# Patient Record
Sex: Female | Born: 1935
Health system: Southern US, Community
[De-identification: ages and names within clinical notes are randomized; demographics above are authoritative.]

## PROBLEM LIST (undated history)

## (undated) DIAGNOSIS — K449 Diaphragmatic hernia without obstruction or gangrene: Secondary | ICD-10-CM

## (undated) DIAGNOSIS — E785 Hyperlipidemia, unspecified: Secondary | ICD-10-CM

## (undated) DIAGNOSIS — I10 Essential (primary) hypertension: Secondary | ICD-10-CM

## (undated) DIAGNOSIS — J189 Pneumonia, unspecified organism: Secondary | ICD-10-CM

## (undated) DIAGNOSIS — Z5189 Encounter for other specified aftercare: Secondary | ICD-10-CM

## (undated) DIAGNOSIS — K5792 Diverticulitis of intestine, part unspecified, without perforation or abscess without bleeding: Secondary | ICD-10-CM

## (undated) DIAGNOSIS — M1990 Unspecified osteoarthritis, unspecified site: Secondary | ICD-10-CM

## (undated) DIAGNOSIS — C5700 Malignant neoplasm of unspecified fallopian tube: Secondary | ICD-10-CM

## (undated) DIAGNOSIS — N200 Calculus of kidney: Secondary | ICD-10-CM

## (undated) DIAGNOSIS — K760 Fatty (change of) liver, not elsewhere classified: Secondary | ICD-10-CM

## (undated) DIAGNOSIS — M5136 Other intervertebral disc degeneration, lumbar region: Secondary | ICD-10-CM

## (undated) HISTORY — DX: Calculus of kidney: N20.0

## (undated) HISTORY — PX: OTHER SURGICAL HISTORY: SHX169

## (undated) HISTORY — DX: Encounter for other specified aftercare: Z51.89

## (undated) HISTORY — DX: Fatty (change of) liver, not elsewhere classified: K76.0

## (undated) HISTORY — DX: Pneumonia, unspecified organism: J18.9

## (undated) HISTORY — DX: Unspecified osteoarthritis, unspecified site: M19.90

## (undated) HISTORY — DX: Malignant neoplasm of unspecified fallopian tube: C57.00

## (undated) HISTORY — DX: Diaphragmatic hernia without obstruction or gangrene: K44.9

## (undated) HISTORY — DX: Other intervertebral disc degeneration, lumbar region: M51.36

---

## 1999-11-01 HISTORY — PX: CHOLECYSTECTOMY: SHX55

## 2016-08-31 HISTORY — PX: ABDOMINAL HYSTERECTOMY: SHX81

## 2018-05-09 DIAGNOSIS — M5136 Other intervertebral disc degeneration, lumbar region: Secondary | ICD-10-CM

## 2018-05-09 DIAGNOSIS — M51369 Other intervertebral disc degeneration, lumbar region without mention of lumbar back pain or lower extremity pain: Secondary | ICD-10-CM

## 2018-05-09 HISTORY — DX: Other intervertebral disc degeneration, lumbar region: M51.36

## 2018-05-09 HISTORY — DX: Other intervertebral disc degeneration, lumbar region without mention of lumbar back pain or lower extremity pain: M51.369

## 2018-05-16 ENCOUNTER — Emergency Department (HOSPITAL_COMMUNITY)
Admission: EM | Admit: 2018-05-16 | Discharge: 2018-05-16 | Disposition: A | Discharge status: Home / Self Care | Payer: Medicare Other | Attending: Emergency Medicine | Admitting: Emergency Medicine

## 2018-05-16 ENCOUNTER — Encounter (HOSPITAL_COMMUNITY): Payer: Self-pay | Admitting: Emergency Medicine

## 2018-05-16 DIAGNOSIS — I10 Essential (primary) hypertension: Secondary | ICD-10-CM | POA: Insufficient documentation

## 2018-05-16 DIAGNOSIS — K5720 Diverticulitis of large intestine with perforation and abscess without bleeding: Secondary | ICD-10-CM | POA: Diagnosis present

## 2018-05-16 HISTORY — DX: Diverticulitis of intestine, part unspecified, without perforation or abscess without bleeding: K57.92

## 2018-05-16 HISTORY — DX: Hyperlipidemia, unspecified: E78.5

## 2018-05-16 HISTORY — DX: Essential (primary) hypertension: I10

## 2018-05-16 LAB — CBC
HEMATOCRIT: 36.1 % (ref 36.0–46.0)
HEMOGLOBIN: 12.2 g/dL (ref 12.0–15.0)
MCH: 32.6 pg (ref 26.0–34.0)
MCHC: 33.8 g/dL (ref 30.0–36.0)
MCV: 96.5 fL (ref 78.0–100.0)
Platelets: 277 10*3/uL (ref 150–400)
RBC: 3.74 MIL/uL — AB (ref 3.87–5.11)
RDW: 13.2 % (ref 11.5–15.5)
WBC: 6.1 10*3/uL (ref 4.0–10.5)

## 2018-05-16 LAB — COMPREHENSIVE METABOLIC PANEL
ALBUMIN: 3.9 g/dL (ref 3.5–5.0)
ALK PHOS: 59 U/L (ref 38–126)
ALT: 20 U/L (ref 0–44)
ANION GAP: 9 (ref 5–15)
AST: 27 U/L (ref 15–41)
BUN: 13 mg/dL (ref 8–23)
CALCIUM: 9.1 mg/dL (ref 8.9–10.3)
CHLORIDE: 107 mmol/L (ref 98–111)
CO2: 24 mmol/L (ref 22–32)
Creatinine, Ser: 0.8 mg/dL (ref 0.44–1.00)
GFR calc non Af Amer: >60 mL/min (ref >60)
GLUCOSE: 109 mg/dL — AB (ref 70–99)
POTASSIUM: 3.4 mmol/L — AB (ref 3.5–5.1)
SODIUM: 140 mmol/L (ref 135–145)
TOTAL PROTEIN: 7 g/dL (ref 6.5–8.1)
Total Bilirubin: 0.6 mg/dL (ref 0.3–1.2)

## 2018-05-16 LAB — LIPASE, BLOOD: LIPASE: 24 U/L (ref 11–51)

## 2018-05-16 NOTE — Discharge Instructions (Addendum)
Continue taking your antibiotics and follow-up with Dr. Dema Severin as he instructed

## 2018-05-16 NOTE — ED Triage Notes (Signed)
Patient here from home brought in by daughter with complaints of diverticulitis. States that they just [place] yesterday from [place]. Dr called about CT scan results saying that patient needed to go to an ER for abdominal abscess although it is unchanged from last time. Patient has had acute "flare ups" pf diverticulitis. Currently on antibiotics.

## 2018-05-16 NOTE — ED Provider Notes (Signed)
[facility] DEPT Provider Note   CSN: [id number] Arrival date & time: 05/16/18  3235     History   Chief Complaint Chief Complaint  Patient presents with  . Abdominal Pain    HPI WYOLENE WEIMANN is a 83 y.o. female.  83 year old female presents with known diverticular abscess.  Was hospitalized recently for similar symptoms up in [place] it was treated with IV antibiotics.  Repeat CT scan prior to discharge was performed which showed no improve meant of her abscess.  She currently denies any abdominal pain fever, vomiting.  Is currently taking antibiotics at this time.  Was told to come to the ER for further evaluation.     Past Medical History:  Diagnosis Date  . Cancer ([place])   . Diverticulitis   . Hyperlipemia   . Hypertension     There are no active problems to display for this patient.   Past Surgical History:  Procedure Laterality Date  . ABDOMINAL HYSTERECTOMY       OB History   None      Home Medications    Prior to Admission medications   Not on File    Family History No family history on file.  Social History Social History   Tobacco Use  . Smoking status: Never Smoker  . Smokeless tobacco: Never Used  Substance Use Topics  . Alcohol use: Never    Frequency: Never  . Drug use: Never     Allergies   Latex   Review of Systems Review of Systems  All other systems reviewed and are negative.    Physical Exam Updated Vital Signs BP (!) 145/85 (BP Location: Left Arm)   Pulse 86   Temp 98.1 F (36.7 C) (Oral)   Resp 20   SpO2 95%   Physical Exam  Constitutional: She is oriented to person, place, and time. She appears well-developed and well-nourished.  Non-toxic appearance. No distress.  HENT:  Head: Normocephalic and atraumatic.  Eyes: Pupils are equal, round, and reactive to light. Conjunctivae, EOM and lids are normal.  Neck: Normal range of motion. Neck supple. No tracheal  deviation present. No thyroid mass present.  Cardiovascular: Normal rate, regular rhythm and normal heart sounds. Exam reveals no gallop.  No murmur heard. Pulmonary/Chest: Effort normal and breath sounds normal. No stridor. No respiratory distress. She has no decreased breath sounds. She has no wheezes. She has no rhonchi. She has no rales.  Abdominal: Soft. Normal appearance and bowel sounds are normal. She exhibits no distension. There is no tenderness. There is no rigidity, no rebound, no guarding and no CVA tenderness.  Musculoskeletal: Normal range of motion. She exhibits no edema or tenderness.  Neurological: She is alert and oriented to person, place, and time. She has normal strength. No cranial nerve deficit or sensory deficit. GCS eye subscore is 4. GCS verbal subscore is 5. GCS motor subscore is 6.  Skin: Skin is warm and dry. No abrasion and no rash noted.  Psychiatric: She has a normal mood and affect. Her speech is normal and behavior is normal.  Nursing note and vitals reviewed.    ED Treatments / Results  Labs (all labs ordered are listed, but only abnormal results are displayed) Labs Reviewed  CBC - Abnormal; Notable for the following components:      Result Value   RBC 3.74 (*)    All other components within normal limits  LIPASE, BLOOD  COMPREHENSIVE METABOLIC PANEL  URINALYSIS,  ROUTINE W REFLEX MICROSCOPIC    EKG None  Radiology No results found.  Procedures Procedures (including critical care time)  Medications Ordered in ED Medications - No data to display   Initial Impression / Assessment and Plan / ED Course  I have reviewed the triage vital signs and the nursing notes.  Pertinent labs & imaging results that were available during my care of the patient were reviewed by me and considered in my medical decision making (see chart for details).     Lab work today is reassuring.  Discussed case with Dr. Dema Severin from general surgery who has reviewed the  patient's old CTs.  He recommends patient continuing with Cipro and Flagyl and he has arranged follow-up in the office  Final Clinical Impressions(s) / ED Diagnoses   Final diagnoses:  None    ED Discharge Orders    None       Lacretia Leigh, MD 05/16/18 1153

## 2018-05-16 NOTE — H&P (Signed)
CC: Told to report to ER for [place] scan done [place]  HPI: Lisa Moyer is an 83 y.o. female with hx of HTN, HLD was admitted to a hospital in [place]. For a hx of LLQ abodminal pain 05/09/18. Per her and her daughter's report, she was evaluated and admitted for diverticulitis with an associated 3x4cm abscess between sigmoid and proximal rectum in her pelvis. She states she received IV abx and improved. Her diet was advanced and she was discharged. A follow-up CT scan was obtained 05/14/18 in [place]. And showed persistent 2x3.5cm abscess in same location. No associated perisigmoidal inflammation or stranding. Clinically was not having any symptoms. Her doctor in [place]. Told her to report to the ER for evaluation. She moved to [facility] yesterday with her daughter. She came here today and brings CDs with her of her imaging from [place].  She denies any complaints. She has been taking cipro/flagyl as prescribed for a planned 2 week course. She denies n/v. She has been tolerating diet. She has been having flatus and moving her bowels, albeit somewhat looser. Wears a diaper for incontinence to liquid stool. Denies fever/chills.  She reports having had regular colonoscopies up to age 75; thinks she may have had a polyp or two at some point removed but always told it was small and benign.  Past Medical History:  Diagnosis Date  . Cancer ([place])   . Diverticulitis   . Hyperlipemia   . Hypertension     Past Surgical History:  Procedure Laterality Date  . ABDOMINAL HYSTERECTOMY    TAH/BSO for ovarian cancer 50moago - reportedly in remission. Did receive chemotherapy as well.  FHx: Mother had colon cancer but was cured with an operation; unclear what age.  No family history on file.  Social:  reports that she has never smoked. She has never used smokeless tobacco. She reports that she does not drink alcohol or use drugs.  Allergies:  Allergies  Allergen Reactions  . Latex     Medications: I  have reviewed the patient's current medications.  Results for orders placed or performed during the hospital encounter of 05/16/18 (from the past 48 hour(s))  Lipase, blood     Status: None   Collection Time: 05/16/18 10:14 AM  Result Value Ref Range   Lipase 24 11 - 51 U/L    Comment: Performed at [facility] [address] [place]  [id number] Comprehensive metabolic panel     Status: Abnormal   Collection Time: 05/16/18 10:14 AM  Result Value Ref Range   Sodium 140 135 - 145 mmol/L   Potassium 3.4 (L) 3.5 - 5.1 mmol/L   Chloride 107 98 - 111 mmol/L    Comment: Please note change in reference range.   CO2 24 22 - 32 mmol/L   Glucose, Bld 109 (H) 70 - 99 mg/dL    Comment: Please note change in reference range.   BUN 13 8 - 23 mg/dL    Comment: Please note change in reference range.   Creatinine, Ser 0.80 0.44 - 1.00 mg/dL   Calcium 9.1 8.9 - 10.3 mg/dL   Total Protein 7.0 6.5 - 8.1 g/dL   Albumin 3.9 3.5 - 5.0 g/dL   AST 27 15 - 41 U/L   ALT 20 0 - 44 U/L    Comment: Please note change in reference range.   Alkaline Phosphatase 59 38 - 126 U/L   Total Bilirubin 0.6 0.3 - 1.2 mg/dL  GFR calc non Af Amer >60 >60 mL/min   GFR calc Af Amer >60 >60 mL/min    Comment: (NOTE) The eGFR has been calculated using the CKD EPI equation. This calculation has not been validated in all clinical situations. eGFR's persistently <60 mL/min signify possible Chronic Kidney Disease.    Anion gap 9 5 - 15    Comment: Performed at [facility], [place] [address], [place], [place] [address]  CBC     Status: Abnormal   Collection Time: 05/16/18 10:14 AM  Result Value Ref Range   WBC 6.1 4.0 - 10.5 K/uL   RBC 3.74 (L) 3.87 - 5.11 MIL/uL   Hemoglobin 12.2 12.0 - 15.0 g/dL   HCT 36.1 36.0 - 46.0 %   MCV 96.5 78.0 - 100.0 fL   MCH 32.6 26.0 - 34.0 pg   MCHC 33.8 30.0 - 36.0 g/dL   RDW 13.2 11.5 - 15.5 %   Platelets 277 150 - 400 K/uL    Comment:  Performed at [facility], [place] [address] [place], [place] [address]    No results found.  ROS - all of the below systems have been reviewed with the patient and positives are indicated with bold text General: chills, fever or night sweats Eyes: blurry vision or double vision ENT: epistaxis or sore throat Allergy/Immunology: itchy/watery eyes or nasal congestion Hematologic/Lymphatic: bleeding problems, blood clots or swollen lymph nodes Endocrine: temperature intolerance or unexpected weight changes Breast: new or changing breast lumps or nipple discharge Resp: cough, shortness of breath, or wheezing CV: chest pain or dyspnea on exertion GI: as per HPI GU: dysuria, trouble voiding, or hematuria MSK: joint pain or joint stiffness Neuro: TIA or stroke symptoms Derm: pruritus and skin lesion changes Psych: anxiety and depression  PE Blood pressure (!) 145/85, pulse 86, temperature 98.1 F (36.7 C), temperature source Oral, resp. rate 20, SpO2 95 %. Constitutional: NAD; conversant; no deformities Eyes: Moist conjunctiva; no lid lag; anicteric; PERRL Neck: Trachea midline; no thyromegaly Lungs: Normal respiratory effort; no tactile fremitus CV: RRR; no palpable thrills; no pitting edema GI: Abd soft, NT/ND; no palpable hepatosplenomegaly. No rebound. No guarding. MSK: Normal gait; no clubbing/cyanosis Psychiatric: Appropriate affect; alert and oriented x3 Lymphatic: No palpable cervical or axillary lymphadenopathy  Results for orders placed or performed during the hospital encounter of 05/16/18 (from the past 48 hour(s))  Lipase, blood     Status: None   Collection Time: 05/16/18 10:14 AM  Result Value Ref Range   Lipase 24 11 - 51 U/L    Comment: Performed at [facility], [place] [address] [place], [place] [address]  Comprehensive metabolic panel     Status: Abnormal   Collection Time: 05/16/18 10:14 AM  Result Value Ref Range   Sodium  140 135 - 145 mmol/L   Potassium 3.4 (L) 3.5 - 5.1 mmol/L   Chloride 107 98 - 111 mmol/L    Comment: Please note change in reference range.   CO2 24 22 - 32 mmol/L   Glucose, Bld 109 (H) 70 - 99 mg/dL    Comment: Please note change in reference range.   BUN 13 8 - 23 mg/dL    Comment: Please note change in reference range.   Creatinine, Ser 0.80 0.44 - 1.00 mg/dL   Calcium 9.1 8.9 - 10.3 mg/dL   Total Protein 7.0 6.5 - 8.1 g/dL   Albumin 3.9 3.5 - 5.0 g/dL   AST 27 15 -  41 U/L   ALT 20 0 - 44 U/L    Comment: Please note change in reference range.   Alkaline Phosphatase 59 38 - 126 U/L   Total Bilirubin 0.6 0.3 - 1.2 mg/dL   GFR calc non Af Amer >60 >60 mL/min   GFR calc Af Amer >60 >60 mL/min    Comment: (NOTE) The eGFR has been calculated using the CKD EPI equation. This calculation has not been validated in all clinical situations. eGFR's persistently <60 mL/min signify possible Chronic Kidney Disease.    Anion gap 9 5 - 15    Comment: Performed at [facility], [place] [address], [place], [place] [address]  CBC     Status: Abnormal   Collection Time: 05/16/18 10:14 AM  Result Value Ref Range   WBC 6.1 4.0 - 10.5 K/uL   RBC 3.74 (L) 3.87 - 5.11 MIL/uL   Hemoglobin 12.2 12.0 - 15.0 g/dL   HCT 36.1 36.0 - 46.0 %   MCV 96.5 78.0 - 100.0 fL   MCH 32.6 26.0 - 34.0 pg   MCHC 33.8 30.0 - 36.0 g/dL   RDW 13.2 11.5 - 15.5 %   Platelets 277 150 - 400 K/uL    Comment: Performed at [facility], [place] [address], [place],  [address]    No results found.  Imaging: CT A/P 05/14/18 reviewed by myself with Dr. Kris Hartmann in radiology. Noted small abscess in the pelvis between sigmoid and rectum ~2.5 x 3cm in size. No surrounding stranding. Diverticulosis on sigmoid but no free air in mesentery or elsewhere in abdomen.  A/P: Lisa Moyer is an 83 y.o. female with resolving diverticulitis - associated small abscess but no symptoms  -She has  no abdominal pain, no tenderness on exam, has normal vitals and is afebrile. WBC 6.1. -The anatomy and physiology of the GI tract was discussed at length with the patient. The pathophysiology of diverticulitis was discussed at length with the patient and her daughter. -They reported to the ED for persistent abscess 4-5d out from a prior CT. She has no symptoms and clinically is doing well. I discussed that treatment and resolution radiographically would not be expected in such a short interval. She is reliable and is with her daughter whom is a retired Art therapist. I have recommended they continue the abx course as prescribed and monitor. ER warnings were provided. Their questions were answered to their satisfaction and they voiced understanding. They are comfortable with the plan of care -We will plan to see her back in f/u in clinic as an outpatient  Sharon Mt. Dema Severin, M.D. [facility]

## 2018-05-24 ENCOUNTER — Encounter: Payer: Self-pay | Admitting: Gastroenterology

## 2018-05-24 ENCOUNTER — Ambulatory Visit (INDEPENDENT_AMBULATORY_CARE_PROVIDER_SITE_OTHER): Payer: Medicare Other | Admitting: Family Medicine

## 2018-05-24 ENCOUNTER — Encounter: Payer: Self-pay | Admitting: Family Medicine

## 2018-05-24 ENCOUNTER — Other Ambulatory Visit: Payer: Self-pay

## 2018-05-24 VITALS — BP 126/62 | HR 90 | Temp 98.5°F | Ht 62.5 in | Wt 153.8 lb

## 2018-05-24 DIAGNOSIS — R82998 Other abnormal findings in urine: Secondary | ICD-10-CM | POA: Diagnosis not present

## 2018-05-24 DIAGNOSIS — D539 Nutritional anemia, unspecified: Secondary | ICD-10-CM | POA: Diagnosis not present

## 2018-05-24 DIAGNOSIS — R5383 Other fatigue: Secondary | ICD-10-CM

## 2018-05-24 DIAGNOSIS — K5792 Diverticulitis of intestine, part unspecified, without perforation or abscess without bleeding: Secondary | ICD-10-CM | POA: Diagnosis not present

## 2018-05-24 DIAGNOSIS — Z1211 Encounter for screening for malignant neoplasm of colon: Secondary | ICD-10-CM

## 2018-05-24 DIAGNOSIS — Z8543 Personal history of malignant neoplasm of ovary: Secondary | ICD-10-CM

## 2018-05-24 LAB — POCT CBC
Granulocyte percent: 68.1 %G (ref 37–80)
HCT, POC: 39.1 % (ref 37.7–47.9)
Hemoglobin: 12.6 g/dL (ref 12.2–16.2)
Lymph, poc: 1.6 (ref 0.6–3.4)
MCH: 31.1 pg (ref 27–31.2)
MCHC: 32.2 g/dL (ref 31.8–35.4)
MCV: 96.7 fL (ref 80–97)
MID (cbc): 0.2 (ref 0–0.9)
MPV: 7.4 fL (ref 0–99.8)
PLATELET COUNT, POC: 342 10*3/uL (ref 142–424)
POC Granulocyte: 3.8 (ref 2–6.9)
POC LYMPH PERCENT: 28 %L (ref 10–50)
POC MID %: 3.9 %M (ref 0–12)
RBC: 4.05 M/uL (ref 4.04–5.48)
RDW, POC: 14.2 %
WBC: 5.6 10*3/uL (ref 4.6–10.2)

## 2018-05-24 LAB — POCT URINALYSIS DIP (MANUAL ENTRY)
Bilirubin, UA: NEGATIVE
Glucose, UA: NEGATIVE mg/dL
Ketones, POC UA: NEGATIVE mg/dL
Nitrite, UA: NEGATIVE
RBC UA: NEGATIVE
UROBILINOGEN UA: 0.2 U/dL
pH, UA: 5.5 (ref 5.0–8.0)

## 2018-05-24 LAB — POC MICROSCOPIC URINALYSIS (UMFC): Mucus: ABSENT

## 2018-05-24 NOTE — Progress Notes (Addendum)
Subjective:  By signing my name below, I, Lisa Moyer, attest that this documentation has been prepared under the direction and in the presence of Delman Cheadle, MD. Electronically Signed: Moises Moyer, [place]. 05/24/2018 , 8:31 AM .  Patient was seen in Room 1 .   Patient ID: Lisa Moyer, female    DOB: Jul 20, 1935, 83 y.o.   MRN: [id number] Chief Complaint  Patient presents with  . Fatigue    pt recently is recovery from a flare up for diverticulitis and over the past 4 days, pt has just had such low energy mainly at night    HPI Lisa Moyer is a 83 y.o. female who presents to Primary Care at [facility] complaining of fatigue and she would like to establish care. Just 2 weeks prior, she was hospitalized x 2d for acute diverticulitis with abscess in [place] (where she was living until she moved to [place] 1 wk ago as her son lives here (though her daughter who accompanies her here today does live in [place] so pt will be traveling back frequently)). She brought with her a radiology report of the diagnostic abd/pelvix CT w/ contrast done 05/09/18 (which is also available in [place]) when she presented with 1 month of constipation with some diarrhea, and moderate abdominal pain at night. CT scan notes a thickening of sigmoid colon wall in the pelvis with a 4.1 cm transverse by 2.3 cm AP sized fluid collection contiguous with the posterior aspect of the sigmoid colon which did not contain gas.  The posterior wall of the fluid collection is contiguous with another region of the sigmoid colon.  There were mild streaky changes in the right colonic fat at the level of the fluid collection.  However there was no free air, bowel obstruction, or ileus and contrast had reached all the way to the rectum.  After IV abx, she was d/c home on 7/12 with a 14 day course Cipro 500 q12hrs (causing moderate joint pain) and metronidazole 534m [id number](causing metallic taste) which will be complete tomorrow.   Because her treating provider in [place] (surgeon Dr. PWard Chatters phone [phone] was aware of her impending move, she did have a early f/u CT 5d later (also brought report of radiology read with her) which was not significantly changed (abd/pelvic CT w/ contrast 7/15 showed that the contrast had only just reach the sigmoid colon at the time of the scan.  The fluid collection that is contiguous with the posterior aspect of the sigmoid colon was not significantly changed and measured 3.75 cm transverse by 2.1 cm AP (from 4.1 x 2.3 so TECHNICALLY slightly SMALLER and the mild pericolonic inflammatory changes the sigmoid colon level persisted.  There was a gas fluid level in the rectum but no bowel obstruction and no abnormalities or changes elsewhere on the imaging.) So [place] and general surgeon Dr. WDema Severinwas consulted who informed pt that it is not unusual not to see any sig improvement after only 5d of antibiotic treatment so advised to cont course and then f/u o/p with Dr. WDema Severinin ~ 6 wks (appt is sched for 9/6).  She notes bowel movements are returning to normal now. Her last colonoscopy was over 10 years ago; though the ER surgeon recommended colonoscopy. This was her first occurrence of diverticulitis. Family history of mother with colon cancer and father with  pancreatic cancer.   She's been feeling fatigued since her ER visit and acute diverticulitis w/ abscess diagnosis 2 weeks ago noting "feeling so tired at night where it's hard to even brush her teeth" though sleeping well and in the morning she is fine.  However, she is now napping several times a day which is highly unusual for her.  She's been drinking plenty of fluids, without urinary symptoms. On 7/10, UA had moderate WBC's but negative nitrates. WBC's confirmed on microscopy along with with red Moyer cells and bacteria seen. Urine culture grew out viridans  streptococcus.  Initial CT scan 7/10 did show a nonobstructing 3.8 mm right renal calculus.  Patient denies prior history of kidney stones.  She brought a copy of her labs done 05/10/18 in the ER (all [place] ER labs are also avail in Care Everywhere) which shows she is anemic but that this is her baseline - was at 11.3 w/ elevated MCV low 100s. No elevation of her WBCs with acute diverticulitis infection which range from 5 to 7.8, normal platelets. In ER 7/11 labs: Lipase 48, and amylase 46. BMP nml w/ K 3.7, Cr 0.79, eGFR 70, Mag nml  She also informs having history of ovarian cancer, initially diagnosed in Sept 2016. She underwent neoadjuvent chemo (reports in 2017-2018) with an initial 3 months, and then surgery with a total hysterectomy 08/2016, then another 3 months of chemo. She sees her oncologist Dr. Ilona Sorrel and surgeon at the [facility] every 3-4 months, next appointment Sept 6th (phone [phone]). CA-125 has been monitored frequently and she brought a copy of these labs: At time of diagnosis Sept 2017, CA-125 was 351. During treatment 1-3 months later, it decreased to 22 and then within 3 months of diagnosis, was down to 7, where it has remained consistently at ranging 5-7 until 6/5 which had increased to 10, and 7/10 to 23 which is why she is being seen again in 2 mo  Her PCP is internist Dr. Talmadge Coventry (not on Epic; phone [phone]). She states she hasn't seen this PCP in a long time, so no other recent lab work through him.  She is unaware of any vitamin deficiency responsible for her anemia (takes vit C 500 qd, vit E, and Ca supp), doesn't think it's been checked. No prior thyroid, or vitamin levels including iron in labs.  Only chronic meds she takes are losartan 50 and crestor 10 which she has been on for a very long time and HTN and HLD are well controlled.  She mentions having history of pneumonia; currently no chest symptoms or cough though she was recently seen  at [place] ago for right ear ache, and was diagnosed seasonal allergies. She notes the ear ache as improved, on Zyrtec and Flonase.   She moved here from [place] just 1 week ago. Her son's family lives in [place]. Her daughter lives in [place], [place] and is here with her today.  She will be traveling back to [place] and staying with her daughter whenever she has an appointment with her oncologist  Past Medical History:  Diagnosis Date  . Cancer ([place])   . Diverticulitis   . Diverticulitis   . Fallopian tube cancer, carcinoma ([place])   . Hyperlipemia   . Hypertension    Past Surgical History:  Procedure Laterality Date  . ABDOMINAL HYSTERECTOMY    . CHOLECYSTECTOMY    . OTHER SURGICAL HISTORY  2017-2018   Chemotherapy  Prior to Admission medications   Medication Sig Start Date End Date Taking? Authorizing Provider  Ascorbic Acid (VITAMIN C PO) Take 1 tablet by mouth daily.    [provider]  CALCIUM PO Take 1 tablet by mouth daily.    [provider]  Cholecalciferol (VITAMIN D PO) Take 1 tablet by mouth daily.    [provider]  ciprofloxacin (CIPRO) 500 MG tablet Take 500 mg by mouth 2 (two) times daily.    [provider]  ibuprofen (ADVIL,MOTRIN) 200 MG tablet Take 200 mg by mouth at bedtime as needed (sleep).    [provider]  losartan (COZAAR) 50 MG tablet Take 50 mg by mouth daily.    [provider]  metroNIDAZOLE (FLAGYL) 500 MG tablet Take 500 mg by mouth 3 (three) times daily.    [provider]  rosuvastatin (CRESTOR) 10 MG tablet Take 10 mg by mouth daily.    [provider]   Allergies  Allergen Reactions  . Latex    Family History  Problem Relation Age of Onset  . Cancer Mother   . Cancer Father    Social History   Socioeconomic History  . Marital status: Widowed    Spouse name: Not on file  . Number of children: Not on file  . Years of education: Not on file  .  Highest education level: Not on file  Occupational History  . Not on file  Social Needs  . Financial resource strain: Not on file  . Food insecurity:    Worry: Not on file    Inability: Not on file  . Transportation needs:    Medical: Not on file    Non-medical: Not on file  Tobacco Use  . Smoking status: Never Smoker  . Smokeless tobacco: Never Used  Substance and Sexual Activity  . Alcohol use: Never    Frequency: Never  . Drug use: Never  . Sexual activity: Not on file  Lifestyle  . Physical activity:    Days per week: Not on file    Minutes per session: Not on file  . Stress: Not on file  Relationships  . Social connections:    Talks on phone: Not on file    Gets together: Not on file    Attends religious service: Not on file    Active member of club or organization: Not on file    Attends meetings of clubs or organizations: Not on file    Relationship status: Not on file  Other Topics Concern  . Not on file  Social History Narrative  . Not on file   Depression screen [facility] 2/9 05/24/2018  Decreased Interest 0  Down, Depressed, Hopeless 0  PHQ - 2 Score 0    Review of Systems  Constitutional: Positive for fatigue. Negative for chills, fever and unexpected weight change.  HENT: Positive for ear pain (improved).   Respiratory: Negative for cough.   Gastrointestinal: Negative for constipation, diarrhea, nausea and vomiting.  Skin: Negative for rash and wound.  Neurological: Negative for dizziness, weakness and headaches.       Objective:   Physical Exam  Constitutional: She is oriented to person, place, and time. She appears well-developed and well-nourished. No distress.  HENT:  Head: Normocephalic and atraumatic.  Right Ear: Tympanic membrane normal.  Left Ear: Tympanic membrane normal.  Nose: Nose normal.  Post oropharynx with postnasal drip Hearing aids bilaterally  Eyes: Pupils are equal, round, and reactive to light. EOM  are normal.  Neck: Neck  supple. No thyromegaly present.  Cardiovascular: Normal rate, regular rhythm, S1 normal, S2 normal and normal heart sounds. Exam reveals no gallop and no friction rub.  No murmur heard. Pulmonary/Chest: Effort normal. No respiratory distress.  Abdominal: Bowel sounds are normal. There is no CVA tenderness.  Musculoskeletal: Normal range of motion.  Neurological: She is alert and oriented to person, place, and time.  Skin: Skin is warm and dry.  Psychiatric: She has a normal mood and affect. Her behavior is normal.  Nursing note and vitals reviewed.   BP 126/62 (BP Location: Right Arm, Patient Position: Sitting, Cuff Size: Normal)   Pulse 90   Temp 98.5 F (36.9 C) (Oral)   Ht 5' 2.5" (1.588 m)   Wt 153 lb 12.8 oz (69.8 kg)   SpO2 95%   BMI 27.68 kg/m   Results for orders placed or performed in visit on 05/24/18  POCT CBC  Result Value Ref Range   WBC 5.6 4.6 - 10.2 K/uL   Lymph, poc 1.6 0.6 - 3.4   POC LYMPH PERCENT 28.0 10 - 50 %L   MID (cbc) 0.2 0 - 0.9   POC MID % 3.9 0 - 12 %M   POC Granulocyte 3.8 2 - 6.9   Granulocyte percent 68.1 37 - 80 %G   RBC 4.05 4.04 - 5.48 M/uL   Hemoglobin 12.6 12.2 - 16.2 g/dL   HCT, POC 39.1 37.7 - 47.9 %   MCV 96.7 80 - 97 fL   MCH, POC 31.1 27 - 31.2 pg   MCHC 32.2 31.8 - 35.4 g/dL   RDW, POC 14.2 %   Platelet Count, POC 342 142 - 424 K/uL   MPV 7.4 0 - 99.8 fL  POCT urinalysis dipstick  Result Value Ref Range   Color, UA yellow yellow   Clarity, UA clear clear   Glucose, UA negative negative mg/dL   Bilirubin, UA negative negative   Ketones, POC UA negative negative mg/dL   Spec Grav, UA >=1.030 (A) 1.010 - 1.025   Moyer, UA negative negative   pH, UA 5.5 5.0 - 8.0   Protein Ur, POC trace (A) negative mg/dL   Urobilinogen, UA 0.2 0.2 or 1.0 E.U./dL   Nitrite, UA Negative Negative   Leukocytes, UA Trace (A) Negative  POCT Microscopic Urinalysis ([facility])  Result Value Ref Range   WBC,UR,HPF,POC Moderate (A) None WBC/hpf    RBC,UR,HPF,POC None None RBC/hpf   Bacteria None None, Too numerous to count   Mucus Absent Absent   Epithelial Cells, UR Per Microscopy Moderate (A) None, Too numerous to count cells/hpf       Assessment & Plan:   1. Other fatigue - x 2 wks since diagnosis with acute diverticulitis w/ abscess but sxs improving and also had major move from [place] to [place] 1 wk prior so likely just combo of sig acute infection, major life change, and has felt compelled to physically do more than a person who was just recently hospitalized. Prior labs all at baseline but she does have a macrocytic anemia of unknown etiology so check to see if vit deficiency could be causing/contributing.  As long as labs are ok, ok to do some cautious watchful waiting at this point and RTC for further eval if sxs not improving or GI sxs worsening.  2. Acute diverticulitis - hosp 7/10-12 in [place] then completing 2 wk course of cipro and flagyl tomorrow with sxs almost resolved. Has f/u  appt w/ gen surg Dr. Dema Severin 9/6 to recheck on asstd abscess Fortunately - pt's prior hospitalization (inc imaging and labs as well as f/u CT 5d later) is avail for review in Care Everywhere  3. Macrocytic anemia - check vit labs  4. Urine WBC increased - clx  5. Ovarian cancer in remission - fallopian tube - recent CA-125 2 mos ago increased a little (to 23 from prior baseline 5-7 (was 351 at time of diagnosis)  but perhaps was diverticulitis starting as has had the GI sxs x 1 mo prior to diagnosis - pt is concerned about this - her next appt with oncologist in [place] is in 6 wks but she would like to go ahead and have this rechecked today  6. Screening for colon cancer - has been told she needs colonoscopy due to recent diverticulitis and colon cancer in father so refer to GI    Orders Placed This Encounter  Procedures  . Urine Culture  . Comprehensive metabolic panel  . B12 and Folate Panel  . CA 125, Serum (Serial)  . Iron, TIBC and Ferritin Panel  .  Ambulatory referral to Gastroenterology    Referral Priority:   Routine    Referral Type:   Consultation    Referral Reason:   Specialty Services Required    Number of Visits Requested:   1  . POCT CBC  . POCT urinalysis dipstick  . POCT Microscopic Urinalysis ([facility])  . POC Hemoccult Bld/Stl (3-Cd Home Screen)    Standing Status:   Future    Number of Occurrences:   1    Standing Expiration Date:   05/25/2019    I personally performed the services described in this documentation, which was scribed in my presence. The recorded information has been reviewed and considered, and addended by me as needed.   Delman Cheadle, M.D.  Primary Care at [facility] [address] [place], [place] [address] [phone] phone [phone] fax  06/27/18 3:25 PM

## 2018-05-24 NOTE — Patient Instructions (Addendum)
IF you received an x-ray today, you will receive an invoice from [facility]. Please contact [facility] at [phone] with questions or concerns regarding your invoice.   IF you received labwork today, you will receive an invoice from [place]. Please contact [facility] at [phone] with questions or concerns regarding your invoice.   Our billing staff will not be able to assist you with questions regarding bills from these companies.  You will be contacted with the lab results as soon as they are available. The fastest way to get your results is to activate your My Chart account. Instructions are located on the last page of this paperwork. If you have not heard from [place] regarding the results in 2 weeks, please contact this office.      Fatigue Fatigue is feeling tired all of the time, a lack of energy, or a lack of motivation. Occasional or mild fatigue is often a normal response to activity or life in general. However, long-lasting (chronic) or extreme fatigue may indicate an underlying medical condition. Follow these instructions at home: Watch your fatigue for any changes. The following actions may help to lessen any discomfort you are feeling:  Talk to your health care provider about how much sleep you need each night. Try to get the required amount every night.  Take medicines only as directed by your health care provider.  Eat a healthy and nutritious diet. Ask your health care provider if you need help changing your diet.  Drink enough fluid to keep your urine clear or pale yellow.  Practice ways of relaxing, such as yoga, meditation, massage therapy, or acupuncture.  Exercise regularly.  Change situations that cause you stress. Try to keep your work and personal routine reasonable.  Do not abuse illegal drugs.  Limit alcohol intake to no more than 1 drink per day for nonpregnant women and 2 drinks per day for men. One drink equals 12 ounces of  beer, 5 ounces of wine, or 1 ounces of hard liquor.  Take a multivitamin, if directed by your health care provider.  Contact a health care provider if:  Your fatigue does not get better.  You have a fever.  You have unintentional weight loss or gain.  You have headaches.  You have difficulty: ? Falling asleep. ? Sleeping throughout the night.  You feel angry, guilty, anxious, or sad.  You are unable to have a bowel movement (constipation).  You skin is dry.  Your legs or another part of your body is swollen. Get help right away if:  You feel confused.  Your vision is blurry.  You feel faint or pass out.  You have a severe headache.  You have severe abdominal, pelvic, or back pain.  You have chest pain, shortness of breath, or an irregular or fast heartbeat.  You are unable to urinate or you urinate less than normal.  You develop abnormal bleeding, such as bleeding from the rectum, vagina, nose, lungs, or nipples.  You vomit blood.  You have thoughts about harming yourself or committing suicide.  You are worried that you might harm someone else. This information is not intended to replace advice given to you by your health care provider. Make sure you discuss any questions you have with your health care provider. Document Released: 08/14/2007 Document Revised: 03/24/2016 Document Reviewed: 02/18/2014 Elsevier Interactive Patient Education  2018 [employer].  Diverticulitis Diverticulitis is infection or inflammation of small pouches (diverticula) in the colon that form due  to a condition called diverticulosis. Diverticula can trap stool (feces) and bacteria, causing infection and inflammation. Diverticulitis may cause severe stomach pain and diarrhea. It may lead to tissue damage in the colon that causes bleeding. The diverticula may also burst (rupture) and cause infected stool to enter other areas of the abdomen. Complications of diverticulitis can  include:  Bleeding.  Severe infection.  Severe pain.  Rupture (perforation) of the colon.  Blockage (obstruction) of the colon.  What are the causes? This condition is caused by stool becoming trapped in the diverticula, which allows bacteria to grow in the diverticula. This leads to inflammation and infection. What increases the risk? You are more likely to develop this condition if:  You have diverticulosis. The risk for diverticulosis increases if: ? You are overweight or obese. ? You use tobacco products. ? You do not get enough exercise.  You eat a diet that does not include enough fiber. High-fiber foods include fruits, vegetables, beans, nuts, and whole grains.  What are the signs or symptoms? Symptoms of this condition may include:  Pain and tenderness in the abdomen. The pain is normally located on the left side of the abdomen, but it may occur in other areas.  Fever and chills.  Bloating.  Cramping.  Nausea.  Vomiting.  Changes in bowel routines.  Blood in your stool.  How is this diagnosed? This condition is diagnosed based on:  Your medical history.  A physical exam.  Tests to make sure there is nothing else causing your condition. These tests may include: ? Blood tests. ? Urine tests. ? Imaging tests of the abdomen, including X-rays, ultrasounds, MRIs, or CT scans.  How is this treated? Most cases of this condition are mild and can be treated at home. Treatment may include:  Taking over-the-counter pain medicines.  Following a clear liquid diet.  Taking antibiotic medicines by mouth.  Rest.  More severe cases may need to be treated at a hospital. Treatment may include:  Not eating or drinking.  Taking prescription pain medicine.  Receiving antibiotic medicines through an IV tube.  Receiving fluids and nutrition through an IV tube.  Surgery.  When your condition is under control, your health care provider may recommend that you  have a colonoscopy. This is an exam to look at the entire large intestine. During the exam, a lubricated, bendable tube is inserted into the anus and then passed into the rectum, colon, and other parts of the large intestine. A colonoscopy can show how severe your diverticula are and whether something else may be causing your symptoms. Follow these instructions at home: Medicines  Take over-the-counter and prescription medicines only as told by your health care provider. These include fiber supplements, probiotics, and stool softeners.  If you were prescribed an antibiotic medicine, take it as told by your health care provider. Do not stop taking the antibiotic even if you start to feel better.  Do not drive or use heavy machinery while taking prescription pain medicine. General instructions  Follow a full liquid diet or another diet as directed by your health care provider. After your symptoms improve, your health care provider may tell you to change your diet. He or she may recommend that you eat a diet that contains at least 25 g (25 grams) of fiber daily. Fiber makes it easier to pass stool. Healthy sources of fiber include: ? Berries. One cup contains 4-8 grams of fiber. ? Beans or lentils. One half cup contains 5-8 grams  of fiber. ? Green vegetables. One cup contains 4 grams of fiber.  Exercise for at least 30 minutes, 3 times each week. You should exercise hard enough to raise your heart rate and break a sweat.  Keep all follow-up visits as told by your health care provider. This is important. You may need a colonoscopy. Contact a health care provider if:  Your pain does not improve.  You have a hard time drinking or eating food.  Your bowel movements do not return to normal. Get help right away if:  Your pain gets worse.  Your symptoms do not get better with treatment.  Your symptoms suddenly get worse.  You have a fever.  You vomit more than one time.  You have stools  that are bloody, black, or tarry. Summary  Diverticulitis is infection or inflammation of small pouches (diverticula) in the colon that form due to a condition called diverticulosis. Diverticula can trap stool (feces) and bacteria, causing infection and inflammation.  You are at higher risk for this condition if you have diverticulosis and you eat a diet that does not include enough fiber.  Most cases of this condition are mild and can be treated at home. More severe cases may need to be treated at a hospital.  When your condition is under control, your health care provider may recommend that you have an exam called a colonoscopy. This exam can show how severe your diverticula are and whether something else may be causing your symptoms. This information is not intended to replace advice given to you by your health care provider. Make sure you discuss any questions you have with your health care provider. Document Released: 07/27/2005 Document Revised: 11/19/2016 Document Reviewed: 11/19/2016 Elsevier Interactive Patient Education  [employer].

## 2018-05-25 LAB — IRON,TIBC AND FERRITIN PANEL
FERRITIN: 95 ng/mL (ref 15–150)
Iron Saturation: 31 % (ref 15–55)
Iron: 71 ug/dL (ref 27–139)
TIBC: 228 ug/dL — AB (ref 250–450)
UIBC: 157 ug/dL (ref 118–369)

## 2018-05-25 LAB — B12 AND FOLATE PANEL
Folate: 9.6 ng/mL (ref >3.0)
VITAMIN B 12: 429 pg/mL (ref 232–1245)

## 2018-05-25 LAB — COMPREHENSIVE METABOLIC PANEL
ALBUMIN: 4.2 g/dL (ref 3.5–4.7)
ALT: 19 IU/L (ref 0–32)
AST: 31 IU/L (ref 0–40)
Albumin/Globulin Ratio: 1.6 (ref 1.2–2.2)
Alkaline Phosphatase: 62 IU/L (ref 39–117)
BUN/Creatinine Ratio: 23 (ref 12–28)
BUN: 18 mg/dL (ref 8–27)
Bilirubin Total: 0.2 mg/dL (ref 0.0–1.2)
CALCIUM: 9.3 mg/dL (ref 8.7–10.3)
CO2: 20 mmol/L (ref 20–29)
Chloride: 104 mmol/L (ref 96–106)
Creatinine, Ser: 0.77 mg/dL (ref 0.57–1.00)
GFR, EST AFRICAN AMERICAN: 84 mL/min/{1.73_m2} (ref >59)
GFR, EST NON AFRICAN AMERICAN: 73 mL/min/{1.73_m2} (ref >59)
GLOBULIN, TOTAL: 2.6 g/dL (ref 1.5–4.5)
Glucose: 108 mg/dL — ABNORMAL HIGH (ref 65–99)
Potassium: 3.6 mmol/L (ref 3.5–5.2)
Sodium: 142 mmol/L (ref 134–144)
TOTAL PROTEIN: 6.8 g/dL (ref 6.0–8.5)

## 2018-05-25 LAB — CA 125, SERUM (SERIAL): CANCER ANTIGEN (CA) 125: 27 U/mL (ref 0.0–38.1)

## 2018-05-25 LAB — URINE CULTURE

## 2018-05-28 LAB — POC HEMOCCULT BLD/STL (HOME/3-CARD/SCREEN)
Card #2 Fecal Occult Blod, POC: NEGATIVE
FECAL OCCULT BLD: NEGATIVE
Fecal Occult Blood, POC: NEGATIVE

## 2018-05-31 DIAGNOSIS — N200 Calculus of kidney: Secondary | ICD-10-CM

## 2018-05-31 HISTORY — DX: Calculus of kidney: N20.0

## 2018-06-27 ENCOUNTER — Encounter: Payer: Self-pay | Admitting: Family Medicine

## 2018-06-27 ENCOUNTER — Ambulatory Visit (INDEPENDENT_AMBULATORY_CARE_PROVIDER_SITE_OTHER): Payer: Medicare Other | Admitting: Family Medicine

## 2018-06-27 ENCOUNTER — Other Ambulatory Visit: Payer: Self-pay

## 2018-06-27 VITALS — BP 130/82 | HR 95 | Temp 98.8°F | Resp 16 | Ht 62.99 in | Wt 149.0 lb

## 2018-06-27 DIAGNOSIS — K5732 Diverticulitis of large intestine without perforation or abscess without bleeding: Secondary | ICD-10-CM | POA: Diagnosis not present

## 2018-06-27 DIAGNOSIS — R1032 Left lower quadrant pain: Secondary | ICD-10-CM

## 2018-06-27 LAB — POCT CBC
Granulocyte percent: 61.2 %G (ref 37–80)
HEMATOCRIT: 37.1 % — AB (ref 37.7–47.9)
HEMOGLOBIN: 11.7 g/dL — AB (ref 12.2–16.2)
LYMPH, POC: 1.8 (ref 0.6–3.4)
MCH: 29.7 pg (ref 27–31.2)
MCHC: 31.5 g/dL — AB (ref 31.8–35.4)
MCV: 94.4 fL (ref 80–97)
MID (cbc): 0.4 (ref 0–0.9)
MPV: 6.8 fL (ref 0–99.8)
POC GRANULOCYTE: 3.5 (ref 2–6.9)
POC LYMPH PERCENT: 31.6 %L (ref 10–50)
POC MID %: 7.2 %M (ref 0–12)
Platelet Count, POC: 317 10*3/uL (ref 142–424)
RBC: 3.93 M/uL — AB (ref 4.04–5.48)
RDW, POC: 13.5 %
WBC: 5.8 10*3/uL (ref 4.6–10.2)

## 2018-06-27 LAB — BASIC METABOLIC PANEL
BUN / CREAT RATIO: 23 (ref 12–28)
BUN: 17 mg/dL (ref 8–27)
CHLORIDE: 100 mmol/L (ref 96–106)
CO2: 22 mmol/L (ref 20–29)
Calcium: 9.8 mg/dL (ref 8.7–10.3)
Creatinine, Ser: 0.75 mg/dL (ref 0.57–1.00)
GFR calc Af Amer: 86 mL/min/{1.73_m2} (ref >59)
GFR calc non Af Amer: 75 mL/min/{1.73_m2} (ref >59)
GLUCOSE: 92 mg/dL (ref 65–99)
POTASSIUM: 4.6 mmol/L (ref 3.5–5.2)
SODIUM: 139 mmol/L (ref 134–144)

## 2018-06-27 LAB — POCT URINALYSIS DIP (MANUAL ENTRY)
BILIRUBIN UA: NEGATIVE
Glucose, UA: NEGATIVE mg/dL
Nitrite, UA: NEGATIVE
Protein Ur, POC: NEGATIVE mg/dL
RBC UA: NEGATIVE
SPEC GRAV UA: 1.025 (ref 1.010–1.025)
Urobilinogen, UA: 0.2 E.U./dL
pH, UA: 5 (ref 5.0–8.0)

## 2018-06-27 MED ORDER — AMOXICILLIN-POT CLAVULANATE 875-125 MG PO TABS: 1.0000 | ORAL_TABLET | Freq: Two times a day (BID) | ORAL | 0 refills | Status: DC

## 2018-06-27 NOTE — Patient Instructions (Addendum)
Stop by radiology department at [facility] to pick up the contrast so you can start drinking at home. Go to [facility] radiology department tomorrow Thurs 8/29 by 10:15 for your 10:30 am appt for the CT scan.  If you have lab work done today you will be contacted with your lab results within the next 2 weeks.  If you have not heard from [place] then please contact us. The fastest way to get your results is to register for My Chart.   IF you received an x-ray today, you will receive an invoice from [facility]. Please contact [facility] at [phone] with questions or concerns regarding your invoice.   IF you received labwork today, you will receive an invoice from [place]. Please contact [facility] at [phone] with questions or concerns regarding your invoice.   Our billing staff will not be able to assist you with questions regarding bills from these companies.  You will be contacted with the lab results as soon as they are available. The fastest way to get your results is to activate your My Chart account. Instructions are located on the last page of this paperwork. If you have not heard from [place] regarding the results in 2 weeks, please contact this office.    Low-Fiber Diet Fiber is found in fruits, vegetables, and whole grains. A low-fiber diet restricts fibrous foods that are not digested in the small intestine. A diet containing about 10-15 grams of fiber per day is considered low fiber. Low-fiber diets may be used to:  Promote healing and rest the bowel during intestinal flare-ups.  Prevent blockage of a partially obstructed or narrowed gastrointestinal tract.  Reduce fecal weight and volume.  Slow the movement of feces.  You may be on a low-fiber diet as a transitional diet following surgery, after an injury (trauma), or because of a short (acute) or lifelong (chronic) illness. Your health care provider will determine the length of time you need to  stay on this diet. What do I need to know about a low-fiber diet? Always check the fiber content on the packaging's Nutrition Facts label, especially on foods from the grains list. Ask your dietitian if you have questions about specific foods that are related to your condition, especially if the food is not listed below. In general, a low-fiber food will have less than 2 g of fiber. What foods can I eat? Grains All breads and crackers made with white flour. Sweet rolls, doughnuts, waffles, pancakes, [place] toast, bagels. Pretzels, Melba toast, zwieback. Well-cooked cereals, such as cornmeal, farina, or cream cereals. Dry cereals that do not contain whole grains, fruit, or nuts, such as refined corn, wheat, rice, and oat cereals. Potatoes prepared any way without skins, plain pastas and noodles, refined white rice. Use white flour for baking and making sauces. Use allowed list of grains for casseroles, dumplings, and puddings. Vegetables Strained tomato and vegetable juices. Fresh lettuce, cucumber, spinach. Well-cooked (no skin or pulp) or canned vegetables, such as asparagus, bean sprouts, beets, carrots, green beans, mushrooms, potatoes, pumpkin, spinach, yellow squash, tomato sauce/puree, turnips, yams, and zucchini. Keep servings limited to  cup. Fruits All fruit juices except prune juice. Cooked or canned fruits without skin and seeds, such as applesauce, apricots, cherries, fruit cocktail, grapefruit, grapes, mandarin oranges, melons, peaches, pears, pineapple, and plums. Fresh fruits without skin, such as apricots, avocados, bananas, melons, pineapple, nectarines, and peaches. Keep servings limited to  cup or 1 piece. Meat and Other Protein Sources  Ground or well-cooked tender beef, ham, veal, lamb, pork, or poultry. Eggs, plain cheese. Fish, oysters, shrimp, lobster, and other seafood. Liver, organ meats. Smooth nut butters. Dairy All milk products and alternative dairy substitutes, such as  soy, rice, almond, and coconut, not containing added whole nuts, seeds, or added fruit. Beverages Decaf coffee, fruit, and vegetable juices or smoothies (small amounts, with no pulp or skins, and with fruits from allowed list), sports drinks, herbal tea. Condiments Ketchup, mustard, vinegar, cream sauce, cheese sauce, cocoa powder. Spices in moderation, such as allspice, basil, bay leaves, celery powder or leaves, cinnamon, cumin powder, curry powder, ginger, mace, marjoram, onion or garlic powder, oregano, paprika, parsley flakes, ground pepper, rosemary, sage, savory, tarragon, thyme, and turmeric. Sweets and Desserts Plain cakes and cookies, pie made with allowed fruit, pudding, custard, cream pie. Gelatin, fruit, ice, sherbet, frozen ice pops. Ice cream, ice milk without nuts. Plain hard candy, honey, jelly, molasses, syrup, sugar, chocolate syrup, gumdrops, marshmallows. Limit overall sugar intake. Fats and Oil Margarine, butter, cream, mayonnaise, salad oils, plain salad dressings made from allowed foods. Choose healthy fats such as olive oil, canola oil, and omega-3 fatty acids (such as found in salmon or tuna) when possible. Other Bouillon, broth, or cream soups made from allowed foods. Any strained soup. Casseroles or mixed dishes made with allowed foods. The items listed above may not be a complete list of recommended foods or beverages. Contact your dietitian for more options. What foods are not recommended? Grains All whole wheat and whole grain breads and crackers. Multigrains, rye, bran seeds, nuts, or coconut. Cereals containing whole grains, multigrains, bran, coconut, nuts, raisins. Cooked or dry oatmeal, steel-cut oats. Coarse wheat cereals, granola. Cereals advertised as high fiber. Potato skins. Whole grain pasta, wild or brown rice. Popcorn. Coconut flour. Bran, buckwheat, corn bread, multigrains, rye, wheat germ. Vegetables Fresh, cooked or canned vegetables, such as artichokes,  asparagus, beet greens, broccoli, Brussels sprouts, cabbage, celery, cauliflower, corn, eggplant, kale, legumes or beans, okra, peas, and tomatoes. Avoid large servings of any vegetables, especially raw vegetables. Fruits Fresh fruits, such as apples with or without skin, berries, cherries, figs, grapes, grapefruit, guavas, kiwis, mangoes, oranges, papayas, pears, persimmons, pineapple, and pomegranate. Prune juice and juices with pulp, stewed or dried prunes. Dried fruits, dates, raisins. Fruit seeds or skins. Avoid large servings of all fresh fruits. Meats and Other Protein Sources Tough, fibrous meats with gristle. Chunky nut butter. Cheese made with seeds, nuts, or other foods not recommended. Nuts, seeds, legumes (beans, including baked beans), dried peas, beans, lentils. Dairy Yogurt or cheese that contains nuts, seeds, or added fruit. Beverages Fruit juices with high pulp, prune juice. Caffeinated coffee and teas. Condiments Coconut, maple syrup, pickles, olives. Sweets and Desserts Desserts, cookies, or candies that contain nuts or coconut, chunky peanut butter, dried fruits. Jams, preserves with seeds, marmalade. Large amounts of sugar and sweets. Any other dessert made with fruits from the not recommended list. Other Soups made from vegetables that are not recommended or that contain other foods not recommended. The items listed above may not be a complete list of foods and beverages to avoid. Contact your dietitian for more information. This information is not intended to replace advice given to you by your health care provider. Make sure you discuss any questions you have with your health care provider. Document Released: 04/08/2002 Document Revised: 03/24/2016 Document Reviewed: 09/09/2013 Elsevier Interactive Patient Education  2017 [employer].

## 2018-06-27 NOTE — Progress Notes (Signed)
Subjective:    Patient ID: Lisa Moyer, female    DOB: 1935-09-21, 83 y.o.   MRN: [id number]  HPI Had thrush which has been treated. No stomach.  Energy level better now that it is not as hot. Exercising daily. Settling in.  Going back to [place] on Friday to f/u w/ surgeon. Still having tenderness when she pressed and light pain - normal appetite.  Stooling every day, normal.  Today she took an ibuprofen for arthritis in her Left foot and feels really good today.   Past Medical History:  Diagnosis Date  . Cancer ([place])   . Diverticulitis   . Diverticulitis   . Fallopian tube cancer, carcinoma ([place])   . Hyperlipemia   . Hypertension    Past Surgical History:  Procedure Laterality Date  . ABDOMINAL HYSTERECTOMY    . CHOLECYSTECTOMY    . OTHER SURGICAL HISTORY  2017-2018   Chemotherapy    Current Outpatient Medications on File Prior to Visit  Medication Sig Dispense Refill  . Ascorbic Acid (VITAMIN C PO) Take 1 tablet by mouth daily.    Marland Kitchen CALCIUM PO Take 1 tablet by mouth daily.    . Cholecalciferol (VITAMIN D PO) Take 1 tablet by mouth daily.    Marland Kitchen ibuprofen (ADVIL,MOTRIN) 200 MG tablet Take 200 mg by mouth at bedtime as needed (sleep).    . losartan (COZAAR) 50 MG tablet Take 50 mg by mouth daily.    . rosuvastatin (CRESTOR) 10 MG tablet Take 10 mg by mouth daily.     No current facility-administered medications on file prior to visit.    Allergies  Allergen Reactions  . Latex    Family History  Problem Relation Age of Onset  . Cancer Mother   . Cancer Father    Social History   Socioeconomic History  . Marital status: Widowed    Spouse name: Not on file  . Number of children: Not on file  . Years of education: Not on file  . Highest education level: Not on file  Occupational History  . Not on file  Social Needs  . Financial resource strain: Not on file  . Food insecurity:    Worry: Not on file    Inability: Not on file  . Transportation needs:   Medical: Not on file    Non-medical: Not on file  Tobacco Use  . Smoking status: Never Smoker  . Smokeless tobacco: Never Used  Substance and Sexual Activity  . Alcohol use: Never    Frequency: Never  . Drug use: Never  . Sexual activity: Not on file  Lifestyle  . Physical activity:    Days per week: Not on file    Minutes per session: Not on file  . Stress: Not on file  Relationships  . Social connections:    Talks on phone: Not on file    Gets together: Not on file    Attends religious service: Not on file    Active member of club or organization: Not on file    Attends meetings of clubs or organizations: Not on file    Relationship status: Not on file  Other Topics Concern  . Not on file  Social History Narrative  . Not on file   Depression screen [facility] 2/9 06/27/2018 05/24/2018  Decreased Interest 0 0  Down, Depressed, Hopeless 0 0  PHQ - 2 Score 0 0       Review of Systems See hpi  Objective:   Physical Exam  Constitutional: She is oriented to person, place, and time. She appears well-developed and well-nourished. No distress.  HENT:  Head: Normocephalic and atraumatic.  Neck: Normal range of motion. Neck supple. No thyromegaly present.  Cardiovascular: Normal rate, regular rhythm, normal heart sounds and intact distal pulses.  Pulmonary/Chest: Effort normal and breath sounds normal. No respiratory distress.  Abdominal: Soft. Bowel sounds are normal. There is tenderness in the suprapubic area and left lower quadrant. There is no rebound, no guarding and no CVA tenderness.  Musculoskeletal: She exhibits no edema.  Lymphadenopathy:    She has no cervical adenopathy.  Neurological: She is alert and oriented to person, place, and time.  Skin: Skin is warm and dry. She is not diaphoretic. No erythema.  Psychiatric: She has a normal mood and affect. Her behavior is normal.   BP 130/82   Pulse 95   Temp 98.8 F (37.1 C) (Oral)   Resp 16   Ht 5' 2.99" (1.6 m)    Wt 149 lb (67.6 kg)   SpO2 97%   BMI 26.40 kg/m   Results for orders placed or performed in visit on 06/27/18  POCT urinalysis dipstick  Result Value Ref Range   Color, UA yellow yellow   Clarity, UA clear clear   Glucose, UA negative negative mg/dL   Bilirubin, UA negative negative   Ketones, POC UA trace (5) (A) negative mg/dL   Spec Grav, UA 1.025 1.010 - 1.025   Blood, UA negative negative   pH, UA 5.0 5.0 - 8.0   Protein Ur, POC negative negative mg/dL   Urobilinogen, UA 0.2 0.2 or 1.0 E.U./dL   Nitrite, UA Negative Negative   Leukocytes, UA Small (1+) (A) Negative  POCT CBC  Result Value Ref Range   WBC 5.8 4.6 - 10.2 K/uL   Lymph, poc 1.8 0.6 - 3.4   POC LYMPH PERCENT 31.6 10 - 50 %L   MID (cbc) 0.4 0 - 0.9   POC MID % 7.2 0 - 12 %M   POC Granulocyte 3.5 2 - 6.9   Granulocyte percent 61.2 37 - 80 %G   RBC 3.93 (A) 4.04 - 5.48 M/uL   Hemoglobin 11.7 (A) 12.2 - 16.2 g/dL   HCT, POC 37.1 (A) 37.7 - 47.9 %   MCV 94.4 80 - 97 fL   MCH, POC 29.7 27 - 31.2 pg   MCHC 31.5 (A) 31.8 - 35.4 g/dL   RDW, POC 13.5 %   Platelet Count, POC 317 142 - 424 K/uL   MPV 6.8 0 - 99.8 fL      At her last CT scan 05/14/18 at [facility] there was noted a 3.75 transverse x 2.1 cm AP dimension fluid collection contiguous with the posterior aspect of the sigmoid colon, not significantly changed from the prior CT scan Assessment & Plan:   1. Abdominal pain, left lower quadrant   2. Left lower quadrant pain   3. Diverticulitis of colon    Pt asked me to call the CT results to her daughter henry demeritt [phone] - get CT tomorrow, then sched visit with surgeon for as soon as she returns from [place] - will be there Sat 8/31 - 9/12. Start Augmentin tonight to cover for recurrent diverticulitis. Orders Placed This Encounter  Procedures  . Urine Culture  . CT Abdomen Pelvis W Contrast    Standing Status:   Future    Standing Expiration Date:   09/28/2019  Order Specific Question:   If  indicated for the ordered procedure, I authorize the administration of contrast media per Radiology protocol    Answer:   Yes    Order Specific Question:   Preferred imaging location?    Answer:   [facility]    Order Specific Question:   Is Oral Contrast requested for this exam?    Answer:   Yes, Per Radiology protocol    Order Specific Question:   Radiology Contrast Protocol - do NOT remove file path    Answer:   [email or web address]  . Basic metabolic panel    Order Specific Question:   Has the patient fasted?    Answer:   No  . Ambulatory referral to General Surgery    Referral Priority:   Urgent    Referral Type:   Surgical    Referral Reason:   Specialty Services Required    Referred to Provider:   Ileana Roup, MD    Requested Specialty:   General Surgery    Number of Visits Requested:   1  . POCT urinalysis dipstick  . POCT CBC    Meds ordered this encounter  Medications  . amoxicillin-clavulanate (AUGMENTIN) 875-125 MG tablet    Sig: Take 1 tablet by mouth 2 (two) times daily.    Dispense:  28 tablet    Refill:  0     Delman Cheadle, M.D.  Primary Care at [facility] [address] [place], [place] [address] [phone] phone [phone] fax  06/27/18 4:38 PM

## 2018-06-28 ENCOUNTER — Inpatient Hospital Stay
Admission: RE | Admit: 2018-06-28 | Discharge: 2018-06-28 | Disposition: A | Discharge status: Home / Self Care | Payer: Self-pay | Source: Ambulatory Visit | Attending: Family Medicine | Admitting: Family Medicine

## 2018-06-28 ENCOUNTER — Other Ambulatory Visit: Payer: Self-pay | Admitting: Family Medicine

## 2018-06-28 ENCOUNTER — Ambulatory Visit (HOSPITAL_COMMUNITY)
Admission: RE | Admit: 2018-06-28 | Discharge: 2018-06-28 | Disposition: A | Discharge status: Home / Self Care | Payer: Medicare Other | Source: Ambulatory Visit | Attending: Family Medicine | Admitting: Family Medicine

## 2018-06-28 DIAGNOSIS — C801 Malignant (primary) neoplasm, unspecified: Secondary | ICD-10-CM

## 2018-06-28 DIAGNOSIS — K449 Diaphragmatic hernia without obstruction or gangrene: Secondary | ICD-10-CM

## 2018-06-28 DIAGNOSIS — N200 Calculus of kidney: Secondary | ICD-10-CM | POA: Diagnosis not present

## 2018-06-28 DIAGNOSIS — R1032 Left lower quadrant pain: Secondary | ICD-10-CM

## 2018-06-28 DIAGNOSIS — K760 Fatty (change of) liver, not elsewhere classified: Secondary | ICD-10-CM

## 2018-06-28 HISTORY — DX: Fatty (change of) liver, not elsewhere classified: K76.0

## 2018-06-28 HISTORY — DX: Diaphragmatic hernia without obstruction or gangrene: K44.9

## 2018-06-28 LAB — URINE CULTURE

## 2018-06-28 MED ORDER — IOHEXOL 300 MG/ML  SOLN
100.0000 mL | Freq: Once | INTRAMUSCULAR | Status: AC | PRN
  Administered 2018-06-28: 100 mL via INTRAVENOUS

## 2018-06-29 ENCOUNTER — Encounter: Payer: Self-pay | Admitting: Family Medicine

## 2018-06-29 NOTE — Addendum Note (Signed)
Addended by: Shawnee Knapp on: 06/29/2018 06:03 PM   Modules accepted: Orders

## 2018-07-13 ENCOUNTER — Encounter (HOSPITAL_COMMUNITY): Payer: Self-pay

## 2018-07-13 ENCOUNTER — Encounter: Payer: Self-pay | Admitting: Family Medicine

## 2018-07-13 ENCOUNTER — Ambulatory Visit (INDEPENDENT_AMBULATORY_CARE_PROVIDER_SITE_OTHER): Payer: Medicare Other | Admitting: Family Medicine

## 2018-07-13 ENCOUNTER — Other Ambulatory Visit: Payer: Self-pay

## 2018-07-13 ENCOUNTER — Ambulatory Visit (HOSPITAL_COMMUNITY)
Admission: RE | Admit: 2018-07-13 | Discharge: 2018-07-13 | Disposition: A | Discharge status: Home / Self Care | Payer: Medicare Other | Source: Ambulatory Visit | Attending: Family Medicine | Admitting: Family Medicine

## 2018-07-13 ENCOUNTER — Emergency Department (HOSPITAL_COMMUNITY)
Admission: EM | Admit: 2018-07-13 | Discharge: 2018-07-13 | Disposition: A | Discharge status: Home / Self Care | Payer: Medicare Other | Attending: Emergency Medicine | Admitting: Emergency Medicine

## 2018-07-13 ENCOUNTER — Telehealth: Payer: Self-pay | Admitting: Internal Medicine

## 2018-07-13 VITALS — BP 133/83 | HR 93 | Temp 98.4°F | Ht 62.0 in | Wt 152.0 lb

## 2018-07-13 DIAGNOSIS — C786 Secondary malignant neoplasm of retroperitoneum and peritoneum: Secondary | ICD-10-CM

## 2018-07-13 DIAGNOSIS — C482 Malignant neoplasm of peritoneum, unspecified: Secondary | ICD-10-CM | POA: Diagnosis not present

## 2018-07-13 DIAGNOSIS — R1030 Lower abdominal pain, unspecified: Secondary | ICD-10-CM

## 2018-07-13 DIAGNOSIS — K567 Ileus, unspecified: Secondary | ICD-10-CM | POA: Insufficient documentation

## 2018-07-13 DIAGNOSIS — N200 Calculus of kidney: Secondary | ICD-10-CM

## 2018-07-13 DIAGNOSIS — I10 Essential (primary) hypertension: Secondary | ICD-10-CM | POA: Insufficient documentation

## 2018-07-13 DIAGNOSIS — Z8719 Personal history of other diseases of the digestive system: Secondary | ICD-10-CM | POA: Diagnosis not present

## 2018-07-13 DIAGNOSIS — Z79899 Other long term (current) drug therapy: Secondary | ICD-10-CM | POA: Diagnosis not present

## 2018-07-13 DIAGNOSIS — Z91040 Latex allergy status: Secondary | ICD-10-CM | POA: Insufficient documentation

## 2018-07-13 DIAGNOSIS — C5700 Malignant neoplasm of unspecified fallopian tube: Secondary | ICD-10-CM | POA: Diagnosis not present

## 2018-07-13 LAB — COMPREHENSIVE METABOLIC PANEL
ALT: 23 U/L (ref 0–44)
AST: 23 U/L (ref 15–41)
Albumin: 3.7 g/dL (ref 3.5–5.0)
Alkaline Phosphatase: 59 U/L (ref 38–126)
Anion gap: 11 (ref 5–15)
BUN: 14 mg/dL (ref 8–23)
CHLORIDE: 101 mmol/L (ref 98–111)
CO2: 24 mmol/L (ref 22–32)
CREATININE: 0.66 mg/dL (ref 0.44–1.00)
Calcium: 9.2 mg/dL (ref 8.9–10.3)
GFR calc Af Amer: >60 mL/min (ref >60)
GFR calc non Af Amer: >60 mL/min (ref >60)
Glucose, Bld: 125 mg/dL — ABNORMAL HIGH (ref 70–99)
POTASSIUM: 3.7 mmol/L (ref 3.5–5.1)
SODIUM: 136 mmol/L (ref 135–145)
Total Bilirubin: 0.5 mg/dL (ref 0.3–1.2)
Total Protein: 7.6 g/dL (ref 6.5–8.1)

## 2018-07-13 LAB — POCT URINALYSIS DIP (MANUAL ENTRY)
BILIRUBIN UA: NEGATIVE
BILIRUBIN UA: NEGATIVE mg/dL
Glucose, UA: NEGATIVE mg/dL
Leukocytes, UA: NEGATIVE
Nitrite, UA: NEGATIVE
PH UA: 5.5 (ref 5.0–8.0)
Protein Ur, POC: NEGATIVE mg/dL
RBC UA: NEGATIVE
SPEC GRAV UA: 1.02 (ref 1.010–1.025)
Urobilinogen, UA: 0.2 E.U./dL

## 2018-07-13 LAB — POCT CBC
GRANULOCYTE PERCENT: 75.2 % (ref 37–80)
HEMATOCRIT: 36.1 % — AB (ref 37.7–47.9)
Hemoglobin: 12.2 g/dL (ref 12.2–16.2)
LYMPH, POC: 1.7 (ref 0.6–3.4)
MCH, POC: 31.8 pg — AB (ref 27–31.2)
MCHC: 33.8 g/dL (ref 31.8–35.4)
MCV: 94.1 fL (ref 80–97)
MID (cbc): 0.7 (ref 0–0.9)
MPV: 7 fL (ref 0–99.8)
POC GRANULOCYTE: 7.3 — AB (ref 2–6.9)
POC LYMPH PERCENT: 18 %L (ref 10–50)
POC MID %: 6.8 % (ref 0–12)
Platelet Count, POC: 380 10*3/uL (ref 142–424)
RBC: 3.84 M/uL — AB (ref 4.04–5.48)
RDW, POC: 14.1 %
WBC: 9.7 10*3/uL (ref 4.6–10.2)

## 2018-07-13 LAB — CBC
HEMATOCRIT: 37.3 % (ref 36.0–46.0)
Hemoglobin: 12.6 g/dL (ref 12.0–15.0)
MCH: 32.8 pg (ref 26.0–34.0)
MCHC: 33.8 g/dL (ref 30.0–36.0)
MCV: 97.1 fL (ref 78.0–100.0)
PLATELETS: 367 10*3/uL (ref 150–400)
RBC: 3.84 MIL/uL — AB (ref 3.87–5.11)
RDW: 13.4 % (ref 11.5–15.5)
WBC: 10.1 10*3/uL (ref 4.0–10.5)

## 2018-07-13 LAB — POC MICROSCOPIC URINALYSIS (UMFC): Mucus: ABSENT

## 2018-07-13 LAB — LIPASE, BLOOD: LIPASE: 27 U/L (ref 11–51)

## 2018-07-13 MED ORDER — OXYCODONE-ACETAMINOPHEN 5-325 MG PO TABS: 1.0000 | ORAL_TABLET | ORAL | 0 refills | Status: DC | PRN

## 2018-07-13 MED ORDER — DOCUSATE SODIUM 100 MG PO CAPS: 100.0000 mg | ORAL_CAPSULE | Freq: Two times a day (BID) | ORAL | 0 refills | Status: DC | PRN

## 2018-07-13 MED ORDER — IOHEXOL 300 MG/ML  SOLN
100.0000 mL | Freq: Once | INTRAMUSCULAR | Status: AC | PRN
  Administered 2018-07-13: 100 mL via ORAL

## 2018-07-13 MED ORDER — IOPAMIDOL (ISOVUE-300) INJECTION 61%
INTRAVENOUS | Status: AC
  Filled 2018-07-13: qty 30

## 2018-07-13 NOTE — Telephone Encounter (Signed)
Hi Dr. Henrene Pastor, Dr. Carlota Raspberry called regarding this pt, who has an appt with Dr. Tarri Glenn on 07/23/18. Dr. Carlota Raspberry wants to speak with one of our GI specialist before pt is seen. Pls call him. Thank you

## 2018-07-13 NOTE — Discharge Instructions (Addendum)
Please follow up with your oncology team at [facility]

## 2018-07-13 NOTE — Patient Instructions (Addendum)
   Blood counts and urine test overall looked okay in the office.  I will check the CAT scan today and next step will be determined by those results and possibly discussion with gastroenterology.   If you have lab work done today you will be contacted with your lab results within the next 2 weeks.  If you have not heard from [place] then please contact us. The fastest way to get your results is to register for My Chart.   IF you received an x-ray today, you will receive an invoice from [facility]. Please contact [facility] Radiology at [phone] with questions or concerns regarding your invoice.   IF you received labwork today, you will receive an invoice from [place]. Please contact [facility] at [phone] with questions or concerns regarding your invoice.   Our billing staff will not be able to assist you with questions regarding bills from these companies.  You will be contacted with the lab results as soon as they are available. The fastest way to get your results is to activate your My Chart account. Instructions are located on the last page of this paperwork. If you have not heard from [place] regarding the results in 2 weeks, please contact this office.

## 2018-07-13 NOTE — ED Notes (Signed)
This RN walked into room to discharge pt and pt was gone and daughter was there with her discharge papers. She stated that pt had signed for discharge and they had all left and campos MD called them to come back for the radiology disc. So the daughter came back in to wait on it. Once the room was empty this RN looked at the pt chart and realized that the pt had not signed and V/S were not done. Unknown how daughter obtained discharge papers without signing or V/S being taken. This is why it is not documented.

## 2018-07-13 NOTE — Telephone Encounter (Signed)
I spoke with Dr. Carlota Raspberry and reviewed the patient's CT scan. Unfortunately, she has peritoneal carcinomatosis which may explain her progressive pain. He is planning on having her admitted to the hospital for further evaluation and treatment.

## 2018-07-13 NOTE — Progress Notes (Addendum)
Subjective:  By signing my name below, I, Lisa Moyer, attest that this documentation has been prepared under the direction and in the presence of Lisa Ray, MD. Electronically Signed: Moises Moyer, [place]. 07/13/2018 , 11:45 AM .  Patient was seen in Room 3 .   Patient ID: Lisa Moyer, female    DOB: Dec 08, 1934, 83 y.o.   MRN: [id number] Chief Complaint  Patient presents with  . Diverticulitis    not sure if she needs more antibiotics or just need surgery. Pt needs chemo for ovarian cancer however, the issue she is having has postponed the chemo. Went off antibiotics 2 day ago   HPI Lisa Moyer is a 83 y.o. female  She is a patient of Dr. Raul Del, most recently seen on Aug 28th for LLQ abdominal pain. She was started on Augmentin at that time for recurrent diverticulitis. She had CT abdomen and pelvis on Aug 29th, which indicated changes consistent with diverticulitis with free fluid in pelvis, and no focal contained abscess. Previously noted fluid deep in pelvis, resolved since prior imaging in July. She was treated with Augment 875 mg bid for 14 days. Plan for follow up with GI after she returns from traveling out of town. It appears she's been referred to [place] in July, with appointment scheduled for Sept 23rd with Dr. Wylie Hail.   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 She's followed by Dr. Benita Stabile and Lonzo Candy with [facility] in [place], [place], as well as Dr. Wynelle Cleveland with GYN onc at [facility]. It appears she had fallopian tube cancer primarily treated with chemotherapy, and surgery in Nov 2017. Chemo had been completed in Jan 2018. She apparently had rising CA 125 readings. PET scan obtained on Sept 11th, indicating radiographic evidence of recurring of cancer with small volume carcinomatosis and small volume ascites. She was not a candidate to initiate chemo right away with recurrent diverticulitis. Planned  for 6 week follow up with medical oncology for possible chemo initiation at that time. Previous diverticular abscess when seen in July through the ER, treated with Cipro and Flagyl for diverticulitis at that time.   Patient states having improvement of pain while on the antibiotics prescribed by Dr. Brigitte Pulse, not resolved but greatly improved. But pain had returned after stopping her antibiotics about 3 days ago. She can feel the pain wincing occasionally, notice swelling, and also having some nausea, as well as feeling very fatigued. She reports having more pain now versus when she was seen by Dr. Brigitte Pulse. She also mentions not having a bowel movement in the past 2 days; last episode of bowel movement was about 3 days ago and was diarrhea. She denies any fever, or vomiting. She notes moved to [facility] about 2 months ago, and diverticulitis started around that time. Her daughter notes the patient did have diverticulitis in the past in [place], [place] as well. She denies urinary frequency, hematuria or dysuria.   She has one of her 3 daughter with her today.   Patient Active Problem List   Diagnosis Date Noted  . Diverticulitis of colon 06/27/2018   Past Medical History:  Diagnosis Date  . Cancer ([place])   . Diverticulitis   . Fallopian tube cancer, carcinoma ([place])   . Fatty liver 06/28/2018   seen on CT  . Hyperlipemia   . Hypertension   . Kidney stone 05/2018   Right 61mm nonobstructing renal stone seen on CT - pt asymptomatic  .  Sliding hiatal hernia 06/28/2018   seen on CT   Past Surgical History:  Procedure Laterality Date  . ABDOMINAL HYSTERECTOMY    . CHOLECYSTECTOMY    . OTHER SURGICAL HISTORY  2017-2018   Chemotherapy    Allergies  Allergen Reactions  . Latex    Prior to Admission medications   Medication Sig Start Date End Date Taking? Authorizing Provider  amoxicillin-clavulanate (AUGMENTIN) 875-125 MG tablet Take 1 tablet by mouth 2 (two) times daily. 06/27/18   Shawnee Knapp, MD    Ascorbic Acid (VITAMIN C PO) Take 1 tablet by mouth daily.    [provider]  CALCIUM PO Take 1 tablet by mouth daily.    [provider]  Cholecalciferol (VITAMIN D PO) Take 1 tablet by mouth daily.    [provider]  ibuprofen (ADVIL,MOTRIN) 200 MG tablet Take 200 mg by mouth at bedtime as needed (sleep).    [provider]  losartan (COZAAR) 50 MG tablet Take 50 mg by mouth daily.    [provider]  rosuvastatin (CRESTOR) 10 MG tablet Take 10 mg by mouth daily.    [provider]   Social History   Socioeconomic History  . Marital status: Widowed    Spouse name: Not on file  . Number of children: Not on file  . Years of education: Not on file  . Highest education level: Not on file  Occupational History  . Not on file  Social Needs  . Financial resource strain: Not on file  . Food insecurity:    Worry: Not on file    Inability: Not on file  . Transportation needs:    Medical: Not on file    Non-medical: Not on file  Tobacco Use  . Smoking status: Never Smoker  . Smokeless tobacco: Never Used  Substance and Sexual Activity  . Alcohol use: Never    Frequency: Never  . Drug use: Never  . Sexual activity: Not on file  Lifestyle  . Physical activity:    Days per week: Not on file    Minutes per session: Not on file  . Stress: Not on file  Relationships  . Social connections:    Talks on phone: Not on file    Gets together: Not on file    Attends religious service: Not on file    Active member of club or organization: Not on file    Attends meetings of clubs or organizations: Not on file    Relationship status: Not on file  . Intimate partner violence:    Fear of current or ex partner: Not on file    Emotionally abused: Not on file    Physically abused: Not on file    Forced sexual activity: Not on file  Other Topics Concern  . Not on file  Social History Narrative  . Not on file   Review of Systems   Constitutional: Negative for chills, fatigue, fever and unexpected weight change.  Respiratory: Negative for cough.   Gastrointestinal: Positive for abdominal distention, abdominal pain, diarrhea (2 days ago) and nausea. Negative for Moyer in stool and vomiting.  Genitourinary: Negative for dysuria, frequency and hematuria.  Skin: Negative for rash and wound.  Neurological: Negative for dizziness, weakness and headaches.       Objective:   Physical Exam  Constitutional: She is oriented to person, place, and time. She appears well-developed and well-nourished. No distress.  HENT:  Head: Normocephalic and atraumatic.  Eyes: Pupils  are equal, round, and reactive to light. EOM are normal.  Neck: Neck supple.  Cardiovascular: Normal rate.  Pulmonary/Chest: Effort normal. No respiratory distress.  Abdominal: She exhibits no distension. Bowel sounds are decreased. There is tenderness in the right lower quadrant, suprapubic area and left lower quadrant. There is guarding (slight). There is no rebound.  Hypoactive bowel sounds, does not appear overly distended  Musculoskeletal: Normal range of motion.  Neurological: She is alert and oriented to person, place, and time.  Skin: Skin is warm and dry.  Psychiatric: She has a normal mood and affect. Her behavior is normal.  Nursing note and vitals reviewed.   Vitals:   07/13/18 1116  BP: 133/83  Pulse: 93  Temp: 98.4 F (36.9 C)  TempSrc: Oral  SpO2: 98%  Weight: 152 lb (68.9 kg)  Height: 5\' 2"  (1.575 m)   Results for orders placed or performed in visit on 07/13/18  POCT CBC  Result Value Ref Range   WBC 9.7 4.6 - 10.2 K/uL   Lymph, poc 1.7 0.6 - 3.4   POC LYMPH PERCENT 18.0 10 - 50 %L   MID (cbc) 0.7 0 - 0.9   POC MID % 6.8 0 - 12 %M   POC Granulocyte 7.3 (A) 2 - 6.9   Granulocyte percent 75.2 37 - 80 %G   RBC 3.84 (A) 4.04 - 5.48 M/uL   Hemoglobin 12.2 12.2 - 16.2 g/dL   HCT, POC 36.1 (A) 37.7 - 47.9 %   MCV 94.1 80 - 97 fL    MCH, POC 31.8 (A) 27 - 31.2 pg   MCHC 33.8 31.8 - 35.4 g/dL   RDW, POC 14.1 %   Platelet Count, POC 380 142 - 424 K/uL   MPV 7.0 0 - 99.8 fL  POCT urinalysis dipstick  Result Value Ref Range   Color, UA yellow yellow   Clarity, UA clear clear   Glucose, UA negative negative mg/dL   Bilirubin, UA negative negative   Ketones, POC UA negative negative mg/dL   Spec Grav, UA 1.020 1.010 - 1.025   Moyer, UA negative negative   pH, UA 5.5 5.0 - 8.0   Protein Ur, POC negative negative mg/dL   Urobilinogen, UA 0.2 0.2 or 1.0 E.U./dL   Nitrite, UA Negative Negative   Leukocytes, UA Negative Negative  POCT Microscopic Urinalysis ([facility])  Result Value Ref Range   WBC,UR,HPF,POC None None WBC/hpf   RBC,UR,HPF,POC None None RBC/hpf   Bacteria None None, Too numerous to count   Mucus Absent Absent   Epithelial Cells, UR Per Microscopy None None, Too numerous to count cells/hpf    Ct Abdomen Pelvis W Contrast  Result Date: 07/13/2018 CLINICAL DATA:  Lower abdominal pain and ovarian cancer. EXAM: CT ABDOMEN AND PELVIS WITH CONTRAST TECHNIQUE: Multidetector CT imaging of the abdomen and pelvis was performed using the standard protocol following bolus administration of intravenous contrast. CONTRAST:  163mL OMNIPAQUE IOHEXOL 300 MG/ML  SOLN COMPARISON:  06/28/2018 FINDINGS: Lower chest: No acute abnormality. Hepatobiliary: Post cholecystectomy.  Unremarkable liver. Pancreas: Unremarkable Spleen: Unremarkable Adrenals/Urinary Tract: Multiple small calculi in the collecting system of the right kidney. Left kidney is within normal limits. Adrenal glands are within normal limits. Bladder is somewhat distended. Stomach/Bowel: There is wall thickening of the sigmoid colon and stranding in the adjacent fat. There is diverticulosis of the sigmoid colon. There are scattered air-fluid levels throughout the colon. There also mildly prominent air and fluid-filled loops of small bowel scattered across  the abdomen.  There is no transition point. Vascular/Lymphatic: Atherosclerotic calcifications of the aorta and iliac arteries. No abnormal retroperitoneal adenopathy. Reproductive: Uterus is absent.  No adnexal mass. Other: Free fluid and omental caking are compatible with peritoneal carcinomatosis. Stranding in the omental fat is present. There is also stranding within the fat inferior to the right lobe of the liver as well as anterior to the descending colon. Stranding in the fat within the pelvis adjacent to the sigmoid colon is also likely related to peritoneal carcinomatosis, although an inflammatory process of the sigmoid colon is not excluded. There is no evidence of free intraperitoneal gas to suggest perforation. There is scattered free fluid within the mesentery. Musculoskeletal: No vertebral compression deformity. IMPRESSION: Findings are consistent with peritoneal carcinomatosis given the history of ovarian carcinoma. There is omental caking as well as stranding within various areas of the peritoneal fat. There is also wall thickening of the sigmoid colon with adjacent stranding. This finding can also be related to peritoneal carcinomatosis and implants although an inflammatory process of the sigmoid colon is not excluded. Right nephrolithiasis. Ileus pattern. Electronically Signed   By: Marybelle Killings M.D.   On: 07/13/2018 15:29    Over 40 minutes total care with greater than 50%counseling.     Assessment & Plan:   ILEE RANDLEMAN is a 83 y.o. female History of diverticulitis - Plan: POCT CBC  Lower abdominal pain - Plan: POCT CBC, POCT urinalysis dipstick, POCT Microscopic Urinalysis ([facility]), CT Abdomen Pelvis W Contrast, CANCELED: CT Abdomen Pelvis W Contrast  Carcinoma of fallopian tube, unspecified laterality ([place])  History of fallopian tube carcinoma as above as well as recent diverticulitis.  Had just completed 14-day treatment of diverticulitis, improving on treatment but then within a few days  of stopping abx had increasing pain, and current pain worse than previous diverticular pain.  Recent PET scan showed recurrence of carcinoma.    -CT scan obtained today indicating peritoneal carcinomatosis also with sigmoid colon thickening and adjacent stranding.  Possible combination of peritoneal carcinomatosis along with diverticulitis.  Given these findings and increasing pain discussed with gastroenterology on-call, also agrees for evaluation in ER for likely hospitalization.  Discussed with family and sent to [facility], ER for evaluation and likely admission.   No orders of the defined types were placed in this encounter.  Patient Instructions     Moyer counts and urine test overall looked okay in the office.  I will check the CAT scan today and next step will be determined by those results and possibly discussion with gastroenterology.   If you have lab work done today you will be contacted with your lab results within the next 2 weeks.  If you have not heard from [place] then please contact us. The fastest way to get your results is to register for My Chart.   IF you received an x-Moyer today, you will receive an invoice from [facility]. Please contact [facility] at [phone] with questions or concerns regarding your invoice.   IF you received labwork today, you will receive an invoice from [facility]. Please contact [facility] at [phone] with questions or concerns regarding your invoice.   Our billing staff will not be able to assist you with questions regarding bills from these companies.  You will be contacted with the lab results as soon as they are available. The fastest way to get your results is to activate your My Chart account. Instructions are located on the last page of  this paperwork. If you have not heard from [place] regarding the results in 2 weeks, please contact this office.      I personally performed the services described in this documentation,  which was scribed in my presence. The recorded information has been reviewed and considered for accuracy and completeness, addended by me as needed, and agree with information above.  Signed,   Lisa Ray, MD [facility].  07/13/18 4:42 PM

## 2018-07-13 NOTE — ED Triage Notes (Signed)
Patient went to PCP today and was told to come to the ED to be admitted. CT scan today showed worsening diverticulitis and fluids in the abdomen.

## 2018-07-14 NOTE — ED Provider Notes (Signed)
[facility] DEPT Provider Note   CSN: [id number] Arrival date & time: 07/13/18  1701     History   Chief Complaint Chief Complaint  Patient presents with  . Diverticulitis  . Abdominal Pain    HPI Lisa Moyer is a 83 y.o. female.  HPI Patient is an 83 year old female with a history of ovarian cancer who is primarily receiving her cancer care at [facility].  She recently relocated to [place].  She presents the emergency department with worsening lower abdominal pain over the past 2 weeks.  She recently underwent PET CT imaging 3 days ago in [place] which demonstrated recurrence and peritoneal disease of her abdomen without evidence of active diverticulitis.  She developed some worsening lower abdominal pain today and outpatient CT scan was performed which the patient and patient's family reportedly were told represented diverticulitis and that she would need admission the hospital for IV antibiotics.  Patient reports mild lower diffuse abdominal discomfort both in the left and right lower quadrant.  No fevers at home.  Denies nausea vomiting.  Her pain waxes and wanes and is sharp and cramping in nature.  No urinary complaints.   Past Medical History:  Diagnosis Date  . Cancer ([place])   . Diverticulitis   . Fallopian tube cancer, carcinoma ([place])   . Fatty liver 06/28/2018   seen on CT  . Hyperlipemia   . Hypertension   . Kidney stone 05/2018   Right 69mm nonobstructing renal stone seen on CT - pt asymptomatic  . Sliding hiatal hernia 06/28/2018   seen on CT    Patient Active Problem List   Diagnosis Date Noted  . Diverticulitis of colon 06/27/2018    Past Surgical History:  Procedure Laterality Date  . ABDOMINAL HYSTERECTOMY    . CHOLECYSTECTOMY    . OTHER SURGICAL HISTORY  2017-2018   Chemotherapy      OB History   None      Home Medications    Prior to Admission medications   Medication Sig Start Date End Date  Taking? Authorizing Provider  Ascorbic Acid (VITAMIN C PO) Take 1 tablet by mouth daily.   Yes [provider]  CALCIUM PO Take 1 tablet by mouth daily.   Yes [provider]  Cholecalciferol (VITAMIN D PO) Take 1 tablet by mouth daily.   Yes [provider]  diphenhydramine-acetaminophen (TYLENOL PM) 25-500 MG TABS tablet Take 1 tablet by mouth at bedtime as needed (sleep).   Yes [provider]  ibuprofen (ADVIL,MOTRIN) 200 MG tablet Take 200 mg by mouth at bedtime as needed (sleep).   Yes [provider]  losartan (COZAAR) 50 MG tablet Take 50 mg by mouth daily.   Yes [provider]  rosuvastatin (CRESTOR) 10 MG tablet Take 10 mg by mouth daily.   Yes [provider]  docusate sodium (COLACE) 100 MG capsule Take 1 capsule (100 mg total) by mouth 2 (two) times daily as needed for mild constipation. 07/13/18   Jola Schmidt, MD  oxyCODONE-acetaminophen (PERCOCET/ROXICET) 5-325 MG tablet Take 1 tablet by mouth every 4 (four) hours as needed for severe pain. 07/13/18   Jola Schmidt, MD    Family History Family History  Problem Relation Age of Onset  . Cancer Mother   . Cancer Father     Social History Social History   Tobacco Use  . Smoking status: Never Smoker  . Smokeless tobacco: Never Used  Substance Use Topics  .  Alcohol use: Never    Frequency: Never  . Drug use: Never     Allergies   Latex   Review of Systems Review of Systems  All other systems reviewed and are negative.    Physical Exam Updated Vital Signs BP (!) 141/80   Pulse 87   Temp 98.4 F (36.9 C) (Oral)   Resp 17   SpO2 93%   Physical Exam  Constitutional: She is oriented to person, place, and time. She appears well-developed and well-nourished. No distress.  HENT:  Head: Normocephalic and atraumatic.  Eyes: EOM are normal.  Neck: Normal range of motion.  Cardiovascular: Normal rate and regular rhythm.  Pulmonary/Chest: Effort  normal and breath sounds normal.  Abdominal: Soft. She exhibits no distension.  Mild generalized lower abdominal tenderness without guarding or rebound.  No peritoneal signs.  Musculoskeletal: Normal range of motion.  Neurological: She is alert and oriented to person, place, and time.  Skin: Skin is warm and dry.  Psychiatric: She has a normal mood and affect. Judgment normal.  Nursing note and vitals reviewed.    ED Treatments / Results  Labs (all labs ordered are listed, but only abnormal results are displayed) Labs Reviewed  COMPREHENSIVE METABOLIC PANEL - Abnormal; Notable for the following components:      Result Value   Glucose, Bld 125 (*)    All other components within normal limits  CBC - Abnormal; Notable for the following components:   RBC 3.84 (*)    All other components within normal limits  LIPASE, BLOOD  URINALYSIS, ROUTINE W REFLEX MICROSCOPIC    EKG None  Radiology Ct Abdomen Pelvis W Contrast  Result Date: 07/13/2018 CLINICAL DATA:  Lower abdominal pain and ovarian cancer. EXAM: CT ABDOMEN AND PELVIS WITH CONTRAST TECHNIQUE: Multidetector CT imaging of the abdomen and pelvis was performed using the standard protocol following bolus administration of intravenous contrast. CONTRAST:  169mL OMNIPAQUE IOHEXOL 300 MG/ML  SOLN COMPARISON:  06/28/2018 FINDINGS: Lower chest: No acute abnormality. Hepatobiliary: Post cholecystectomy.  Unremarkable liver. Pancreas: Unremarkable Spleen: Unremarkable Adrenals/Urinary Tract: Multiple small calculi in the collecting system of the right kidney. Left kidney is within normal limits. Adrenal glands are within normal limits. Bladder is somewhat distended. Stomach/Bowel: There is wall thickening of the sigmoid colon and stranding in the adjacent fat. There is diverticulosis of the sigmoid colon. There are scattered air-fluid levels throughout the colon. There also mildly prominent air and fluid-filled loops of small bowel scattered  across the abdomen. There is no transition point. Vascular/Lymphatic: Atherosclerotic calcifications of the aorta and iliac arteries. No abnormal retroperitoneal adenopathy. Reproductive: Uterus is absent.  No adnexal mass. Other: Free fluid and omental caking are compatible with peritoneal carcinomatosis. Stranding in the omental fat is present. There is also stranding within the fat inferior to the right lobe of the liver as well as anterior to the descending colon. Stranding in the fat within the pelvis adjacent to the sigmoid colon is also likely related to peritoneal carcinomatosis, although an inflammatory process of the sigmoid colon is not excluded. There is no evidence of free intraperitoneal gas to suggest perforation. There is scattered free fluid within the mesentery. Musculoskeletal: No vertebral compression deformity. IMPRESSION: Findings are consistent with peritoneal carcinomatosis given the history of ovarian carcinoma. There is omental caking as well as stranding within various areas of the peritoneal fat. There is also wall thickening of the sigmoid colon with adjacent stranding. This finding can also be related to peritoneal carcinomatosis and  implants although an inflammatory process of the sigmoid colon is not excluded. Right nephrolithiasis. Ileus pattern. Electronically Signed   By: Marybelle Killings M.D.   On: 07/13/2018 15:29    Procedures Procedures (including critical care time)  Medications Ordered in ED Medications - No data to display   Initial Impression / Assessment and Plan / ED Course  I have reviewed the triage vital signs and the nursing notes.  Pertinent labs & imaging results that were available during my care of the patient were reviewed by me and considered in my medical decision making (see chart for details).     I discussed the CT reports with the patient and the patient's 2 family members in the room as well as discussed with a another family member who is on  teleconference call from [place].  Case was discussed with Dr. Maryclare Bean radiology who read her CT images as well.    From review of the patient's CT scans May 09, 2018 May 14, 2018 June 28, 2018 PET scan July 11, 2018 CTabd/pelvis images from today  It appears as though this is demonstrated progression of peritoneal disease now with peritoneal caking and nodularity.  There is no report of active diverticulitis on the CT PET scan performed 2 days ago in [place].  I discussed the case with Dr. Barbie Banner who reviewed the CT images from 06/28/2018 which reported possible diverticulitis.  It is his impression at this time that although there was some inflammation near the sigmoid colon where there is diverticulosis that in retrospect especially in light of today's images that this is likely and was likely developing peritoneal disease at that time with inflammatory changes surrounding the sigmoid colon.  The patient is well-appearing.  She is afebrile.  Her white count is not elevated.  She has mild generalized lower abdominal discomfort.  I do not believe the patient has active diverticulitis.  I think that she has had progressive and worsening of her peritoneal disease.  I am concerned about the overall rapid changes of her peritoneal disease and this likely explains the cause of her lower abdominal pain.  I do not think the patient needs acute hospitalization at this time.  I do not believe the patient needs IV antibiotics.  I do not believe the patient has active acute diverticulitis.  I think placement in the hospital and unnecessary/inappropriate antibiotics could actually do more harm than benefit.  I had a very prolonged discussion with the patient the patient's family members regarding this.  We have all decided together to follow-up with the oncology team at [facility].  CT has been burned onto a disc for follow-up purposes with her oncology team.  I have encouraged the patient the  family members to return the emergency department for any new or worsening symptoms.  Patient and all family members are in agreement with this outpatient plan  Final Clinical Impressions(s) / ED Diagnoses   Final diagnoses:  Peritoneal carcinoma [facility])    ED Discharge Orders         Ordered    oxyCODONE-acetaminophen (PERCOCET/ROXICET) 5-325 MG tablet  Every 4 hours PRN     07/13/18 2142    docusate sodium (COLACE) 100 MG capsule  2 times daily PRN     07/13/18 2142           Jola Schmidt, MD 07/14/18 [phone]

## 2018-07-15 ENCOUNTER — Encounter: Payer: Self-pay | Admitting: Family Medicine

## 2018-07-16 ENCOUNTER — Telehealth: Payer: Self-pay | Admitting: Family Medicine

## 2018-07-16 NOTE — Telephone Encounter (Signed)
Called with plan earlier today. See phone message.

## 2018-07-16 NOTE — Telephone Encounter (Signed)
See email.  I spoke with patient's daughter regarding previous ER visit, and plan.  She has an appointment with her internist at 12:00 today in [place].  Dr. Weldon Inches. Fax [phone].   Main question is whether or not further delaying chemotherapy as needed for possible diverticulitis versus findings on CT reflecting cancer not true infection.  Will fax copy of most recent CT scans, ER note, my note from Friday and Dr. Raul Del previous note to help with that care in [place].  Advised to let me know if we can help further.

## 2018-07-23 ENCOUNTER — Ambulatory Visit: Payer: Medicare Other | Admitting: Gastroenterology

## 2018-08-01 ENCOUNTER — Encounter: Payer: Self-pay | Admitting: Family Medicine

## 2018-11-02 ENCOUNTER — Ambulatory Visit: Payer: Self-pay | Admitting: Nurse Practitioner

## 2018-11-02 ENCOUNTER — Encounter: Payer: Self-pay | Admitting: Nurse Practitioner

## 2018-11-02 VITALS — BP 138/80 | HR 83 | Temp 98.4°F | Resp 12 | Wt 149.0 lb

## 2018-11-02 DIAGNOSIS — J018 Other acute sinusitis: Secondary | ICD-10-CM

## 2018-11-02 MED ORDER — LEVOFLOXACIN 500 MG PO TABS: 500.0000 mg | ORAL_TABLET | Freq: Every day | ORAL | 0 refills | Status: AC

## 2018-11-02 MED ORDER — IPRATROPIUM BROMIDE 0.06 % NA SOLN: 2.0000 | Freq: Four times a day (QID) | NASAL | 12 refills | Status: DC

## 2018-11-02 NOTE — Progress Notes (Signed)
Subjective:  Lisa Moyer is a 84 y.o. female who presents for evaluation of possible sinusitis.  Symptoms include nasal congestion and burning in her nostrils and sinuses.  Onset of symptoms was 3 weeks ago, and has been gradually improving since that time.  Patient did inform that she does feel better today versus how she felt last night.  The patient has been using nasal spray and saline flushes to help with nasal congestion at home. Treatment to date:  antibiotics, decongestants, nasal steroids and saline nasal spray.  High risk factors for influenza complications:  age greater than 61 years of age, immunocompromised and co-morbid illness.    The following portions of the patient's history were reviewed and updated as appropriate:  allergies, current medications and past medical history.  Constitutional: positive for fatigue, negative for anorexia, chills, fevers and malaise Eyes: negative Ears, nose, mouth, throat, and face: positive for nasal congestion and "burning feeling in nostrils and sinuses, negative for ear drainage, earaches, hoarseness and sore throat Respiratory: negative Cardiovascular: negative Gastrointestinal: negative Neurological: negative Allergic/Immunologic: negative Objective:  BP 138/80   Pulse 83   Temp 98.4 F (36.9 C)   Resp 12   Wt 149 lb (67.6 kg)   SpO2 94%   BMI 27.25 kg/m  General appearance: alert, cooperative and no distress Head: Normocephalic, without obvious abnormality, atraumatic Eyes: conjunctivae/corneas clear. PERRL, EOM's intact. Fundi benign. Ears: normal TM's and external ear canals both ears Nose: no discharge, moderate congestion, turbinates crusted, bleeding point in left nasal cavity, mild maxillary sinus tenderness bilateral, no frontal sinus tenderness bilateral Throat: lips, mucosa, and tongue normal; teeth and gums normal Lungs: clear to auscultation bilaterally Heart: regular rate and rhythm, S1, S2 normal, no murmur,  click, rub or gallop Abdomen: soft, non-tender; bowel sounds normal; no masses,  no organomegaly Pulses: 2+ and symmetric Skin: Skin color, texture, turgor normal. No rashes or lesions Lymph nodes: cervical and submandibular nodes normal Neurologic: Grossly normal    Assessment:  Acute Sinusitis    Plan:   Exam findings, diagnosis etiology and medication use and indications reviewed with patient. Follow- Up and discharge instructions provided. No emergent/urgent issues found on exam.  Discussed with patient the findings of the physical assessment.  Did not feel the patient has a continuous sinusitis at this time.  Based on the patient's symptoms, and physical assessment, did not feel this was a continued bacterial infection.  However, based on the patient's past medical history, and current state of being immunocompromise, this provider did go ahead and write a prescription for Levaquin if needed.  Patient was instructed to use mupirocin ointment topically which she is already doing at this time.  Informed patient's daughter to follow-up with the patient's primary care doctor or oncologist if symptoms do not improve and if she continues to describe the same or similar symptoms as patient may need imaging or further work-up.  Patient education was provided. Patient verbalized understanding of information provided and agrees with plan of care (POC), all questions answered. The patient is advised to call or return to clinic if condition does not see an improvement in symptoms, or to seek the care of the closest emergency department if condition worsens with the above plan.   1. Other acute sinusitis, recurrence not specified  - ipratropium (ATROVENT) 0.06 % nasal spray; Place 2 sprays into both nostrils 4 (four) times daily for 10 days.  Dispense: 15 mL; Refill: 12 - levofloxacin (LEVAQUIN) 500 MG tablet; Take 1  tablet (500 mg total) by mouth daily for 5 days.  Dispense: 5 tablet; Refill: 0 -Take  medication as prescribed. -Tylenol for pain, fever, or general discomfort. -Increase fluids. -Apply Mupirocin nasally twice daily to the nares. -May use normal saline nasal spray to help with nasal congestion throughout the day. -Follow-up with PCP if symptoms do not improve within the next 5-7 days.  2. Abrasion  -Continue use of mupirocin ointment nasally twice daily until symptoms improve.

## 2018-11-02 NOTE — Patient Instructions (Signed)
Sinusitis, Adult -Take medication as prescribed. -Tylenol for pain, fever, or general discomfort. -Increase fluids. -Apply Mupirocin nasally twice daily to the nares. -May use normal saline nasal spray to help with nasal congestion throughout the day. -Follow-up with PCP if symptoms do not improve within the next 5-7 days.   Sinusitis is inflammation of your sinuses. Sinuses are hollow spaces in the bones around your face. Your sinuses are located:  Around your eyes.  In the middle of your forehead.  Behind your nose.  In your cheekbones. Mucus normally drains out of your sinuses. When your nasal tissues become inflamed or swollen, mucus can become trapped or blocked. This allows bacteria, viruses, and fungi to grow, which leads to infection. Most infections of the sinuses are caused by a virus. Sinusitis can develop quickly. It can last for up to 4 weeks (acute) or for more than 12 weeks (chronic). Sinusitis often develops after a cold. What are the causes? This condition is caused by anything that creates swelling in the sinuses or stops mucus from draining. This includes:  Allergies.  Asthma.  Infection from bacteria or viruses.  Deformities or blockages in your nose or sinuses.  Abnormal growths in the nose (nasal polyps).  Pollutants, such as chemicals or irritants in the air.  Infection from fungi (rare). What increases the risk? You are more likely to develop this condition if you:  Have a weak body defense system (immune system).  Do a lot of swimming or diving.  Overuse nasal sprays.  Smoke. What are the signs or symptoms? The main symptoms of this condition are pain and a feeling of pressure around the affected sinuses. Other symptoms include:  Stuffy nose or congestion.  Thick drainage from your nose.  Swelling and warmth over the affected sinuses.  Headache.  Upper toothache.  A cough that may get worse at night.  Extra mucus that collects in  the throat or the back of the nose (postnasal drip).  Decreased sense of smell and taste.  Fatigue.  A fever.  Sore throat.  Bad breath. How is this diagnosed? This condition is diagnosed based on:  Your symptoms.  Your medical history.  A physical exam.  Tests to find out if your condition is acute or chronic. This may include: ? Checking your nose for nasal polyps. ? Viewing your sinuses using a device that has a light (endoscope). ? Testing for allergies or bacteria. ? Imaging tests, such as an MRI or CT scan. In rare cases, a bone biopsy may be done to rule out more serious types of fungal sinus disease. How is this treated? Treatment for sinusitis depends on the cause and whether your condition is chronic or acute.  If caused by a virus, your symptoms should go away on their own within 10 days. You may be given medicines to relieve symptoms. They include: ? Medicines that shrink swollen nasal passages (topical intranasal decongestants). ? Medicines that treat allergies (antihistamines). ? A spray that eases inflammation of the nostrils (topical intranasal corticosteroids). ? Rinses that help get rid of thick mucus in your nose (nasal saline washes).  If caused by bacteria, your health care provider may recommend waiting to see if your symptoms improve. Most bacterial infections will get better without antibiotic medicine. You may be given antibiotics if you have: ? A severe infection. ? A weak immune system.  If caused by narrow nasal passages or nasal polyps, you may need to have surgery. Follow these instructions at home:  Medicines  Take, use, or apply over-the-counter and prescription medicines only as told by your health care provider. These may include nasal sprays.  If you were prescribed an antibiotic medicine, take it as told by your health care provider. Do not stop taking the antibiotic even if you start to feel better. Hydrate and humidify   Drink  enough fluid to keep your urine pale yellow. Staying hydrated will help to thin your mucus.  Use a cool mist humidifier to keep the humidity level in your home above 50%.  Inhale steam for 10-15 minutes, 3-4 times a day, or as told by your health care provider. You can do this in the bathroom while a hot shower is running.  Limit your exposure to cool or dry air. Rest  Rest as much as possible.  Sleep with your head raised (elevated).  Make sure you get enough sleep each night. General instructions   Apply a warm, moist washcloth to your face 3-4 times a day or as told by your health care provider. This will help with discomfort.  Wash your hands often with soap and water to reduce your exposure to germs. If soap and water are not available, use hand sanitizer.  Do not smoke. Avoid being around people who are smoking (secondhand smoke).  Keep all follow-up visits as told by your health care provider. This is important. Contact a health care provider if:  You have a fever.  Your symptoms get worse.  Your symptoms do not improve within 10 days. Get help right away if:  You have a severe headache.  You have persistent vomiting.  You have severe pain or swelling around your face or eyes.  You have vision problems.  You develop confusion.  Your neck is stiff.  You have trouble breathing. Summary  Sinusitis is soreness and inflammation of your sinuses. Sinuses are hollow spaces in the bones around your face.  This condition is caused by nasal tissues that become inflamed or swollen. The swelling traps or blocks the flow of mucus. This allows bacteria, viruses, and fungi to grow, which leads to infection.  If you were prescribed an antibiotic medicine, take it as told by your health care provider. Do not stop taking the antibiotic even if you start to feel better.  Keep all follow-up visits as told by your health care provider. This is important. This information is  not intended to replace advice given to you by your health care provider. Make sure you discuss any questions you have with your health care provider. Document Released: 10/17/2005 Document Revised: 03/19/2018 Document Reviewed: 03/19/2018 Elsevier Interactive Patient Education  2019 [employer].

## 2018-12-21 ENCOUNTER — Telehealth: Payer: Self-pay

## 2018-12-21 NOTE — Telephone Encounter (Signed)
She called and left a message. She is being referred from [place], [place] to Dr. Denman George and they have sent her records. She has recurrent ovarian cancer and her last treatment is scheduled for 2/24. She would like appt so she can continue her maintnence treatment.

## 2018-12-21 NOTE — Telephone Encounter (Signed)
1) Is she in [place] now? 2) Needs outside records so I can get things set up

## 2018-12-24 ENCOUNTER — Telehealth: Payer: Self-pay | Admitting: Oncology

## 2018-12-24 ENCOUNTER — Encounter: Payer: Self-pay | Admitting: Hematology and Oncology

## 2018-12-24 DIAGNOSIS — C5700 Malignant neoplasm of unspecified fallopian tube: Secondary | ICD-10-CM | POA: Insufficient documentation

## 2018-12-24 NOTE — Telephone Encounter (Signed)
Left a message for patient with appointment to see Dr. Alvy Bimler on 12/31/18 at 2 pm.  Requested a return call.

## 2018-12-24 NOTE — Telephone Encounter (Signed)
Called Dr. Estelle Grumbles office and left a message requesting a copy of chemo flowsheet, genetic results and last echo report.

## 2018-12-24 NOTE — Telephone Encounter (Addendum)
Patient's daughter Oneita Jolly called back and said the appointment time is good for 12/31/18.  She asked if there will be any other appointments that week because they are traveling from [place] and would need to arrange for more time.

## 2018-12-24 NOTE — Telephone Encounter (Addendum)
Called Lisa Moyer back and discussed that Lisa Moyer will need other appointments like an echocardiogram before she starts chemotherapy here that may take a few more days.  She verbalized agreement and asked if that can be scheduled now.    Also discussed that the patient has been staying with her in [place] but is moving back to [place].  She will be living by herself and does have a son nearby to help her.  They are going to look in to arranging for a home health nurse to check on her daily as well.

## 2018-12-24 NOTE — Telephone Encounter (Signed)
Faxed request for CD of CT scans from 2020 to [facility] at [phone].

## 2018-12-25 ENCOUNTER — Telehealth: Payer: Self-pay | Admitting: Oncology

## 2018-12-25 ENCOUNTER — Encounter: Payer: Self-pay | Admitting: Hematology and Oncology

## 2018-12-25 NOTE — Telephone Encounter (Signed)
[place] and spoke to [place].  Verified that last echo was done on 07/20/18.  Requested last office note with latest chemo cycle which she will fax today.  Also asked about genetic testing and she was unable to find any notes that it was done.

## 2018-12-26 ENCOUNTER — Inpatient Hospital Stay
Admission: RE | Admit: 2018-12-26 | Discharge: 2018-12-26 | Disposition: A | Discharge status: Home / Self Care | Payer: Self-pay | Source: Ambulatory Visit | Attending: Hematology and Oncology | Admitting: Hematology and Oncology

## 2018-12-26 ENCOUNTER — Other Ambulatory Visit (HOSPITAL_COMMUNITY): Payer: Self-pay | Admitting: Hematology and Oncology

## 2018-12-26 ENCOUNTER — Encounter: Payer: Self-pay | Admitting: Oncology

## 2018-12-26 ENCOUNTER — Other Ambulatory Visit: Payer: Self-pay

## 2018-12-26 ENCOUNTER — Inpatient Hospital Stay: Admission: RE | Admit: 2018-12-26 | Payer: Self-pay | Source: Ambulatory Visit

## 2018-12-26 DIAGNOSIS — C801 Malignant (primary) neoplasm, unspecified: Secondary | ICD-10-CM

## 2018-12-26 DIAGNOSIS — C5700 Malignant neoplasm of unspecified fallopian tube: Secondary | ICD-10-CM

## 2018-12-31 ENCOUNTER — Ambulatory Visit (HOSPITAL_COMMUNITY)
Admission: RE | Admit: 2018-12-31 | Discharge: 2018-12-31 | Disposition: A | Discharge status: Home / Self Care | Payer: Medicare Other | Source: Ambulatory Visit | Attending: Hematology and Oncology | Admitting: Hematology and Oncology

## 2018-12-31 ENCOUNTER — Telehealth: Payer: Self-pay

## 2018-12-31 ENCOUNTER — Encounter: Payer: Self-pay | Admitting: Hematology and Oncology

## 2018-12-31 ENCOUNTER — Inpatient Hospital Stay: Payer: Medicare Other

## 2018-12-31 ENCOUNTER — Telehealth: Payer: Self-pay | Admitting: Oncology

## 2018-12-31 ENCOUNTER — Inpatient Hospital Stay: Payer: Medicare Other | Attending: Hematology and Oncology | Admitting: Hematology and Oncology

## 2018-12-31 VITALS — BP 166/83 | HR 100 | Temp 97.9°F | Resp 18 | Ht 62.0 in | Wt 150.6 lb

## 2018-12-31 DIAGNOSIS — C5701 Malignant neoplasm of right fallopian tube: Secondary | ICD-10-CM | POA: Insufficient documentation

## 2018-12-31 DIAGNOSIS — D539 Nutritional anemia, unspecified: Secondary | ICD-10-CM | POA: Insufficient documentation

## 2018-12-31 DIAGNOSIS — R197 Diarrhea, unspecified: Secondary | ICD-10-CM | POA: Insufficient documentation

## 2018-12-31 DIAGNOSIS — R531 Weakness: Secondary | ICD-10-CM | POA: Diagnosis not present

## 2018-12-31 DIAGNOSIS — D6481 Anemia due to antineoplastic chemotherapy: Secondary | ICD-10-CM

## 2018-12-31 DIAGNOSIS — R0609 Other forms of dyspnea: Secondary | ICD-10-CM

## 2018-12-31 DIAGNOSIS — K5732 Diverticulitis of large intestine without perforation or abscess without bleeding: Secondary | ICD-10-CM

## 2018-12-31 DIAGNOSIS — R5383 Other fatigue: Secondary | ICD-10-CM

## 2018-12-31 DIAGNOSIS — R0600 Dyspnea, unspecified: Secondary | ICD-10-CM

## 2018-12-31 DIAGNOSIS — C5700 Malignant neoplasm of unspecified fallopian tube: Secondary | ICD-10-CM

## 2018-12-31 DIAGNOSIS — C786 Secondary malignant neoplasm of retroperitoneum and peritoneum: Secondary | ICD-10-CM | POA: Diagnosis not present

## 2018-12-31 DIAGNOSIS — T451X5A Adverse effect of antineoplastic and immunosuppressive drugs, initial encounter: Secondary | ICD-10-CM

## 2018-12-31 DIAGNOSIS — R809 Proteinuria, unspecified: Secondary | ICD-10-CM | POA: Insufficient documentation

## 2018-12-31 DIAGNOSIS — D61818 Other pancytopenia: Secondary | ICD-10-CM | POA: Diagnosis not present

## 2018-12-31 DIAGNOSIS — I10 Essential (primary) hypertension: Secondary | ICD-10-CM | POA: Diagnosis not present

## 2018-12-31 LAB — CBC WITH DIFFERENTIAL/PLATELET
Abs Immature Granulocytes: 0.01 10*3/uL (ref 0.00–0.07)
BASOS ABS: 0 10*3/uL (ref 0.0–0.1)
Basophils Relative: 0 %
Eosinophils Absolute: 0 10*3/uL (ref 0.0–0.5)
Eosinophils Relative: 1 %
HCT: 22.2 % — ABNORMAL LOW (ref 36.0–46.0)
Hemoglobin: 7.3 g/dL — ABNORMAL LOW (ref 12.0–15.0)
IMMATURE GRANULOCYTES: 0 %
Lymphocytes Relative: 41 %
Lymphs Abs: 1 10*3/uL (ref 0.7–4.0)
MCH: 35.6 pg — ABNORMAL HIGH (ref 26.0–34.0)
MCHC: 32.9 g/dL (ref 30.0–36.0)
MCV: 108.3 fL — ABNORMAL HIGH (ref 80.0–100.0)
Monocytes Absolute: 0.4 10*3/uL (ref 0.1–1.0)
Monocytes Relative: 14 %
Neutro Abs: 1.1 10*3/uL — ABNORMAL LOW (ref 1.7–7.7)
Neutrophils Relative %: 44 %
Platelets: 51 10*3/uL — ABNORMAL LOW (ref 150–400)
RBC: 2.05 MIL/uL — ABNORMAL LOW (ref 3.87–5.11)
RDW: 16.1 % — ABNORMAL HIGH (ref 11.5–15.5)
WBC: 2.5 10*3/uL — ABNORMAL LOW (ref 4.0–10.5)
nRBC: 0 % (ref 0.0–0.2)

## 2018-12-31 LAB — COMPREHENSIVE METABOLIC PANEL
ALT: 15 U/L (ref 0–44)
AST: 22 U/L (ref 15–41)
Albumin: 3.6 g/dL (ref 3.5–5.0)
Alkaline Phosphatase: 62 U/L (ref 38–126)
Anion gap: 10 (ref 5–15)
BUN: 23 mg/dL (ref 8–23)
CHLORIDE: 110 mmol/L (ref 98–111)
CO2: 21 mmol/L — ABNORMAL LOW (ref 22–32)
Calcium: 9 mg/dL (ref 8.9–10.3)
Creatinine, Ser: 0.99 mg/dL (ref 0.44–1.00)
GFR calc Af Amer: >60 mL/min (ref >60)
GFR calc non Af Amer: 53 mL/min — ABNORMAL LOW (ref >60)
Glucose, Bld: 105 mg/dL — ABNORMAL HIGH (ref 70–99)
Potassium: 4.3 mmol/L (ref 3.5–5.1)
Sodium: 141 mmol/L (ref 135–145)
Total Bilirubin: 0.4 mg/dL (ref 0.3–1.2)
Total Protein: 6.9 g/dL (ref 6.5–8.1)

## 2018-12-31 LAB — SAMPLE TO BLOOD BANK

## 2018-12-31 LAB — PREPARE RBC (CROSSMATCH)

## 2018-12-31 LAB — SEDIMENTATION RATE: Sed Rate: 101 mm/hr — ABNORMAL HIGH (ref 0–22)

## 2018-12-31 LAB — MAGNESIUM: Magnesium: 1.5 mg/dL — ABNORMAL LOW (ref 1.7–2.4)

## 2018-12-31 LAB — VITAMIN B12: Vitamin B-12: 2401 pg/mL — ABNORMAL HIGH (ref 180–914)

## 2018-12-31 NOTE — Progress Notes (Signed)
START OFF PATHWAY REGIMEN - Ovarian   [id number]:Bevacizumab 15 mg/kg q21d:   A cycle is every 21 days:     Bevacizumab-xxxx   **Always confirm dose/schedule in your pharmacy ordering system**  Patient Characteristics: Recurrent or Progressive Disease, Maintenance Therapy Therapeutic Status: Recurrent or Progressive Disease BRCA Mutation Status: Absent Line of Therapy: Maintenance  Intent of Therapy: Non-Curative / Palliative Intent, Discussed with Patient

## 2018-12-31 NOTE — Telephone Encounter (Signed)
Left a message for [name], GYN Nurse Navigator at [facility] requesting genetic testing results.  Requested a return call.

## 2018-12-31 NOTE — Telephone Encounter (Signed)
Called daughter and instructed to come tomorrow for 1 unit blood at 8 am and 2/4 at 8 am for 1 unit of blood. Instructed to keep blood bracelet on. Daughter verbalized understanding.

## 2019-01-01 ENCOUNTER — Other Ambulatory Visit: Payer: Self-pay | Admitting: Hematology and Oncology

## 2019-01-01 ENCOUNTER — Encounter: Payer: Self-pay | Admitting: Hematology and Oncology

## 2019-01-01 ENCOUNTER — Inpatient Hospital Stay: Payer: Medicare Other

## 2019-01-01 ENCOUNTER — Telehealth: Payer: Self-pay | Admitting: Oncology

## 2019-01-01 ENCOUNTER — Telehealth: Payer: Self-pay

## 2019-01-01 DIAGNOSIS — D6481 Anemia due to antineoplastic chemotherapy: Secondary | ICD-10-CM

## 2019-01-01 DIAGNOSIS — I10 Essential (primary) hypertension: Secondary | ICD-10-CM | POA: Insufficient documentation

## 2019-01-01 DIAGNOSIS — D539 Nutritional anemia, unspecified: Secondary | ICD-10-CM

## 2019-01-01 DIAGNOSIS — C5701 Malignant neoplasm of right fallopian tube: Secondary | ICD-10-CM | POA: Diagnosis not present

## 2019-01-01 DIAGNOSIS — D61818 Other pancytopenia: Secondary | ICD-10-CM | POA: Insufficient documentation

## 2019-01-01 DIAGNOSIS — C5700 Malignant neoplasm of unspecified fallopian tube: Secondary | ICD-10-CM

## 2019-01-01 DIAGNOSIS — R0600 Dyspnea, unspecified: Secondary | ICD-10-CM

## 2019-01-01 DIAGNOSIS — T451X5A Adverse effect of antineoplastic and immunosuppressive drugs, initial encounter: Principal | ICD-10-CM

## 2019-01-01 DIAGNOSIS — R0609 Other forms of dyspnea: Secondary | ICD-10-CM | POA: Insufficient documentation

## 2019-01-01 LAB — ABO/RH: ABO/RH(D): A POS

## 2019-01-01 LAB — FERRITIN: Ferritin: 542 ng/mL — ABNORMAL HIGH (ref 11–307)

## 2019-01-01 LAB — IRON AND TIBC
Iron: 101 ug/dL (ref 41–142)
Saturation Ratios: 34 % (ref 21–57)
TIBC: 298 ug/dL (ref 236–444)
UIBC: 197 ug/dL (ref 120–384)

## 2019-01-01 LAB — TSH: TSH: 4.659 u[IU]/mL — ABNORMAL HIGH (ref 0.308–3.960)

## 2019-01-01 LAB — CA 125: Cancer Antigen (CA) 125: 14.4 U/mL (ref 0.0–38.1)

## 2019-01-01 MED ORDER — DIPHENHYDRAMINE HCL 25 MG PO CAPS
25.0000 mg | ORAL_CAPSULE | Freq: Once | ORAL | Status: AC
  Administered 2019-01-01: 25 mg via ORAL

## 2019-01-01 MED ORDER — HEPARIN SOD (PORK) LOCK FLUSH 100 UNIT/ML IV SOLN
500.0000 [IU] | Freq: Every day | INTRAVENOUS | Status: AC | PRN
  Administered 2019-01-01: 500 [IU]
  Filled 2019-01-01: qty 5

## 2019-01-01 MED ORDER — SODIUM CHLORIDE 0.9% IV SOLUTION
250.0000 mL | Freq: Once | INTRAVENOUS | Status: AC
  Administered 2019-01-01: 250 mL via INTRAVENOUS
  Filled 2019-01-01: qty 250

## 2019-01-01 MED ORDER — ACETAMINOPHEN 325 MG PO TABS
650.0000 mg | ORAL_TABLET | Freq: Once | ORAL | Status: AC
  Administered 2019-01-01: 650 mg via ORAL

## 2019-01-01 MED ORDER — DIPHENHYDRAMINE HCL 25 MG PO CAPS
ORAL_CAPSULE | ORAL | Status: AC
  Filled 2019-01-01: qty 1

## 2019-01-01 MED ORDER — ACETAMINOPHEN 325 MG PO TABS
ORAL_TABLET | ORAL | Status: AC
  Filled 2019-01-01: qty 2

## 2019-01-01 MED ORDER — SODIUM CHLORIDE 0.9% FLUSH
10.0000 mL | INTRAVENOUS | Status: AC | PRN
  Administered 2019-01-01: 10 mL
  Filled 2019-01-01: qty 10

## 2019-01-01 NOTE — Progress Notes (Signed)
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[facility]) I have reviewed her most recent CT imaging which show no residual disease Her tumor marker is favorable She is still recovering from side effects of treatment I recommend transfusion support I will see her back next week for further follow-up and review of plan of care I will get a baseline chest x-ray for port location She would need to attend chemo education class before we start her on maintenance bevacizumab  Pancytopenia, acquired ([place]) She has severe pancytopenia due to treatment and is symptomatic I recommend 2 units of blood transfusion We discussed some of the risks, benefits, and alternatives of blood transfusions. The patient is symptomatic from anemia and the hemoglobin level is critically low.  Some of the side-effects to be expected including risks of transfusion reactions, chills, infection, syndrome of volume overload and risk of hospitalization from various reasons and the patient is willing to proceed and went ahead to sign consent today. She does not need G-CSF support or platelet transfusion support  Essential hypertension Her blood pressure is grossly elevated She is currently taking blood pressure medicine She also has proteinuria from recent bevacizumab We will monitor her blood pressure carefully I recommend twice a day blood pressure monitoring and she will bring her blood pressure diary in her next visit  Diverticulitis of colon She has history of diarrhea, could be due to antibiotics associated diarrhea.  C. difficile infection has been excluded from stool study I recommend dietary change with less dairy product and to take Imodium as needed I recommend her to take pain medicine as needed  Dyspnea on exertion She has  dyspnea on exertion likely due to recent chemotherapy and anemia However, congestive heart failure from doxorubicin exposure cannot be excluded I recommend echocardiogram for further evaluation and she agreed  Diarrhea I recommend Imodium as needed We will recheck electrolyte studies such as magnesium  Other fatigue She has symptoms of excessive fatigue I will check TSH level   Orders Placed This Encounter  Procedures  . DG Chest 1 View    Standing Status:   Future    Number of Occurrences:   1    Standing Expiration Date:   02/04/2020    Order Specific Question:   Reason for Exam (SYMPTOM  OR DIAGNOSIS REQUIRED)    Answer:   check port position    Order Specific Question:   Preferred imaging location?    Answer:   [facility]    Order Specific Question:   Radiology Contrast Protocol - do NOT remove file path    Answer:   [email or web address]  . CBC with Differential/Platelet    Standing Status:   Standing    Number of Occurrences:   22    Standing Expiration Date:   12/31/2019  . Comprehensive metabolic panel    Standing Status:   Standing    Number of Occurrences:   22    Standing Expiration Date:   12/31/2019  . Iron and TIBC    Standing Status:   Future    Number of Occurrences:   1    Standing Expiration Date:   02/04/2020  . Ferritin    Standing Status:   Future    Number of Occurrences:   1    Standing Expiration Date:  02/04/2020  . Vitamin B12    Standing Status:   Future    Number of Occurrences:   1    Standing Expiration Date:   02/04/2020  . Sedimentation rate    Standing Status:   Future    Number of Occurrences:   1    Standing Expiration Date:   02/04/2020  . CA 125    Standing Status:   Future    Number of Occurrences:   1    Standing Expiration Date:   02/04/2020  . Magnesium    Standing Status:   Future    Number of Occurrences:   1    Standing Expiration Date:   02/04/2020  . TSH    Standing Status:   Future     Number of Occurrences:   1    Standing Expiration Date:   02/04/2020  . Total Protein, Urine dipstick    Standing Status:   Standing    Number of Occurrences:   9    Standing Expiration Date:   01/01/2020  . Sample to Blood Bank    Standing Status:   Standing    Number of Occurrences:   33    Standing Expiration Date:   02/04/2020  . Type and screen    Standing Status:   Future    Number of Occurrences:   1    Standing Expiration Date:   12/31/2019  . Prepare RBC    Standing Status:   Standing    Number of Occurrences:   1    Order Specific Question:   # of Units    Answer:   2 units    Order Specific Question:   Transfusion Indications    Answer:   Symptomatic Anemia    Order Specific Question:   Special Requirements    Answer:   Irradiated    Order Specific Question:   If emergent release call blood bank    Answer:   [facility] [phone]  . ABO/Rh     CHIEF COMPLAINTS/PURPOSE OF CONSULTATION:  Recurrent fallopian Tube cancer, for further management  HISTORY OF PRESENTING ILLNESS:  Lisa Moyer 84 y.o. female is here because of diagnosis of recurrent fallopian tube cancer.  She is accompanied by her son and daughter Her daughter is currently living with her but will be moving back to her home town next week Her son is close by The patient was diagnosed with fallopian tube cancer several years ago and then had disease relapse She has been getting her treatments at [facility] and recently relocated back home after completion of last cycle of treatment last week I have reviewed approximately 50 pages of outside material and collaborated the history with the patient and family Symptoms started with abdominal bloating and pain I have reviewed her chart and materials related to her cancer extensively and collaborated history with the patient. Summary of oncologic history is as follows: Oncology History   Neg genetics. No HRD done on cell block     Fallopian tube  cancer, carcinoma ([place])   06/18/2016 Tumor Marker    Patient's tumor was tested for the following markers: CA-125 Results of the tumor marker test revealed 298.    06/23/2016 PET scan    Diffuse hypermetabolic peritoneal carcinomatosis in the abdomen and pelvis, internal mammary adenopathy in the chest, hypermetabolic right paracardiac/diaphragmatic LN, bilateral pleural effusion and moderate ascites    06/24/2016 Procedure    Therapeutic paracentesis    06/24/2016  Pathology Results    Positive for adenocarcinoma, Mullerian primary    06/28/2016 Procedure    She underwent CT guided biopsy of omentum    06/28/2016 Pathology Results    High grade serous involving the omentum, positive for p53, PAX8, WT1 and p16.    07/01/2016 Procedure    Findings/impression:   1. Sonographic evaluation of the right internal jugular vein confirms patency. Sonography was required to gain central venous access. An image documenting patency and needle access was saved.   2. Successful placement of right internal jugular vein subcutaneous Mediport as described. Spot film shows the catheter tip at the cavoatrial junction. No pneumothorax. The Mediport aspirates and flushes normally.    07/02/2016 - 08/12/2016 Chemotherapy    The patient had 3 cycles of carboplatin and taxol chemotherapy treatment.      08/29/2016 Imaging    Ct scan of chest, abdomen and pelvis showed marked improvement and resolution of thoracic adenopathy    09/08/2016 Surgery    She underwent interval debulking surgery with TAH/BSO and infracolic omentectomy by Dr. Wynelle Cleveland    09/08/2016 Pathology Results    Right tube: scant serous carcinoma. No viable tumor. Left tube and ovary: normal. Omentum: low volume serous carcinoma with few psamomma bodies.    10/07/2016 - 11/18/2016 Chemotherapy    The patient had 3 more cycles of carboplatin and taxol for chemotherapy treatment.      02/07/2017 Imaging    Impression:  1. Colonic  diverticulosis without acute diverticulitis. 2. Stable calcification right kidney without hydronephrosis. 3. No acute process or significant interval change has occurred since August 29, 2016.    05/09/2018 Imaging    Impression:  Findings are most suspicious for acute sigmoid colonic diverticulitis with intraloop abscess formation.    05/14/2018 Imaging    Impression: The pelvic fluid collection contiguous with sigmoid colonic diverticulitis changes has not significant change since May 09, 2018. No new fluid collections are seen    06/28/2018 Imaging    IMPRESSION: Changes consistent with diverticulitis with free fluid within the pelvis. No focal contained abscess is seen. A previously noted fluid collection deep within the pelvis has resolved in the interval.   Stable nonobstructing right renal stone.  The remainder of the exam is stable from the prior study.     07/11/2018 PET scan    Small volume ascites, new stranding and omental nodularity    07/13/2018 Imaging    Findings are consistent with peritoneal carcinomatosis given the history of ovarian carcinoma. There is omental caking as well as stranding within various areas of the peritoneal fat. There is also wall thickening of the sigmoid colon with adjacent stranding. This finding can also be related to peritoneal carcinomatosis and implants although an inflammatory process of the sigmoid colon is not excluded.   Right nephrolithiasis.  Ileus pattern.    07/16/2018 Imaging    Impression:   Findings of peritoneal carcinomatosis, rapidly progressive. Not present in July 2019, progressed when compared to September 13.  Multiple bowel loops are distorted but no distention or transition zone to suggest a component of active obstruction.    07/18/2018 Procedure    Findings/impression:  1. Sonographic evaluation of ascites. Sonography was required to gain access to ascites. An image documenting ascites was saved. 2.  Successful ultrasound guided paracentesis using a temporary peritoneal drainage catheter inserted in the right upper quadrant as described yielding 600cc of ascites.     07/20/2018 Echocardiogram    General:  The patient  was in normal sinus rhythm.  Left Ventricle:  Left ventricular ejection fraction was normal, estimated in the range of 60 to 65%. The left ventricular cavity size appears normal. The left ventricular wall thickness appears normal. The left ventricle is thickened in a fashion  consistent with mild concentric hypertrophy. Doppler tissue velocities and mitral inflow profile are consistent with Stage I diastolic dysfunction. The calculated ejection fraction is 68.1 %.  Right Ventricle:  The right ventricle appears normal in size and function.  Left Atrium:  The left atrium appears normal.  Right Atrium:  The right atrium appears normal.  Aortic Valve:  The structure of the aortic valve is tricuspid. There is evidence of trivial (trace) aortic regurgitation.  Mitral Valve:  There is no evidence of mitral regurgitation. The mitral valve appears normal in structure.  Pulmonic Valve:  There is evidence of trace (trivial) pulmonic regurgitation. The pulmonic valve appears normal in structure.  Tricuspid Valve:  There is evidence of trace (trivial) tricuspid regurgitation. The tricuspid valve appears normal in structure.  Pericardium:  There is no evidence of pericardial effusion.  Aorta:  The visualized portions of the aorta (ascending aorta, aortic root, and aortic arch) appear normal.  Pulmonic Artery:  Unable to obtain RVSP due to insufficient tricuspid regurgitation jet.  Conclusions: Left ventricular ejection fraction was normal, estimated in the range of 60 to 65%. The left ventricular cavity size appears normal. The left ventricular wall thickness appears normal. The left ventricle is thickened in a fashion consistent with mild concentric hypertrophy. Doppler tissue  velocities and mitral inflow profile are consistent with Stage I diastolic dysfunction. The calculated ejection fraction is 68.1%.There is evidence of trivial (trace) aortic regurgitation.There is evidence of trace (trivial) pulmonic regurgitation.There is evidence of trace (trivial) tricuspid regurgitation.There is no evidence of pericardial effusion.Unable to obtain RVSP due to insufficient tricuspid regurgitation jet.    07/23/2018 - 12/25/2018 Chemotherapy    The patient had carboplatin, doxil and Avastin for chemotherapy treatment x 6 cycles    08/01/2018 Imaging    Ct head showed no injuries.    08/01/2018 Procedure    Findings/impression:  1. Sonographic evaluation of ascites. Sonography was required to gain access to ascites. An image documenting ascites was saved. 2. Successful ultrasound guided paracentesis using a temporary peritoneal drainage catheter inserted in the right upper quadrant as described yielding 400cc of ascites.     08/06/2018 Tumor Marker    Patient's tumor was tested for the following markers: CA-125 Results of the tumor marker test revealed 373    08/20/2018 Tumor Marker    Patient's tumor was tested for the following markers: CA-125 Results of the tumor marker test revealed 51.    10/03/2018 Genetic Testing    Patient has genetic testing done for genetics testing using her saliva. 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  Liver and spleen homogeneously enhance. No lesion seen.  The adjacent gallbladder is surgically absent.  The adrenal glands, kidneys, pancreas are normal.  Pelvic contents are remarkable for an absent uterus.  The mesenteric stranding has nearly completely resolved. Minimal reticular prominence throughout the pelvis.  No residual ascites.  No bowel wall thickening or. No dilated loops or transition zone.  The appendix is not  seen. But no inflammatory changes in the right lower quadrant.  Osseous structures are intact.    11/27/2018 Imaging  Ct scan abdomen and pelvis showed colonic diverticulosis and nonspecific fluid levels in the bowel    12/31/2018 Tumor Marker    Patient's tumor was tested for the following markers: CA-125 Results of the tumor marker test revealed 14.4    01/01/2019 Imaging    1. LEFT jugular Port-A-Cath catheter tip projects over the mid SVC. 2. No acute cardiopulmonary disease.     Since chemotherapy, she complained of excessive fatigue, anorexia, significant intermittent abdominal cramping with associated diarrhea. Her pain is located in the back, usually alleviated by bowel movement. Her stool consistency is changed to watery, 3-4 times a day, sometimes at nighttime, sometimes will wake her up from sleep. She has been exposed to multiple courses of antibiotics over the last few months due to recurrent sinus infection. Her sinus congestion has improved She has stool studies that excluded C. difficile She has no energy level since returning to [facility], after completion of chemotherapy. The patient denies any recent signs or symptoms of bleeding such as spontaneous epistaxis, hematuria or hematochezia. She has shortness of breath and dizziness on minimum exertion She has not have repeat echocardiogram since chemotherapy.  MEDICAL HISTORY:  Past Medical History:  Diagnosis Date  . Arthritis    mild  . Blood transfusion without reported diagnosis   . DDD (degenerative disc disease), lumbar 05/09/2018   CT shows multilevel degenerative hypertrophic changes worst at L2-3  . Diverticulitis   . Fallopian tube cancer, carcinoma ([place])   . Fatty liver 06/28/2018   seen on CT  . Hyperlipemia   . Hypertension   . Kidney stone 05/2018   Right 45m nonobstructing renal stone seen on CT - pt asymptomatic  . Pneumonia   . Sliding hiatal hernia 06/28/2018   seen on CT     SURGICAL HISTORY: Past Surgical History:  Procedure Laterality Date  . ABDOMINAL HYSTERECTOMY  08/2016   total with BSO due to fallopian tube carcinoma, and pre-adjuvent and post-operative chemo as well each x 3 mos  . CHOLECYSTECTOMY  2001  . OTHER SURGICAL HISTORY  2017-2018   Chemotherapy     SOCIAL HISTORY: Social History   Socioeconomic History  . Marital status: Widowed    Spouse name: Not on file  . Number of children: 3  . Years of education: Not on file  . Highest education level: Not on file  Occupational History  . Occupation: retired bEducation officer, community . Financial resource strain: Not on file  . Food insecurity:    Worry: Not on file    Inability: Not on file  . Transportation needs:    Medical: Not on file    Non-medical: Not on file  Tobacco Use  . Smoking status: Never Smoker  . Smokeless tobacco: Never Used  Substance and Sexual Activity  . Alcohol use: Never    Frequency: Never  . Drug use: Never  . Sexual activity: Not on file  Lifestyle  . Physical activity:    Days per week: Not on file    Minutes per session: Not on file  . Stress: Not on file  Relationships  . Social connections:    Talks on phone: Not on file    Gets together: Not on file    Attends religious service: Not on file    Active member of club or organization: Not on file    Attends meetings of clubs or organizations: Not on file    Relationship status: Not on file  .  Intimate partner violence:    Fear of current or ex partner: Not on file    Emotionally abused: Not on file    Physically abused: Not on file    Forced sexual activity: Not on file  Other Topics Concern  . Not on file  Social History Narrative  . Not on file    FAMILY HISTORY: Family History  Problem Relation Age of Onset  . Heart disease Mother        MI cause of death  . Colon cancer Mother        colon  . Pancreatic cancer Father        pancreatic    ALLERGIES:  is allergic to  latex.  MEDICATIONS:  Current Outpatient Medications  Medication Sig Dispense Refill  . Acetaminophen 325 MG CAPS Take 650 mg by mouth every 6 (six) hours as needed.    . ALPRAZolam (XANAX) 0.25 MG tablet Take 0.25 mg by mouth at bedtime as needed for anxiety.    Marland Kitchen amLODipine (NORVASC) 10 MG tablet Take 10 mg by mouth daily.    Marland Kitchen escitalopram (LEXAPRO) 10 MG tablet Take 10 mg by mouth daily.    Marland Kitchen L-Methylfolate-Algae-B12-B6 (METANX PO) Take 1 tablet by mouth 2 (two) times daily.    . Loperamide HCl (IMODIUM PO) Take 1 tablet by mouth as needed.    Marland Kitchen losartan (COZAAR) 100 MG tablet Take 100 mg by mouth daily.    . Multiple Vitamin (MULTIVITAMIN) tablet Take 1 tablet by mouth daily.    . ondansetron (ZOFRAN) 8 MG tablet Take 8 mg by mouth every 8 (eight) hours as needed for nausea or vomiting.    . Ascorbic Acid (VITAMIN C PO) Take 500 mg by mouth daily.     . bevacizumab-awwb in sodium chloride 0.9 % 100 mL Inject into the vein once.    . Cholecalciferol (VITAMIN D PO) Take 1 tablet by mouth daily.    Marland Kitchen oxyCODONE-acetaminophen (PERCOCET/ROXICET) 5-325 MG tablet Take 1 tablet by mouth every 4 (four) hours as needed for severe pain. 20 tablet 0  . rosuvastatin (CRESTOR) 10 MG tablet Take 10 mg by mouth at bedtime.      No current facility-administered medications for this visit.     REVIEW OF SYSTEMS:   Constitutional: Denies fevers, chills or abnormal night sweats Eyes: Denies blurriness of vision, double vision or watery eyes Ears, nose, mouth, throat, and face: Denies mucositis or sore throat Skin: Denies abnormal skin rashes Lymphatics: Denies new lymphadenopathy or easy bruising Behavioral/Psych: Mood is stable, no new changes  All other systems were reviewed with the patient and are negative.  PHYSICAL EXAMINATION: ECOG PERFORMANCE STATUS: 2 - Symptomatic, <50% confined to bed  Vitals:   12/31/18 1404  BP: (!) 166/83  Pulse: 100  Resp: 18  Temp: 97.9 F (36.6 C)  SpO2:  96%   Filed Weights   12/31/18 1404  Weight: 150 lb 9.6 oz (68.3 kg)    GENERAL:alert, no distress and comfortable.  She looks pale SKIN: skin color, texture, turgor are normal, no rashes or significant lesions EYES: normal, conjunctiva are pale and non-injected, sclera clear OROPHARYNX:no exudate, no erythema and lips, buccal mucosa, and tongue normal  NECK: supple, thyroid normal size, non-tender, without nodularity LYMPH:  no palpable lymphadenopathy in the cervical, axillary or inguinal LUNGS: clear to auscultation and percussion with normal breathing effort HEART: regular rate & rhythm and no murmurs and no lower extremity edema ABDOMEN:abdomen soft, mild tenderness on  palpation on the right lower quadrant without rebound no guarding Musculoskeletal:no cyanosis of digits and no clubbing  PSYCH: alert & oriented x 3 with fluent speech NEURO: no focal motor/sensory deficits  LABORATORY DATA:  I have reviewed the data as listed Lab Results  Component Value Date   WBC 2.5 (L) 12/31/2018   HGB 7.3 (L) 12/31/2018   HCT 22.2 (L) 12/31/2018   MCV 108.3 (H) 12/31/2018   PLT 51 (L) 12/31/2018   Recent Labs    05/24/18 0851 06/27/18 1649 07/13/18 1829 12/31/18 1541  NA 142 139 136 141  K 3.6 4.6 3.7 4.3  CL 104 100 101 110  CO2 20 22 24  21*  GLUCOSE 108* 92 125* 105*  BUN 18 17 14 23   CREATININE 0.77 0.75 0.66 0.99  CALCIUM 9.3 9.8 9.2 9.0  GFRNONAA 73 75 >60 53*  GFRAA 84 86 >60 >60  PROT 6.8  --  7.6 6.9  ALBUMIN 4.2  --  3.7 3.6  AST 31  --  23 22  ALT 19  --  23 15  ALKPHOS 62  --  59 62  BILITOT 0.2  --  0.5 0.4    RADIOGRAPHIC STUDIES: I have personally reviewed the radiological images as listed and agreed with the findings in the report. Dg Chest 1 View  Result Date: 01/01/2019 CLINICAL DATA:  Evaluate Port-A-Cath position. Current history of carcinoma of the fallopian tube. EXAM: CHEST  1 VIEW COMPARISON:  None. FINDINGS: LEFT jugular Port-A-Cath catheter  tip projects over the mid SVC. Cardiac silhouette normal in size. Thoracic aorta mildly tortuous. Hilar and mediastinal contours otherwise unremarkable. Lungs clear. Bronchovascular markings normal. Pulmonary vascularity normal. No visible pleural effusions. No pneumothorax. IMPRESSION: 1. LEFT jugular Port-A-Cath catheter tip projects over the mid SVC. 2. No acute cardiopulmonary disease. Electronically Signed   By: Evangeline Dakin M.D.   On: 01/01/2019 08:21    I spent 80 minutes counseling the patient face to face. The total time spent in the appointment was 90 minutes and more than 50% was on counseling.  All questions were answered. The patient knows to call the clinic with any problems, questions or concerns.  Heath Lark, MD 01/01/2019 4:08 PM

## 2019-01-01 NOTE — Assessment & Plan Note (Signed)
Her blood pressure is grossly elevated She is currently taking blood pressure medicine She also has proteinuria from recent bevacizumab We will monitor her blood pressure carefully I recommend twice a day blood pressure monitoring and she will bring her blood pressure diary in her next visit

## 2019-01-01 NOTE — Telephone Encounter (Signed)
Called Lisa Moyer with appointment for echo at [place] on 01/04/19 at 2:15 pm.  She verbalized agreement.

## 2019-01-01 NOTE — Patient Instructions (Signed)
Blood Transfusion, Adult A blood transfusion is a procedure in which you are given blood through an IV tube. You may need this procedure because of:  Illness.  Surgery.  Injury. The blood may come from someone else (a donor). You may also be able to donate blood for yourself (autologous blood donation). The blood given in a transfusion is made up of different types of cells. You may get:  Red blood cells. These carry oxygen to the cells in the body.  White blood cells. These help you fight infections.  Platelets. These help your blood to clot.  Plasma. This is the liquid part of your blood. It helps with fluid imbalances. If you have a clotting disorder, you may also get other types of blood products. What happens before the procedure?  You will have a blood test to find out your blood type. The test also finds out what type of blood your body will accept and matches it to the donor type.  If you are going to have a planned surgery, you may be able to donate your own blood. This may be done in case you need a transfusion.  If you have had an allergic reaction to a transfusion in the past, you may be given medicine to help prevent a reaction. This medicine may be given to you by mouth or through an IV.  You will have your temperature, blood pressure, and pulse checked.  Follow instructions from your doctor about what you cannot eat or drink.  Ask your doctor about: ? Changing or stopping your regular medicines. This is important if you take diabetes medicines or blood thinners. ? Taking medicines such as aspirin and ibuprofen. These medicines can thin your blood. Do not take these medicines before your procedure if your doctor tells you not to. What happens during the procedure?  An IV tube will be put into one of your veins.  The bag of donated blood will be attached to your IV tube. Then, the blood will enter through your vein.  Your temperature, blood pressure, and pulse will  be checked regularly during the procedure. This is done to find early signs of a transfusion reaction.  If you have any signs or symptoms of a reaction, your transfusion will be stopped. You may also be given medicine.  When the transfusion is done, your IV tube will be taken out.  Pressure may be applied to the IV site for a few minutes.  A bandage (dressing) will be put on the IV site. The procedure may vary among doctors and hospitals. What happens after the procedure?  Your temperature, blood pressure, heart rate, breathing rate, and blood oxygen level will be checked often.  Your blood may be tested to see how you are responding to the transfusion.  You may be warmed with fluids or blankets. This is done to keep the temperature of your body normal. Summary  A blood transfusion is a procedure in which you are given blood through an IV tube.  The blood may come from someone else (a donor). You may also be able to donate blood for yourself.  If you have had an allergic reaction to a transfusion in the past, you may be given medicine to help prevent a reaction. This medicine may be given to you by mouth or through an IV tube.  Your temperature, blood pressure, heart rate, breathing rate, and blood oxygen level will be checked often.  Your blood may be tested to see   how you are responding to the transfusion. This information is not intended to replace advice given to you by your health care provider. Make sure you discuss any questions you have with your health care provider. Document Released: 01/13/2009 Document Revised: 06/10/2016 Document Reviewed: 06/10/2016 Elsevier Interactive Patient Education  2019 [employer]  

## 2019-01-01 NOTE — Assessment & Plan Note (Addendum)
She has severe pancytopenia due to treatment and is symptomatic I recommend 2 units of blood transfusion We discussed some of the risks, benefits, and alternatives of blood transfusions. The patient is symptomatic from anemia and the hemoglobin level is critically low.  Some of the side-effects to be expected including risks of transfusion reactions, chills, infection, syndrome of volume overload and risk of hospitalization from various reasons and the patient is willing to proceed and went ahead to sign consent today. She does not need G-CSF support or platelet transfusion support

## 2019-01-01 NOTE — Assessment & Plan Note (Signed)
She has dyspnea on exertion likely due to recent chemotherapy and anemia However, congestive heart failure from doxorubicin exposure cannot be excluded I recommend echocardiogram for further evaluation and she agreed

## 2019-01-01 NOTE — Assessment & Plan Note (Addendum)
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

## 2019-01-01 NOTE — Telephone Encounter (Signed)
Called Lisa Moyer and scheduled chemo education for 01/02/19 at 4 pm.

## 2019-01-01 NOTE — Telephone Encounter (Signed)
-----   Message from Heath Lark, MD sent at 01/01/2019 10:32 AM EST ----- Regarding: CA-125 is 14.4 Pls call her daughter and let her know

## 2019-01-01 NOTE — Assessment & Plan Note (Addendum)
She has history of diarrhea, could be due to antibiotics associated diarrhea.  C. difficile infection has been excluded from stool study I recommend dietary change with less dairy product and to take Imodium as needed I recommend her to take pain medicine as needed

## 2019-01-01 NOTE — Assessment & Plan Note (Signed)
I recommend Imodium as needed We will recheck electrolyte studies such as magnesium

## 2019-01-01 NOTE — Telephone Encounter (Signed)
Called daughter and given below message. She verbalized understanding.

## 2019-01-01 NOTE — Assessment & Plan Note (Signed)
She has symptoms of excessive fatigue I will check TSH level

## 2019-01-02 ENCOUNTER — Other Ambulatory Visit: Payer: Self-pay | Admitting: *Deleted

## 2019-01-02 ENCOUNTER — Inpatient Hospital Stay: Payer: Medicare Other

## 2019-01-02 DIAGNOSIS — D6481 Anemia due to antineoplastic chemotherapy: Secondary | ICD-10-CM

## 2019-01-02 DIAGNOSIS — C5700 Malignant neoplasm of unspecified fallopian tube: Secondary | ICD-10-CM

## 2019-01-02 DIAGNOSIS — T451X5A Adverse effect of antineoplastic and immunosuppressive drugs, initial encounter: Secondary | ICD-10-CM

## 2019-01-02 DIAGNOSIS — D539 Nutritional anemia, unspecified: Secondary | ICD-10-CM

## 2019-01-02 DIAGNOSIS — C5701 Malignant neoplasm of right fallopian tube: Secondary | ICD-10-CM | POA: Diagnosis not present

## 2019-01-02 MED ORDER — SODIUM CHLORIDE 0.9% FLUSH
10.0000 mL | INTRAVENOUS | Status: AC | PRN
  Administered 2019-01-02: 10 mL
  Filled 2019-01-02: qty 10

## 2019-01-02 MED ORDER — ACETAMINOPHEN 325 MG PO TABS
ORAL_TABLET | ORAL | Status: AC
  Filled 2019-01-02: qty 2

## 2019-01-02 MED ORDER — DIPHENHYDRAMINE HCL 25 MG PO CAPS
25.0000 mg | ORAL_CAPSULE | Freq: Once | ORAL | Status: AC
  Administered 2019-01-02: 25 mg via ORAL

## 2019-01-02 MED ORDER — HEPARIN SOD (PORK) LOCK FLUSH 100 UNIT/ML IV SOLN
500.0000 [IU] | Freq: Every day | INTRAVENOUS | Status: AC | PRN
  Administered 2019-01-02: 500 [IU]
  Filled 2019-01-02: qty 5

## 2019-01-02 MED ORDER — DIPHENHYDRAMINE HCL 25 MG PO CAPS
ORAL_CAPSULE | ORAL | Status: AC
  Filled 2019-01-02: qty 1

## 2019-01-02 MED ORDER — SODIUM CHLORIDE 0.9% IV SOLUTION
250.0000 mL | Freq: Once | INTRAVENOUS | Status: AC
  Administered 2019-01-02: 250 mL via INTRAVENOUS
  Filled 2019-01-02: qty 250

## 2019-01-02 MED ORDER — ACETAMINOPHEN 325 MG PO TABS
650.0000 mg | ORAL_TABLET | Freq: Once | ORAL | Status: AC
  Administered 2019-01-02: 650 mg via ORAL

## 2019-01-02 NOTE — Patient Instructions (Signed)
Blood Transfusion, Adult A blood transfusion is a procedure in which you are given blood through an IV tube. You may need this procedure because of:  Illness.  Surgery.  Injury. The blood may come from someone else (a donor). You may also be able to donate blood for yourself (autologous blood donation). The blood given in a transfusion is made up of different types of cells. You may get:  Red blood cells. These carry oxygen to the cells in the body.  White blood cells. These help you fight infections.  Platelets. These help your blood to clot.  Plasma. This is the liquid part of your blood. It helps with fluid imbalances. If you have a clotting disorder, you may also get other types of blood products. What happens before the procedure?  You will have a blood test to find out your blood type. The test also finds out what type of blood your body will accept and matches it to the donor type.  If you are going to have a planned surgery, you may be able to donate your own blood. This may be done in case you need a transfusion.  If you have had an allergic reaction to a transfusion in the past, you may be given medicine to help prevent a reaction. This medicine may be given to you by mouth or through an IV.  You will have your temperature, blood pressure, and pulse checked.  Follow instructions from your doctor about what you cannot eat or drink.  Ask your doctor about: ? Changing or stopping your regular medicines. This is important if you take diabetes medicines or blood thinners. ? Taking medicines such as aspirin and ibuprofen. These medicines can thin your blood. Do not take these medicines before your procedure if your doctor tells you not to. What happens during the procedure?  An IV tube will be put into one of your veins.  The bag of donated blood will be attached to your IV tube. Then, the blood will enter through your vein.  Your temperature, blood pressure, and pulse will  be checked regularly during the procedure. This is done to find early signs of a transfusion reaction.  If you have any signs or symptoms of a reaction, your transfusion will be stopped. You may also be given medicine.  When the transfusion is done, your IV tube will be taken out.  Pressure may be applied to the IV site for a few minutes.  A bandage (dressing) will be put on the IV site. The procedure may vary among doctors and hospitals. What happens after the procedure?  Your temperature, blood pressure, heart rate, breathing rate, and blood oxygen level will be checked often.  Your blood may be tested to see how you are responding to the transfusion.  You may be warmed with fluids or blankets. This is done to keep the temperature of your body normal. Summary  A blood transfusion is a procedure in which you are given blood through an IV tube.  The blood may come from someone else (a donor). You may also be able to donate blood for yourself.  If you have had an allergic reaction to a transfusion in the past, you may be given medicine to help prevent a reaction. This medicine may be given to you by mouth or through an IV tube.  Your temperature, blood pressure, heart rate, breathing rate, and blood oxygen level will be checked often.  Your blood may be tested to see   how you are responding to the transfusion. This information is not intended to replace advice given to you by your health care provider. Make sure you discuss any questions you have with your health care provider. Document Released: 01/13/2009 Document Revised: 06/10/2016 Document Reviewed: 06/10/2016 Elsevier Interactive Patient Education  2019 [employer]  

## 2019-01-03 ENCOUNTER — Telehealth: Payer: Self-pay | Admitting: Oncology

## 2019-01-03 LAB — BPAM RBC
BLOOD PRODUCT EXPIRATION DATE: 202003252359
Blood Product Expiration Date: 202003252359
ISSUE DATE / TIME: 202003030857
ISSUE DATE / TIME: 202003040836
Unit Type and Rh: 6200
Unit Type and Rh: 6200

## 2019-01-03 LAB — TYPE AND SCREEN
ABO/RH(D): A POS
Antibody Screen: NEGATIVE
Unit division: 0
Unit division: 0

## 2019-01-03 NOTE — Telephone Encounter (Signed)
[name] called and said she has been talking to [name], the Nurse Navigator at [facility].  She was informed that the [facility] testing failed because of the specimen.  She is wondering what to do going forward so that her mother does not miss out on possible treatments.  Called her back and left a message that Dr. Alvy Bimler will explain what I means at her next appointment.

## 2019-01-04 ENCOUNTER — Ambulatory Visit (HOSPITAL_BASED_OUTPATIENT_CLINIC_OR_DEPARTMENT_OTHER)
Admission: RE | Admit: 2019-01-04 | Discharge: 2019-01-04 | Disposition: A | Discharge status: Home / Self Care | Payer: Medicare Other | Source: Ambulatory Visit | Attending: Hematology and Oncology | Admitting: Hematology and Oncology

## 2019-01-04 DIAGNOSIS — C5700 Malignant neoplasm of unspecified fallopian tube: Secondary | ICD-10-CM

## 2019-01-04 DIAGNOSIS — R0609 Other forms of dyspnea: Secondary | ICD-10-CM

## 2019-01-04 DIAGNOSIS — R0600 Dyspnea, unspecified: Secondary | ICD-10-CM

## 2019-01-04 NOTE — Progress Notes (Signed)
  Echocardiogram 2D Echocardiogram has been performed.  Nellie Chevalier T Dejanae Helser 01/04/2019, 3:08 PM

## 2019-01-10 ENCOUNTER — Other Ambulatory Visit: Payer: Self-pay

## 2019-01-10 ENCOUNTER — Inpatient Hospital Stay: Payer: Medicare Other

## 2019-01-10 ENCOUNTER — Telehealth: Payer: Self-pay | Admitting: Hematology and Oncology

## 2019-01-10 ENCOUNTER — Inpatient Hospital Stay (HOSPITAL_BASED_OUTPATIENT_CLINIC_OR_DEPARTMENT_OTHER): Payer: Medicare Other | Admitting: Hematology and Oncology

## 2019-01-10 ENCOUNTER — Other Ambulatory Visit: Payer: Self-pay | Admitting: Hematology and Oncology

## 2019-01-10 VITALS — BP 180/82 | HR 86 | Temp 97.7°F | Resp 18 | Ht 62.0 in | Wt 149.8 lb

## 2019-01-10 DIAGNOSIS — T451X5A Adverse effect of antineoplastic and immunosuppressive drugs, initial encounter: Principal | ICD-10-CM

## 2019-01-10 DIAGNOSIS — Z7189 Other specified counseling: Secondary | ICD-10-CM

## 2019-01-10 DIAGNOSIS — C5700 Malignant neoplasm of unspecified fallopian tube: Secondary | ICD-10-CM

## 2019-01-10 DIAGNOSIS — C786 Secondary malignant neoplasm of retroperitoneum and peritoneum: Secondary | ICD-10-CM

## 2019-01-10 DIAGNOSIS — R5381 Other malaise: Secondary | ICD-10-CM

## 2019-01-10 DIAGNOSIS — D61818 Other pancytopenia: Secondary | ICD-10-CM

## 2019-01-10 DIAGNOSIS — C5701 Malignant neoplasm of right fallopian tube: Secondary | ICD-10-CM | POA: Diagnosis not present

## 2019-01-10 DIAGNOSIS — Z95828 Presence of other vascular implants and grafts: Secondary | ICD-10-CM

## 2019-01-10 DIAGNOSIS — I10 Essential (primary) hypertension: Secondary | ICD-10-CM

## 2019-01-10 DIAGNOSIS — D6481 Anemia due to antineoplastic chemotherapy: Secondary | ICD-10-CM

## 2019-01-10 DIAGNOSIS — D539 Nutritional anemia, unspecified: Secondary | ICD-10-CM

## 2019-01-10 LAB — CBC WITH DIFFERENTIAL/PLATELET
Abs Immature Granulocytes: 0.03 10*3/uL (ref 0.00–0.07)
Basophils Absolute: 0 10*3/uL (ref 0.0–0.1)
Basophils Relative: 1 %
EOS ABS: 0 10*3/uL (ref 0.0–0.5)
Eosinophils Relative: 1 %
HCT: 27.7 % — ABNORMAL LOW (ref 36.0–46.0)
Hemoglobin: 9 g/dL — ABNORMAL LOW (ref 12.0–15.0)
Immature Granulocytes: 1 %
Lymphocytes Relative: 69 %
Lymphs Abs: 2.4 10*3/uL (ref 0.7–4.0)
MCH: 34.1 pg — ABNORMAL HIGH (ref 26.0–34.0)
MCHC: 32.5 g/dL (ref 30.0–36.0)
MCV: 104.9 fL — ABNORMAL HIGH (ref 80.0–100.0)
Monocytes Absolute: 0.6 10*3/uL (ref 0.1–1.0)
Monocytes Relative: 17 %
Neutro Abs: 0.4 10*3/uL — CL (ref 1.7–7.7)
Neutrophils Relative %: 11 %
Platelets: 47 10*3/uL — ABNORMAL LOW (ref 150–400)
RBC: 2.64 MIL/uL — ABNORMAL LOW (ref 3.87–5.11)
RDW: 17.4 % — ABNORMAL HIGH (ref 11.5–15.5)
WBC: 3.5 10*3/uL — ABNORMAL LOW (ref 4.0–10.5)
nRBC: 0 % (ref 0.0–0.2)

## 2019-01-10 LAB — COMPREHENSIVE METABOLIC PANEL
ALT: 15 U/L (ref 0–44)
AST: 28 U/L (ref 15–41)
Albumin: 3.5 g/dL (ref 3.5–5.0)
Alkaline Phosphatase: 76 U/L (ref 38–126)
Anion gap: 11 (ref 5–15)
BILIRUBIN TOTAL: 0.8 mg/dL (ref 0.3–1.2)
BUN: 18 mg/dL (ref 8–23)
CO2: 24 mmol/L (ref 22–32)
Calcium: 9 mg/dL (ref 8.9–10.3)
Chloride: 103 mmol/L (ref 98–111)
Creatinine, Ser: 1.1 mg/dL — ABNORMAL HIGH (ref 0.44–1.00)
GFR calc Af Amer: 55 mL/min — ABNORMAL LOW (ref >60)
GFR calc non Af Amer: 47 mL/min — ABNORMAL LOW (ref >60)
Glucose, Bld: 134 mg/dL — ABNORMAL HIGH (ref 70–99)
POTASSIUM: 4.1 mmol/L (ref 3.5–5.1)
Sodium: 138 mmol/L (ref 135–145)
Total Protein: 6.6 g/dL (ref 6.5–8.1)

## 2019-01-10 LAB — TOTAL PROTEIN, URINE DIPSTICK: Protein, ur: 100 mg/dL — AB

## 2019-01-10 LAB — SAMPLE TO BLOOD BANK

## 2019-01-10 MED ORDER — ESCITALOPRAM OXALATE 10 MG PO TABS: 10.0000 mg | ORAL_TABLET | Freq: Every day | ORAL | 1 refills | Status: DC

## 2019-01-10 MED ORDER — SODIUM CHLORIDE 0.9% FLUSH
10.0000 mL | Freq: Once | INTRAVENOUS | Status: DC
  Filled 2019-01-10: qty 10

## 2019-01-10 MED ORDER — HEPARIN SOD (PORK) LOCK FLUSH 100 UNIT/ML IV SOLN
500.0000 [IU] | Freq: Once | INTRAVENOUS | Status: DC
  Filled 2019-01-10: qty 5

## 2019-01-10 MED ORDER — ATENOLOL 25 MG PO TABS: 25.0000 mg | ORAL_TABLET | Freq: Every day | ORAL | 11 refills | Status: DC

## 2019-01-10 MED ORDER — SODIUM CHLORIDE 0.9% FLUSH
10.0000 mL | INTRAVENOUS | Status: DC | PRN
  Administered 2019-01-10: 10 mL via INTRAVENOUS
  Filled 2019-01-10: qty 10

## 2019-01-10 NOTE — Telephone Encounter (Signed)
Gave avs and calendar ° °

## 2019-01-11 ENCOUNTER — Other Ambulatory Visit: Payer: Self-pay

## 2019-01-11 ENCOUNTER — Telehealth: Payer: Self-pay

## 2019-01-11 ENCOUNTER — Encounter: Payer: Self-pay | Admitting: Hematology and Oncology

## 2019-01-11 DIAGNOSIS — Z7189 Other specified counseling: Secondary | ICD-10-CM | POA: Insufficient documentation

## 2019-01-11 DIAGNOSIS — R5383 Other fatigue: Secondary | ICD-10-CM

## 2019-01-11 DIAGNOSIS — R5381 Other malaise: Secondary | ICD-10-CM | POA: Insufficient documentation

## 2019-01-11 NOTE — Telephone Encounter (Signed)
HH orders faxed to [facility] at [phone]

## 2019-01-11 NOTE — Assessment & Plan Note (Signed)
She is weak I recommend referral to outpatient physical therapy/cancer rehab for strengthening exercise and she agreed

## 2019-01-11 NOTE — Assessment & Plan Note (Signed)
She has poorly controlled hypertension due to bevacizumab and other factors I recommend addition of beta-blocker I recommend advanced home care service to check on her blood pressure weekly We will call her next week to see how she does with the additional blood pressure medicine

## 2019-01-11 NOTE — Assessment & Plan Note (Signed)
Her tumor marker is improved Unfortunately, due to uncontrolled hypertension and proteinuria, I am not able to start her on bevacizumab I reviewed the plan of care with the patient and her children Even though the genetic testing from the outside institution was incomplete, she would qualify for PARP inhibitor in the future if she had recurrence of disease However, given excellent response to treatment, I think maintenance bevacizumab at this point would be a good idea I recommend aggressive blood pressure management over the next 2 weeks and we will reschedule her treatment today.

## 2019-01-11 NOTE — Assessment & Plan Note (Signed)
We have some discussion about goals of care would be to maximize function and supportive care while she continues on maintenance treatment With recurrence of disease, typically this is non-curative but with recent excellent response to treatment, we will continue treatment indefinitely

## 2019-01-11 NOTE — Progress Notes (Signed)
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[facility]) Her tumor marker is improved Unfortunately, due to uncontrolled hypertension and proteinuria, I am not able to start her on bevacizumab I reviewed the plan of care with the patient and her children Even though the genetic testing from the outside institution was incomplete, she would qualify for PARP inhibitor in the future if she had recurrence of disease However, given excellent response to treatment, I think maintenance bevacizumab at this point would be a good idea I recommend aggressive blood pressure management over the next 2 weeks and we will reschedule her treatment today.  Pancytopenia, acquired ([place]) Her blood counts are improving She does not need transfusion support today I suspect she will continue to improve in time  Essential hypertension She has poorly controlled hypertension due to bevacizumab and other factors I recommend addition of beta-blocker I recommend advanced home care service to check on her blood pressure weekly We will call her next week to see how she does with the additional blood pressure medicine  Physical debility She is weak I recommend referral to outpatient physical therapy/cancer rehab for strengthening exercise and she agreed  Goals of care, counseling/discussion We have some discussion about goals of care would be to maximize function and supportive care while she continues on maintenance treatment With recurrence of disease, typically this is non-curative but with recent excellent response to treatment, we will continue treatment indefinitely   Orders Placed This Encounter  Procedures  . Ambulatory referral to Physical Therapy    Referral Priority:   Routine    Referral Type:   Physical Medicine     Referral Reason:   Specialty Services Required    Requested Specialty:   Physical Therapy    Number of Visits Requested:   1  . Ambulatory referral to Home Health    Referral Priority:   Routine    Referral Type:   Home Health Care    Referral Reason:   Specialty Services Required    Requested Specialty:   [place]    Number of Visits Requested:   1    INTERVAL HISTORY: Please see below for problem oriented charting. She returns with her daughter and son for further follow-up and review of test results She felt better after blood transfusion and then got weak again She has no recent falls Appetite is stable Her pain is well controlled right now with scheduled pain medicine She has intermittent nausea She felt weak. Denies abdominal bloating She has mild dyspnea on exertion Her BP monitoring at home is high  SUMMARY OF ONCOLOGIC HISTORY: Oncology History   Neg genetics. No HRD done on cell block     Fallopian tube cancer, carcinoma ([place])   06/18/2016 Tumor Marker    Patient's tumor was tested for the following markers: CA-125 Results of the tumor marker test revealed 298.    06/23/2016 PET scan    Diffuse hypermetabolic peritoneal carcinomatosis in the abdomen and pelvis, internal mammary adenopathy in the chest, hypermetabolic right paracardiac/diaphragmatic LN, bilateral pleural effusion and moderate ascites    06/24/2016 Procedure    Therapeutic paracentesis    06/24/2016 Pathology Results    Positive for adenocarcinoma, Mullerian primary    06/28/2016 Procedure    She underwent CT guided biopsy of omentum  06/28/2016 Pathology Results    High grade serous involving the omentum, positive for p53, PAX8, WT1 and p16.    07/01/2016 Procedure    Findings/impression:   1. Sonographic evaluation of the right internal jugular vein confirms patency. Sonography was required to gain central venous access. An image documenting patency and needle access was  saved.   2. Successful placement of right internal jugular vein subcutaneous Mediport as described. Spot film shows the catheter tip at the cavoatrial junction. No pneumothorax. The Mediport aspirates and flushes normally.    07/02/2016 - 08/12/2016 Chemotherapy    The patient had 3 cycles of carboplatin and taxol chemotherapy treatment.      08/29/2016 Imaging    Ct scan of chest, abdomen and pelvis showed marked improvement and resolution of thoracic adenopathy    09/08/2016 Surgery    She underwent interval debulking surgery with TAH/BSO and infracolic omentectomy by Dr. Wynelle Cleveland    09/08/2016 Pathology Results    Right tube: scant serous carcinoma. No viable tumor. Left tube and ovary: normal. Omentum: low volume serous carcinoma with few psamomma bodies.    10/07/2016 - 11/18/2016 Chemotherapy    The patient had 3 more cycles of carboplatin and taxol for chemotherapy treatment.      02/07/2017 Imaging    Impression:  1. Colonic diverticulosis without acute diverticulitis. 2. Stable calcification right kidney without hydronephrosis. 3. No acute process or significant interval change has occurred since August 29, 2016.    05/09/2018 Imaging    Impression:  Findings are most suspicious for acute sigmoid colonic diverticulitis with intraloop abscess formation.    05/14/2018 Imaging    Impression: The pelvic fluid collection contiguous with sigmoid colonic diverticulitis changes has not significant change since May 09, 2018. No new fluid collections are seen    06/28/2018 Imaging    IMPRESSION: Changes consistent with diverticulitis with free fluid within the pelvis. No focal contained abscess is seen. A previously noted fluid collection deep within the pelvis has resolved in the interval.   Stable nonobstructing right renal stone.  The remainder of the exam is stable from the prior study.     07/11/2018 PET scan    Small volume ascites, new stranding and omental  nodularity    07/13/2018 Imaging    Findings are consistent with peritoneal carcinomatosis given the history of ovarian carcinoma. There is omental caking as well as stranding within various areas of the peritoneal fat. There is also wall thickening of the sigmoid colon with adjacent stranding. This finding can also be related to peritoneal carcinomatosis and implants although an inflammatory process of the sigmoid colon is not excluded.   Right nephrolithiasis.  Ileus pattern.    07/16/2018 Imaging    Impression:   Findings of peritoneal carcinomatosis, rapidly progressive. Not present in July 2019, progressed when compared to September 13.  Multiple bowel loops are distorted but no distention or transition zone to suggest a component of active obstruction.    07/18/2018 Procedure    Findings/impression:  1. Sonographic evaluation of ascites. Sonography was required to gain access to ascites. An image documenting ascites was saved. 2. Successful ultrasound guided paracentesis using a temporary peritoneal drainage catheter inserted in the right upper quadrant as described yielding 600cc of ascites.     07/20/2018 Echocardiogram    General:  The patient was in normal sinus rhythm.  Left Ventricle:  Left ventricular ejection fraction was normal, estimated in the range of 60 to 65%. The left ventricular cavity size  appears normal. The left ventricular wall thickness appears normal. The left ventricle is thickened in a fashion  consistent with mild concentric hypertrophy. Doppler tissue velocities and mitral inflow profile are consistent with Stage I diastolic dysfunction. The calculated ejection fraction is 68.1 %.  Right Ventricle:  The right ventricle appears normal in size and function.  Left Atrium:  The left atrium appears normal.  Right Atrium:  The right atrium appears normal.  Aortic Valve:  The structure of the aortic valve is tricuspid. There is evidence of trivial (trace)  aortic regurgitation.  Mitral Valve:  There is no evidence of mitral regurgitation. The mitral valve appears normal in structure.  Pulmonic Valve:  There is evidence of trace (trivial) pulmonic regurgitation. The pulmonic valve appears normal in structure.  Tricuspid Valve:  There is evidence of trace (trivial) tricuspid regurgitation. The tricuspid valve appears normal in structure.  Pericardium:  There is no evidence of pericardial effusion.  Aorta:  The visualized portions of the aorta (ascending aorta, aortic root, and aortic arch) appear normal.  Pulmonic Artery:  Unable to obtain RVSP due to insufficient tricuspid regurgitation jet.  Conclusions: Left ventricular ejection fraction was normal, estimated in the range of 60 to 65%. The left ventricular cavity size appears normal. The left ventricular wall thickness appears normal. The left ventricle is thickened in a fashion consistent with mild concentric hypertrophy. Doppler tissue velocities and mitral inflow profile are consistent with Stage I diastolic dysfunction. The calculated ejection fraction is 68.1%.There is evidence of trivial (trace) aortic regurgitation.There is evidence of trace (trivial) pulmonic regurgitation.There is evidence of trace (trivial) tricuspid regurgitation.There is no evidence of pericardial effusion.Unable to obtain RVSP due to insufficient tricuspid regurgitation jet.    07/23/2018 - 12/25/2018 Chemotherapy    The patient had carboplatin, doxil and Avastin for chemotherapy treatment x 6 cycles    08/01/2018 Imaging    Ct head showed no injuries.    08/01/2018 Procedure    Findings/impression:  1. Sonographic evaluation of ascites. Sonography was required to gain access to ascites. An image documenting ascites was saved. 2. Successful ultrasound guided paracentesis using a temporary peritoneal drainage catheter inserted in the right upper quadrant as described yielding 400cc of ascites.     08/06/2018 Tumor  Marker    Patient's tumor was tested for the following markers: CA-125 Results of the tumor marker test revealed 373    08/20/2018 Tumor Marker    Patient's tumor was tested for the following markers: CA-125 Results of the tumor marker test revealed 51.    10/03/2018 Genetic Testing    Patient has genetic testing done for genetics testing using her saliva. 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  Liver and spleen homogeneously enhance. No lesion seen.  The adjacent gallbladder is surgically absent.  The adrenal glands, kidneys, pancreas are normal.  Pelvic contents are remarkable for an absent uterus.  The mesenteric stranding has nearly completely resolved. Minimal reticular prominence throughout the pelvis.  No residual ascites.  No bowel wall thickening or. No dilated loops or transition zone.  The appendix is not seen. But no inflammatory changes in the right lower quadrant.  Osseous structures are intact.    11/27/2018 Imaging    Ct scan abdomen and pelvis showed colonic diverticulosis and nonspecific fluid levels in the bowel    12/31/2018 Tumor Marker    Patient's tumor  was tested for the following markers: CA-125 Results of the tumor marker test revealed 14.4    01/01/2019 Imaging    1. LEFT jugular Port-A-Cath catheter tip projects over the mid SVC. 2. No acute cardiopulmonary disease.     01/04/2019 Echocardiogram    1. The left ventricle has normal systolic function with an ejection fraction of 60-65%. The cavity size was normal. Left ventricular diastolic Doppler parameters are consistent with impaired relaxation.  2. The right ventricle has normal systolic function. The cavity was normal. There is no increase in right ventricular wall thickness.  3. The mitral valve is normal in structure.  4. The tricuspid valve is normal in structure.  5. The aortic valve  is normal in structure.  6. The pulmonic valve was normal in structure.  7. 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      REVIEW OF SYSTEMS:   Constitutional: Denies fevers, chills or abnormal weight loss Eyes: Denies blurriness of vision Ears, nose, mouth, throat, and face: Denies mucositis or sore throat Cardiovascular: Denies palpitation, chest discomfort or lower extremity swelling Skin: Denies abnormal skin rashes Lymphatics: Denies new lymphadenopathy or easy bruising Behavioral/Psych: Mood is stable, no new changes  All other systems were reviewed with the patient and are negative.  I have reviewed the past medical history, past surgical history, social history and family history with the patient and they are unchanged from previous note.  ALLERGIES:  is allergic to latex.  MEDICATIONS:  Current Outpatient Medications  Medication Sig Dispense Refill  . Acetaminophen 325 MG CAPS Take 650 mg by mouth every 6 (six) hours as needed.    . ALPRAZolam (XANAX) 0.25 MG tablet Take 0.25 mg by mouth at bedtime as needed for anxiety.    Marland Kitchen amLODipine (NORVASC) 10 MG tablet Take 10 mg by mouth daily.    . Ascorbic Acid (VITAMIN C PO) Take 500 mg by mouth daily.     Marland Kitchen atenolol (TENORMIN) 25 MG tablet Take 1 tablet (25 mg total) by mouth daily. 30 tablet 11  . bevacizumab-awwb in sodium chloride 0.9 % 100 mL Inject into the vein once.    . Cholecalciferol (VITAMIN D PO) Take 1 tablet by mouth daily.    Marland Kitchen escitalopram (LEXAPRO) 10 MG tablet Take 1 tablet (10 mg total) by mouth daily. 90 tablet 1  . L-Methylfolate-Algae-B12-B6 (METANX PO) Take 1 tablet by mouth 2 (two) times daily.    Marland Kitchen losartan (COZAAR) 100 MG tablet Take 100 mg by mouth daily.    . Multiple Vitamin (MULTIVITAMIN) tablet Take 1 tablet by mouth daily.    . ondansetron (ZOFRAN) 8 MG tablet Take 8 mg by mouth every 8 (eight) hours as needed for nausea or vomiting.    Marland Kitchen oxyCODONE-acetaminophen  (PERCOCET/ROXICET) 5-325 MG tablet Take 1 tablet by mouth every 4 (four) hours as needed for severe pain. 20 tablet 0  . rosuvastatin (CRESTOR) 10 MG tablet Take 10 mg by mouth at bedtime.      Current Facility-Administered Medications  Medication Dose Route Frequency Provider Last Rate Last Dose  . heparin lock flush 100 unit/mL  500 Units Intravenous Once Ahsley Attwood, MD      . sodium chloride flush (NS) 0.9 % injection 10 mL  10 mL Intravenous Once Alvy Bimler, Jaryiah Mehlman, MD        PHYSICAL EXAMINATION: ECOG PERFORMANCE STATUS: 2 - Symptomatic, <50% confined to bed  Vitals:   01/10/19 1334  BP: (!) 180/82  Pulse:  86  Resp: 18  Temp: 97.7 F (36.5 C)  SpO2: 95%   Filed Weights   01/10/19 1334  Weight: 149 lb 12.8 oz (67.9 kg)    GENERAL:alert, no distress and comfortable SKIN: skin color, texture, turgor are normal, no rashes or significant lesions EYES: normal, Conjunctiva are pink and non-injected, sclera clear OROPHARYNX:no exudate, no erythema and lips, buccal mucosa, and tongue normal  NECK: supple, thyroid normal size, non-tender, without nodularity LYMPH:  no palpable lymphadenopathy in the cervical, axillary or inguinal LUNGS: clear to auscultation and percussion with normal breathing effort HEART: regular rate & rhythm and no murmurs with mild lower extremity edema ABDOMEN:abdomen soft, non-tender and normal bowel sounds Musculoskeletal:no cyanosis of digits and no clubbing  NEURO: alert & oriented x 3 with fluent speech, no focal motor/sensory deficits  LABORATORY DATA:  I have reviewed the data as listed    Component Value Date/Time   NA 138 01/10/2019 1215   NA 139 06/27/2018 1649   K 4.1 01/10/2019 1215   CL 103 01/10/2019 1215   CO2 24 01/10/2019 1215   GLUCOSE 134 (H) 01/10/2019 1215   BUN 18 01/10/2019 1215   BUN 17 06/27/2018 1649   CREATININE 1.10 (H) 01/10/2019 1215   CALCIUM 9.0 01/10/2019 1215   PROT 6.6 01/10/2019 1215   PROT 6.8 05/24/2018 0851    ALBUMIN 3.5 01/10/2019 1215   ALBUMIN 4.2 05/24/2018 0851   AST 28 01/10/2019 1215   ALT 15 01/10/2019 1215   ALKPHOS 76 01/10/2019 1215   BILITOT 0.8 01/10/2019 1215   BILITOT 0.2 05/24/2018 0851   GFRNONAA 47 (L) 01/10/2019 1215   GFRAA 55 (L) 01/10/2019 1215    No results found for: SPEP, UPEP  Lab Results  Component Value Date   WBC 3.5 (L) 01/10/2019   NEUTROABS 0.4 (LL) 01/10/2019   HGB 9.0 (L) 01/10/2019   HCT 27.7 (L) 01/10/2019   MCV 104.9 (H) 01/10/2019   PLT 47 (L) 01/10/2019      Chemistry      Component Value Date/Time   NA 138 01/10/2019 1215   NA 139 06/27/2018 1649   K 4.1 01/10/2019 1215   CL 103 01/10/2019 1215   CO2 24 01/10/2019 1215   BUN 18 01/10/2019 1215   BUN 17 06/27/2018 1649   CREATININE 1.10 (H) 01/10/2019 1215      Component Value Date/Time   CALCIUM 9.0 01/10/2019 1215   ALKPHOS 76 01/10/2019 1215   AST 28 01/10/2019 1215   ALT 15 01/10/2019 1215   BILITOT 0.8 01/10/2019 1215   BILITOT 0.2 05/24/2018 0851       RADIOGRAPHIC STUDIES: I have personally reviewed the radiological images as listed and agreed with the findings in the report. Dg Chest 1 View  Result Date: 01/01/2019 CLINICAL DATA:  Evaluate Port-A-Cath position. Current history of carcinoma of the fallopian tube. EXAM: CHEST  1 VIEW COMPARISON:  None. FINDINGS: LEFT jugular Port-A-Cath catheter tip projects over the mid SVC. Cardiac silhouette normal in size. Thoracic aorta mildly tortuous. Hilar and mediastinal contours otherwise unremarkable. Lungs clear. Bronchovascular markings normal. Pulmonary vascularity normal. No visible pleural effusions. No pneumothorax. IMPRESSION: 1. LEFT jugular Port-A-Cath catheter tip projects over the mid SVC. 2. No acute cardiopulmonary disease. Electronically Signed   By: Evangeline Dakin M.D.   On: 01/01/2019 08:21    All questions were answered. The patient knows to call the clinic with any problems, questions or concerns. No barriers to  learning was detected.  I spent 40 minutes counseling the patient face to face. The total time spent in the appointment was 55 minutes and more than 50% was on counseling and review of test results  Heath Lark, MD 01/11/2019 9:56 AM

## 2019-01-11 NOTE — Assessment & Plan Note (Addendum)
Her blood counts are improving She does not need transfusion support today I suspect she will continue to improve in time

## 2019-01-15 ENCOUNTER — Telehealth: Payer: Self-pay | Admitting: Oncology

## 2019-01-15 NOTE — Telephone Encounter (Signed)
Called Lisa Moyer back and advised her of message from Dr. Alvy Bimler.  She verbalized agreement.

## 2019-01-15 NOTE — Telephone Encounter (Signed)
I trust the home health has similar guidelines in terms of employee health. They will not come to her home if they are sick. The whole point of home health and PT is to deliver service to her at home so she does not need to come here

## 2019-01-15 NOTE — Telephone Encounter (Signed)
Lisa Moyer called and said [facility] contacted her and said they may have a specimen from 2019 that is showing promise to have enough genetic material.  She has asked that they fax or email any results to Dr. Alvy Bimler.  She asked if it is safe for her mother to have home health nurses and PT visit twice a week because of risk of exposure to COVID-19.

## 2019-01-17 ENCOUNTER — Telehealth: Payer: Self-pay

## 2019-01-17 NOTE — Telephone Encounter (Signed)
Called and left below message. Ask her to call the office back. 

## 2019-01-17 NOTE — Telephone Encounter (Signed)
-----   Message from Heath Lark, MD sent at 01/17/2019  7:54 AM EDT ----- Regarding: BP How is her BP control at home?

## 2019-01-21 NOTE — Telephone Encounter (Signed)
Called and given below message. She did not understand that she should call the office back. Clarified upcoming appts. She verbalized understanding.  She will bring a copy of blood pressure reading on appt 3/26.  Blood pressure today am- 140/72 and after meds 157/74 3/22 am bp- 167/73 and with meds 154/64 3/21 am bp-159/70 and after meds 139/61, evening bp-182/81 and with meds 157/71 3/20 am bp-151/70 and after meds 158/67, afternoon bp 150/70  [facility] nurse Beth called and left a message. She saw the patient in the home today. Blood pressure is running 312'O to 118'A systolic.

## 2019-01-22 ENCOUNTER — Telehealth: Payer: Self-pay | Admitting: Oncology

## 2019-01-22 ENCOUNTER — Other Ambulatory Visit: Payer: Self-pay

## 2019-01-22 MED ORDER — HYDROCHLOROTHIAZIDE 12.5 MG PO TABS: 12.5000 mg | ORAL_TABLET | Freq: Every day | ORAL | 3 refills | Status: DC

## 2019-01-22 NOTE — Telephone Encounter (Signed)
Below msg given to pt's son, [name].  Prescription sent to pharmacy.  He repeated instructions and verbalized understanding.

## 2019-01-22 NOTE — Telephone Encounter (Signed)
Please call back I believe when I started her on Atenolol I told her to start at 1/2 tablet She can increase to 25 mg daily, along with amlodipine and Losartan In addition, call in HCTZ 12.5 mg daily to be taken in the morning

## 2019-01-22 NOTE — Telephone Encounter (Signed)
[name] called and will be [employer] [employer] results.

## 2019-01-24 ENCOUNTER — Telehealth: Payer: Self-pay | Admitting: Hematology and Oncology

## 2019-01-24 ENCOUNTER — Encounter: Payer: Self-pay | Admitting: Hematology and Oncology

## 2019-01-24 ENCOUNTER — Inpatient Hospital Stay: Payer: Medicare Other

## 2019-01-24 ENCOUNTER — Other Ambulatory Visit: Payer: Self-pay

## 2019-01-24 ENCOUNTER — Inpatient Hospital Stay (HOSPITAL_BASED_OUTPATIENT_CLINIC_OR_DEPARTMENT_OTHER): Payer: Medicare Other | Admitting: Hematology and Oncology

## 2019-01-24 VITALS — BP 192/95 | HR 66 | Temp 97.8°F | Resp 18 | Ht 62.0 in | Wt 143.4 lb

## 2019-01-24 DIAGNOSIS — C5701 Malignant neoplasm of right fallopian tube: Secondary | ICD-10-CM

## 2019-01-24 DIAGNOSIS — I10 Essential (primary) hypertension: Secondary | ICD-10-CM | POA: Diagnosis not present

## 2019-01-24 DIAGNOSIS — C5700 Malignant neoplasm of unspecified fallopian tube: Secondary | ICD-10-CM

## 2019-01-24 DIAGNOSIS — D61818 Other pancytopenia: Secondary | ICD-10-CM | POA: Diagnosis not present

## 2019-01-24 DIAGNOSIS — C786 Secondary malignant neoplasm of retroperitoneum and peritoneum: Secondary | ICD-10-CM

## 2019-01-24 DIAGNOSIS — R5381 Other malaise: Secondary | ICD-10-CM

## 2019-01-24 DIAGNOSIS — T451X5A Adverse effect of antineoplastic and immunosuppressive drugs, initial encounter: Secondary | ICD-10-CM

## 2019-01-24 DIAGNOSIS — R809 Proteinuria, unspecified: Secondary | ICD-10-CM

## 2019-01-24 DIAGNOSIS — D539 Nutritional anemia, unspecified: Secondary | ICD-10-CM

## 2019-01-24 DIAGNOSIS — D6481 Anemia due to antineoplastic chemotherapy: Secondary | ICD-10-CM

## 2019-01-24 DIAGNOSIS — R531 Weakness: Secondary | ICD-10-CM

## 2019-01-24 LAB — CBC WITH DIFFERENTIAL/PLATELET
Abs Immature Granulocytes: 0.02 10*3/uL (ref 0.00–0.07)
BASOS ABS: 0.1 10*3/uL (ref 0.0–0.1)
Basophils Relative: 1 %
Eosinophils Absolute: 0.2 10*3/uL (ref 0.0–0.5)
Eosinophils Relative: 4 %
HCT: 32.5 % — ABNORMAL LOW (ref 36.0–46.0)
Hemoglobin: 10.6 g/dL — ABNORMAL LOW (ref 12.0–15.0)
Immature Granulocytes: 0 %
Lymphocytes Relative: 32 %
Lymphs Abs: 1.8 10*3/uL (ref 0.7–4.0)
MCH: 35.8 pg — ABNORMAL HIGH (ref 26.0–34.0)
MCHC: 32.6 g/dL (ref 30.0–36.0)
MCV: 109.8 fL — ABNORMAL HIGH (ref 80.0–100.0)
Monocytes Absolute: 0.5 10*3/uL (ref 0.1–1.0)
Monocytes Relative: 9 %
NRBC: 0 % (ref 0.0–0.2)
Neutro Abs: 3 10*3/uL (ref 1.7–7.7)
Neutrophils Relative %: 54 %
Platelets: 139 10*3/uL — ABNORMAL LOW (ref 150–400)
RBC: 2.96 MIL/uL — ABNORMAL LOW (ref 3.87–5.11)
RDW: 17.7 % — ABNORMAL HIGH (ref 11.5–15.5)
WBC: 5.6 10*3/uL (ref 4.0–10.5)

## 2019-01-24 LAB — COMPREHENSIVE METABOLIC PANEL
ALT: 14 U/L (ref 0–44)
AST: 22 U/L (ref 15–41)
Albumin: 3.8 g/dL (ref 3.5–5.0)
Alkaline Phosphatase: 74 U/L (ref 38–126)
Anion gap: 11 (ref 5–15)
BUN: 18 mg/dL (ref 8–23)
CO2: 27 mmol/L (ref 22–32)
Calcium: 9.3 mg/dL (ref 8.9–10.3)
Chloride: 103 mmol/L (ref 98–111)
Creatinine, Ser: 1.02 mg/dL — ABNORMAL HIGH (ref 0.44–1.00)
GFR calc non Af Amer: 51 mL/min — ABNORMAL LOW (ref >60)
GFR, EST AFRICAN AMERICAN: 59 mL/min — AB (ref >60)
Glucose, Bld: 97 mg/dL (ref 70–99)
Potassium: 4 mmol/L (ref 3.5–5.1)
Sodium: 141 mmol/L (ref 135–145)
Total Bilirubin: 0.5 mg/dL (ref 0.3–1.2)
Total Protein: 7.4 g/dL (ref 6.5–8.1)

## 2019-01-24 LAB — SAMPLE TO BLOOD BANK

## 2019-01-24 LAB — TOTAL PROTEIN, URINE DIPSTICK: Protein, ur: 100 mg/dL — AB

## 2019-01-24 MED ORDER — HYDRALAZINE HCL 50 MG PO TABS: 50.0000 mg | ORAL_TABLET | Freq: Three times a day (TID) | ORAL | 1 refills | Status: DC

## 2019-01-24 NOTE — Assessment & Plan Note (Signed)
She is getting stronger.  She has minimum pain I recommend taper for pain medicine to take only once at night if needed She will continue PT at home

## 2019-01-24 NOTE — Assessment & Plan Note (Signed)
She has severe, uncontrolled hypertension I will start her on hydralazine and will call her son on Monday for further follow-up and blood pressure medication adjustment as needed She is currently not symptomatic We discussed dietary change

## 2019-01-24 NOTE — Assessment & Plan Note (Signed)
The patient is recovering nicely from side effects of chemotherapy Unfortunately, she has poorly controlled hypertension I have counseled her treatment tomorrow We need aggressive blood pressure monitoring and management before she can resume bevacizumab I gave her son a copy of her HRD testing She will also qualify for PARP inhibitor as a form of maintenance treatment if she is not able to resume bevacizumab

## 2019-01-24 NOTE — Assessment & Plan Note (Signed)
Her pancytopenia is improving She is not symptomatic She does not need transfusion support

## 2019-01-24 NOTE — Progress Notes (Signed)
[facility] PROGRESS NOTE  Patient Care Team: Shawnee Knapp, MD as PCP - General (Family Medicine) Ileana Roup, MD as Consulting Physician (General Surgery)  ASSESSMENT & PLAN:  Fallopian tube cancer, carcinoma [facility] [place]) The patient is recovering nicely from side effects of chemotherapy Unfortunately, she has poorly controlled hypertension I have counseled her treatment tomorrow We need aggressive blood pressure monitoring and management before she can resume bevacizumab I gave her son a copy of her HRD testing She will also qualify for PARP inhibitor as a form of maintenance treatment if she is not able to resume bevacizumab  Essential hypertension She has severe, uncontrolled hypertension I will start her on hydralazine and will call her son on Monday for further follow-up and blood pressure medication adjustment as needed She is currently not symptomatic We discussed dietary change  Pancytopenia, acquired ([place]) Her pancytopenia is improving She is not symptomatic She does not need transfusion support  Physical debility She is getting stronger.  She has minimum pain I recommend taper for pain medicine to take only once at night if needed She will continue PT at home   No orders of the defined types were placed in this encounter.   INTERVAL HISTORY: Please see below for problem oriented charting. She returns with her son for further follow-up She felt better and less fatigue Denies cough, chest pain or shortness of breath No recent falls Denies leg swelling She has minimum pain The patient denies any recent signs or symptoms of bleeding such as spontaneous epistaxis, hematuria or hematochezia. She denies headache or dizziness with hypertension  SUMMARY OF ONCOLOGIC HISTORY: Oncology History   Neg genetics.  HRD test was done on peritoneal fluid from 07/18/2018: HRD +     Fallopian tube cancer, carcinoma ([place])   06/18/2016 Tumor Marker     Patient's tumor was tested for the following markers: CA-125 Results of the tumor marker test revealed 298.    06/23/2016 PET scan    Diffuse hypermetabolic peritoneal carcinomatosis in the abdomen and pelvis, internal mammary adenopathy in the chest, hypermetabolic right paracardiac/diaphragmatic LN, bilateral pleural effusion and moderate ascites    06/24/2016 Procedure    Therapeutic paracentesis    06/24/2016 Pathology Results    Positive for adenocarcinoma, Mullerian primary    06/28/2016 Procedure    She underwent CT guided biopsy of omentum    06/28/2016 Pathology Results    High grade serous involving the omentum, positive for p53, PAX8, WT1 and p16.    07/01/2016 Procedure    Findings/impression:   1. Sonographic evaluation of the right internal jugular vein confirms patency. Sonography was required to gain central venous access. An image documenting patency and needle access was saved.   2. Successful placement of right internal jugular vein subcutaneous Mediport as described. Spot film shows the catheter tip at the cavoatrial junction. No pneumothorax. The Mediport aspirates and flushes normally.    07/02/2016 - 08/12/2016 Chemotherapy    The patient had 3 cycles of carboplatin and taxol chemotherapy treatment.      08/29/2016 Imaging    Ct scan of chest, abdomen and pelvis showed marked improvement and resolution of thoracic adenopathy    09/08/2016 Surgery    She underwent interval debulking surgery with TAH/BSO and infracolic omentectomy by Dr. Wynelle Cleveland    09/08/2016 Pathology Results    Right tube: scant serous carcinoma. No viable tumor. Left tube and ovary: normal. Omentum: low volume serous carcinoma with few psamomma bodies.  10/07/2016 - 11/18/2016 Chemotherapy    The patient had 3 more cycles of carboplatin and taxol for chemotherapy treatment.      02/07/2017 Imaging    Impression:  1. Colonic diverticulosis without acute diverticulitis. 2. Stable  calcification right kidney without hydronephrosis. 3. No acute process or significant interval change has occurred since August 29, 2016.    05/09/2018 Imaging    Impression:  Findings are most suspicious for acute sigmoid colonic diverticulitis with intraloop abscess formation.    05/14/2018 Imaging    Impression: The pelvic fluid collection contiguous with sigmoid colonic diverticulitis changes has not significant change since May 09, 2018. No new fluid collections are seen    06/28/2018 Imaging    IMPRESSION: Changes consistent with diverticulitis with free fluid within the pelvis. No focal contained abscess is seen. A previously noted fluid collection deep within the pelvis has resolved in the interval.   Stable nonobstructing right renal stone.  The remainder of the exam is stable from the prior study.     07/11/2018 PET scan    Small volume ascites, new stranding and omental nodularity    07/13/2018 Imaging    Findings are consistent with peritoneal carcinomatosis given the history of ovarian carcinoma. There is omental caking as well as stranding within various areas of the peritoneal fat. There is also wall thickening of the sigmoid colon with adjacent stranding. This finding can also be related to peritoneal carcinomatosis and implants although an inflammatory process of the sigmoid colon is not excluded.   Right nephrolithiasis.  Ileus pattern.    07/16/2018 Imaging    Impression:   Findings of peritoneal carcinomatosis, rapidly progressive. Not present in July 2019, progressed when compared to September 13.  Multiple bowel loops are distorted but no distention or transition zone to suggest a component of active obstruction.    07/18/2018 Procedure    Findings/impression:  1. Sonographic evaluation of ascites. Sonography was required to gain access to ascites. An image documenting ascites was saved. 2. Successful ultrasound guided paracentesis using a temporary  peritoneal drainage catheter inserted in the right upper quadrant as described yielding 600cc of ascites.     07/20/2018 Echocardiogram    General:  The patient was in normal sinus rhythm.  Left Ventricle:  Left ventricular ejection fraction was normal, estimated in the range of 60 to 65%. The left ventricular cavity size appears normal. The left ventricular wall thickness appears normal. The left ventricle is thickened in a fashion  consistent with mild concentric hypertrophy. Doppler tissue velocities and mitral inflow profile are consistent with Stage I diastolic dysfunction. The calculated ejection fraction is 68.1 %.  Right Ventricle:  The right ventricle appears normal in size and function.  Left Atrium:  The left atrium appears normal.  Right Atrium:  The right atrium appears normal.  Aortic Valve:  The structure of the aortic valve is tricuspid. There is evidence of trivial (trace) aortic regurgitation.  Mitral Valve:  There is no evidence of mitral regurgitation. The mitral valve appears normal in structure.  Pulmonic Valve:  There is evidence of trace (trivial) pulmonic regurgitation. The pulmonic valve appears normal in structure.  Tricuspid Valve:  There is evidence of trace (trivial) tricuspid regurgitation. The tricuspid valve appears normal in structure.  Pericardium:  There is no evidence of pericardial effusion.  Aorta:  The visualized portions of the aorta (ascending aorta, aortic root, and aortic arch) appear normal.  Pulmonic Artery:  Unable to obtain RVSP due to insufficient tricuspid  regurgitation jet.  Conclusions: Left ventricular ejection fraction was normal, estimated in the range of 60 to 65%. The left ventricular cavity size appears normal. The left ventricular wall thickness appears normal. The left ventricle is thickened in a fashion consistent with mild concentric hypertrophy. Doppler tissue velocities and mitral inflow profile are consistent with Stage I  diastolic dysfunction. The calculated ejection fraction is 68.1%.There is evidence of trivial (trace) aortic regurgitation.There is evidence of trace (trivial) pulmonic regurgitation.There is evidence of trace (trivial) tricuspid regurgitation.There is no evidence of pericardial effusion.Unable to obtain RVSP due to insufficient tricuspid regurgitation jet.    07/23/2018 - 12/25/2018 Chemotherapy    The patient had carboplatin, doxil and Avastin for chemotherapy treatment x 6 cycles    08/01/2018 Imaging    Ct head showed no injuries.    08/01/2018 Procedure    Findings/impression:  1. Sonographic evaluation of ascites. Sonography was required to gain access to ascites. An image documenting ascites was saved. 2. Successful ultrasound guided paracentesis using a temporary peritoneal drainage catheter inserted in the right upper quadrant as described yielding 400cc of ascites.     08/06/2018 Tumor Marker    Patient's tumor was tested for the following markers: CA-125 Results of the tumor marker test revealed 373    08/20/2018 Tumor Marker    Patient's tumor was tested for the following markers: CA-125 Results of the tumor marker test revealed 51.    10/03/2018 Genetic Testing    Patient has genetic testing done for genetics testing using her saliva. 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  Liver and spleen homogeneously enhance. No lesion seen.  The adjacent gallbladder is surgically absent.  The adrenal glands, kidneys, pancreas are normal.  Pelvic contents are remarkable for an absent uterus.  The mesenteric stranding has nearly completely resolved. Minimal reticular prominence throughout the pelvis.  No residual ascites.  No bowel wall thickening or. No dilated loops or transition zone.  The appendix is not seen. But no inflammatory changes in the right lower  quadrant.  Osseous structures are intact.    11/27/2018 Imaging    Ct scan abdomen and pelvis showed colonic diverticulosis and nonspecific fluid levels in the bowel    12/31/2018 Tumor Marker    Patient's tumor was tested for the following markers: CA-125 Results of the tumor marker test revealed 14.4    01/01/2019 Imaging    1. LEFT jugular Port-A-Cath catheter tip projects over the mid SVC. 2. No acute cardiopulmonary disease.     01/04/2019 Echocardiogram    1. The left ventricle has normal systolic function with an ejection fraction of 60-65%. The cavity size was normal. Left ventricular diastolic Doppler parameters are consistent with impaired relaxation.  2. The right ventricle has normal systolic function. The cavity was normal. There is no increase in right ventricular wall thickness.  3. The mitral valve is normal in structure.  4. The tricuspid valve is normal in structure.  5. The aortic valve is normal in structure.  6. The pulmonic valve was normal in structure.  7. 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      REVIEW OF SYSTEMS:   Constitutional: Denies fevers, chills or abnormal weight loss Eyes: Denies blurriness of vision Ears, nose, mouth, throat, and face: Denies mucositis or sore throat Respiratory: Denies cough, dyspnea or wheezes Cardiovascular: Denies palpitation, chest  discomfort or lower extremity swelling Gastrointestinal:  Denies nausea, heartburn or change in bowel habits Skin: Denies abnormal skin rashes Lymphatics: Denies new lymphadenopathy or easy bruising Neurological:Denies numbness, tingling or new weaknesses Behavioral/Psych: Mood is stable, no new changes  All other systems were reviewed with the patient and are negative.  I have reviewed the past medical history, past surgical history, social history and family history with the patient and they are unchanged from previous note.  ALLERGIES:  is allergic to  latex.  MEDICATIONS:  Current Outpatient Medications  Medication Sig Dispense Refill  . Acetaminophen 325 MG CAPS Take 650 mg by mouth every 6 (six) hours as needed.    . ALPRAZolam (XANAX) 0.25 MG tablet Take 0.25 mg by mouth at bedtime as needed for anxiety.    Marland Kitchen amLODipine (NORVASC) 10 MG tablet Take 10 mg by mouth daily.    . Ascorbic Acid (VITAMIN C PO) Take 500 mg by mouth daily.     Marland Kitchen atenolol (TENORMIN) 25 MG tablet Take 1 tablet (25 mg total) by mouth daily. 30 tablet 11  . bevacizumab-awwb in sodium chloride 0.9 % 100 mL Inject into the vein once.    . Cholecalciferol (VITAMIN D PO) Take 1 tablet by mouth daily.    Marland Kitchen escitalopram (LEXAPRO) 10 MG tablet Take 1 tablet (10 mg total) by mouth daily. 90 tablet 1  . hydrALAZINE (APRESOLINE) 50 MG tablet Take 1 tablet (50 mg total) by mouth 3 (three) times daily. 90 tablet 1  . hydrochlorothiazide (HYDRODIURIL) 12.5 MG tablet Take 1 tablet (12.5 mg total) by mouth daily. Take in the morning. 30 tablet 3  . L-Methylfolate-Algae-B12-B6 (METANX PO) Take 1 tablet by mouth 2 (two) times daily.    Marland Kitchen losartan (COZAAR) 100 MG tablet Take 100 mg by mouth daily.    . Multiple Vitamin (MULTIVITAMIN) tablet Take 1 tablet by mouth daily.    . ondansetron (ZOFRAN) 8 MG tablet Take 8 mg by mouth every 8 (eight) hours as needed for nausea or vomiting.    Marland Kitchen oxyCODONE-acetaminophen (PERCOCET/ROXICET) 5-325 MG tablet Take 1 tablet by mouth every 4 (four) hours as needed for severe pain. 20 tablet 0  . rosuvastatin (CRESTOR) 10 MG tablet Take 10 mg by mouth at bedtime.      No current facility-administered medications for this visit.     PHYSICAL EXAMINATION: ECOG PERFORMANCE STATUS: 1 - Symptomatic but completely ambulatory  Vitals:   01/24/19 1340  BP: (!) 192/95  Pulse: 66  Resp: 18  Temp: 97.8 F (36.6 C)  SpO2: 98%   Filed Weights   01/24/19 1340  Weight: 143 lb 6.4 oz (65 kg)    GENERAL:alert, no distress and  comfortable Musculoskeletal:no cyanosis of digits and no clubbing  NEURO: alert & oriented x 3 with fluent speech, no focal motor/sensory deficits  LABORATORY DATA:  I have reviewed the data as listed    Component Value Date/Time   NA 141 01/24/2019 1302   NA 139 06/27/2018 1649   K 4.0 01/24/2019 1302   CL 103 01/24/2019 1302   CO2 27 01/24/2019 1302   GLUCOSE 97 01/24/2019 1302   BUN 18 01/24/2019 1302   BUN 17 06/27/2018 1649   CREATININE 1.02 (H) 01/24/2019 1302   CALCIUM 9.3 01/24/2019 1302   PROT 7.4 01/24/2019 1302   PROT 6.8 05/24/2018 0851   ALBUMIN 3.8 01/24/2019 1302   ALBUMIN 4.2 05/24/2018 0851   AST 22 01/24/2019 1302   ALT 14 01/24/2019 1302  ALKPHOS 74 01/24/2019 1302   BILITOT 0.5 01/24/2019 1302   BILITOT 0.2 05/24/2018 0851   GFRNONAA 51 (L) 01/24/2019 1302   GFRAA 59 (L) 01/24/2019 1302    No results found for: SPEP, UPEP  Lab Results  Component Value Date   WBC 5.6 01/24/2019   NEUTROABS 3.0 01/24/2019   HGB 10.6 (L) 01/24/2019   HCT 32.5 (L) 01/24/2019   MCV 109.8 (H) 01/24/2019   PLT 139 (L) 01/24/2019      Chemistry      Component Value Date/Time   NA 141 01/24/2019 1302   NA 139 06/27/2018 1649   K 4.0 01/24/2019 1302   CL 103 01/24/2019 1302   CO2 27 01/24/2019 1302   BUN 18 01/24/2019 1302   BUN 17 06/27/2018 1649   CREATININE 1.02 (H) 01/24/2019 1302      Component Value Date/Time   CALCIUM 9.3 01/24/2019 1302   ALKPHOS 74 01/24/2019 1302   AST 22 01/24/2019 1302   ALT 14 01/24/2019 1302   BILITOT 0.5 01/24/2019 1302   BILITOT 0.2 05/24/2018 0851       RADIOGRAPHIC STUDIES: I have personally reviewed the radiological images as listed and agreed with the findings in the report. Dg Chest 1 View  Result Date: 01/01/2019 CLINICAL DATA:  Evaluate Port-A-Cath position. Current history of carcinoma of the fallopian tube. EXAM: CHEST  1 VIEW COMPARISON:  None. FINDINGS: LEFT jugular Port-A-Cath catheter tip projects over the  mid SVC. Cardiac silhouette normal in size. Thoracic aorta mildly tortuous. Hilar and mediastinal contours otherwise unremarkable. Lungs clear. Bronchovascular markings normal. Pulmonary vascularity normal. No visible pleural effusions. No pneumothorax. IMPRESSION: 1. LEFT jugular Port-A-Cath catheter tip projects over the mid SVC. 2. No acute cardiopulmonary disease. Electronically Signed   By: Evangeline Dakin M.D.   On: 01/01/2019 08:21    All questions were answered. The patient knows to call the clinic with any problems, questions or concerns. No barriers to learning was detected.  I spent 25 minutes counseling the patient face to face. The total time spent in the appointment was 30 minutes and more than 50% was on counseling and review of test results  Heath Lark, MD 01/24/2019 2:23 PM

## 2019-01-24 NOTE — Telephone Encounter (Signed)
Gave avs and calendar ° °

## 2019-01-25 ENCOUNTER — Inpatient Hospital Stay: Payer: Medicare Other

## 2019-01-28 ENCOUNTER — Encounter: Payer: Self-pay | Admitting: Hematology and Oncology

## 2019-01-31 ENCOUNTER — Telehealth: Payer: Self-pay

## 2019-01-31 NOTE — Telephone Encounter (Signed)
-----   Message from Heath Lark, MD sent at 01/31/2019 10:48 AM EDT ----- Regarding: call son [name] Can you call him for updates on her BP?

## 2019-01-31 NOTE — Telephone Encounter (Signed)
Called and left below message. Ask him to call the office back or send bp results thru mychart.

## 2019-02-04 ENCOUNTER — Encounter: Payer: Self-pay | Admitting: Hematology and Oncology

## 2019-02-04 ENCOUNTER — Telehealth: Payer: Self-pay | Admitting: *Deleted

## 2019-02-04 NOTE — Telephone Encounter (Signed)
-----   Message from Heath Lark, MD sent at 02/04/2019 12:36 PM EDT ----- Regarding: updated BP Pls call son [name]. How is her BP doing?

## 2019-02-04 NOTE — Telephone Encounter (Signed)
Telephone call to Lisa Moyer- he is going to send a [employer] with a list of her recent readings. He states the readings have been lower- they fluctuate a lot but much lower than previous readings

## 2019-02-05 ENCOUNTER — Other Ambulatory Visit: Payer: Self-pay | Admitting: Hematology and Oncology

## 2019-02-05 MED ORDER — HYDROCHLOROTHIAZIDE 12.5 MG PO TABS: 25.0000 mg | ORAL_TABLET | Freq: Every day | ORAL | 3 refills | Status: DC

## 2019-02-05 NOTE — Telephone Encounter (Signed)
I will reply to message directly

## 2019-02-06 ENCOUNTER — Encounter: Payer: Self-pay | Admitting: Hematology and Oncology

## 2019-02-07 ENCOUNTER — Inpatient Hospital Stay: Payer: Medicare Other | Attending: Hematology and Oncology

## 2019-02-07 ENCOUNTER — Telehealth: Payer: Self-pay | Admitting: Pharmacist

## 2019-02-07 ENCOUNTER — Other Ambulatory Visit: Payer: Self-pay | Admitting: Hematology and Oncology

## 2019-02-07 ENCOUNTER — Inpatient Hospital Stay (HOSPITAL_BASED_OUTPATIENT_CLINIC_OR_DEPARTMENT_OTHER): Payer: Medicare Other | Admitting: Hematology and Oncology

## 2019-02-07 ENCOUNTER — Inpatient Hospital Stay: Payer: Medicare Other

## 2019-02-07 ENCOUNTER — Other Ambulatory Visit: Payer: Self-pay

## 2019-02-07 ENCOUNTER — Encounter: Payer: Self-pay | Admitting: Hematology and Oncology

## 2019-02-07 VITALS — BP 161/72 | HR 64 | Temp 98.1°F | Resp 16 | Ht 62.0 in | Wt 144.2 lb

## 2019-02-07 DIAGNOSIS — I10 Essential (primary) hypertension: Secondary | ICD-10-CM

## 2019-02-07 DIAGNOSIS — C786 Secondary malignant neoplasm of retroperitoneum and peritoneum: Secondary | ICD-10-CM | POA: Insufficient documentation

## 2019-02-07 DIAGNOSIS — C5700 Malignant neoplasm of unspecified fallopian tube: Secondary | ICD-10-CM

## 2019-02-07 DIAGNOSIS — I129 Hypertensive chronic kidney disease with stage 1 through stage 4 chronic kidney disease, or unspecified chronic kidney disease: Secondary | ICD-10-CM | POA: Insufficient documentation

## 2019-02-07 DIAGNOSIS — Z7189 Other specified counseling: Secondary | ICD-10-CM

## 2019-02-07 DIAGNOSIS — Z95828 Presence of other vascular implants and grafts: Secondary | ICD-10-CM

## 2019-02-07 DIAGNOSIS — R809 Proteinuria, unspecified: Secondary | ICD-10-CM | POA: Diagnosis not present

## 2019-02-07 DIAGNOSIS — N183 Chronic kidney disease, stage 3 (moderate): Secondary | ICD-10-CM | POA: Diagnosis not present

## 2019-02-07 DIAGNOSIS — Z452 Encounter for adjustment and management of vascular access device: Secondary | ICD-10-CM | POA: Insufficient documentation

## 2019-02-07 DIAGNOSIS — T451X5A Adverse effect of antineoplastic and immunosuppressive drugs, initial encounter: Secondary | ICD-10-CM

## 2019-02-07 DIAGNOSIS — D61818 Other pancytopenia: Secondary | ICD-10-CM | POA: Insufficient documentation

## 2019-02-07 DIAGNOSIS — D6481 Anemia due to antineoplastic chemotherapy: Secondary | ICD-10-CM

## 2019-02-07 DIAGNOSIS — C5701 Malignant neoplasm of right fallopian tube: Secondary | ICD-10-CM | POA: Diagnosis present

## 2019-02-07 DIAGNOSIS — D539 Nutritional anemia, unspecified: Secondary | ICD-10-CM

## 2019-02-07 LAB — CBC WITH DIFFERENTIAL/PLATELET
Abs Immature Granulocytes: 0.04 10*3/uL (ref 0.00–0.07)
Basophils Absolute: 0.1 10*3/uL (ref 0.0–0.1)
Basophils Relative: 1 %
Eosinophils Absolute: 0.2 10*3/uL (ref 0.0–0.5)
Eosinophils Relative: 3 %
HCT: 31.2 % — ABNORMAL LOW (ref 36.0–46.0)
Hemoglobin: 10.2 g/dL — ABNORMAL LOW (ref 12.0–15.0)
Immature Granulocytes: 1 %
Lymphocytes Relative: 30 %
Lymphs Abs: 2 10*3/uL (ref 0.7–4.0)
MCH: 35.8 pg — ABNORMAL HIGH (ref 26.0–34.0)
MCHC: 32.7 g/dL (ref 30.0–36.0)
MCV: 109.5 fL — ABNORMAL HIGH (ref 80.0–100.0)
Monocytes Absolute: 0.7 10*3/uL (ref 0.1–1.0)
Monocytes Relative: 10 %
Neutro Abs: 3.7 10*3/uL (ref 1.7–7.7)
Neutrophils Relative %: 55 %
Platelets: 190 10*3/uL (ref 150–400)
RBC: 2.85 MIL/uL — ABNORMAL LOW (ref 3.87–5.11)
RDW: 15.8 % — ABNORMAL HIGH (ref 11.5–15.5)
WBC: 6.6 10*3/uL (ref 4.0–10.5)
nRBC: 0 % (ref 0.0–0.2)

## 2019-02-07 LAB — COMPREHENSIVE METABOLIC PANEL
ALT: 13 U/L (ref 0–44)
AST: 23 U/L (ref 15–41)
Albumin: 4 g/dL (ref 3.5–5.0)
Alkaline Phosphatase: 58 U/L (ref 38–126)
Anion gap: 10 (ref 5–15)
BUN: 36 mg/dL — ABNORMAL HIGH (ref 8–23)
CO2: 21 mmol/L — ABNORMAL LOW (ref 22–32)
Calcium: 9.2 mg/dL (ref 8.9–10.3)
Chloride: 104 mmol/L (ref 98–111)
Creatinine, Ser: 1.25 mg/dL — ABNORMAL HIGH (ref 0.44–1.00)
GFR calc Af Amer: 46 mL/min — ABNORMAL LOW (ref >60)
GFR calc non Af Amer: 40 mL/min — ABNORMAL LOW (ref >60)
Glucose, Bld: 95 mg/dL (ref 70–99)
Potassium: 4.5 mmol/L (ref 3.5–5.1)
Sodium: 135 mmol/L (ref 135–145)
Total Bilirubin: 0.3 mg/dL (ref 0.3–1.2)
Total Protein: 7.5 g/dL (ref 6.5–8.1)

## 2019-02-07 LAB — TOTAL PROTEIN, URINE DIPSTICK: Protein, ur: 100 mg/dL — AB

## 2019-02-07 MED ORDER — HEPARIN SOD (PORK) LOCK FLUSH 100 UNIT/ML IV SOLN
500.0000 [IU] | Freq: Once | INTRAVENOUS | Status: AC
  Administered 2019-02-07: 500 [IU]
  Filled 2019-02-07: qty 5

## 2019-02-07 MED ORDER — SODIUM CHLORIDE 0.9% FLUSH
10.0000 mL | Freq: Once | INTRAVENOUS | Status: AC
  Administered 2019-02-07: 13:00:00 10 mL
  Filled 2019-02-07: qty 10

## 2019-02-07 MED ORDER — OLAPARIB 100 MG PO TABS: 200.0000 mg | ORAL_TABLET | Freq: Two times a day (BID) | ORAL | 11 refills | Status: DC

## 2019-02-07 MED ORDER — SODIUM CHLORIDE 0.9% FLUSH
10.0000 mL | Freq: Once | INTRAVENOUS | Status: AC
  Administered 2019-02-07: 14:00:00 10 mL
  Filled 2019-02-07: qty 10

## 2019-02-07 NOTE — Telephone Encounter (Signed)
Oral Oncology Pharmacist Encounter  Received new prescription for Lynparza (olaparib) for the maintenance treatment of recurrent fallopian tube cancer with response to platinum based chemotherapy, planned duration until disease progression or unacceptable toxicity.  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 SCr=1.25, est CrCl ~ 35 mL/min Manufacturer recommends dose reduction to 200mg  twice daily for CrCl 31-50 mL/min  Current medication list in Epic reviewed, no DDIs with [place] identified.  Prescription has been e-scribed to the [facility] for benefits analysis and approval by MD.  Oral Oncology Clinic will continue to follow for insurance authorization, copayment issues, initial counseling and start date.  Johny Drilling, PharmD, BCPS, BCOP  02/07/2019 3:39 PM Oral Oncology Clinic [phone]

## 2019-02-07 NOTE — Assessment & Plan Note (Signed)
She continues to have poorly controlled hypertension and proteinuria I expect the proteinuria will go away and her blood pressure will start to normalize upon discontinuation of bevacizumab For now, I have advised her to continue twice a day blood pressure monitoring I will be in touch with her son next week to continue to guide blood pressure management

## 2019-02-07 NOTE — Assessment & Plan Note (Signed)
Unfortunately, she continues to have heavy proteinuria and uncontrolled hypertension Her treatment has been on hold for almost 2 months I recommend discontinuation of bevacizumab and switch her over to [place] due to positive HRD mutation I discussed the risk, benefits, side effects of Lynparza with her son, [name] and the patient and they agreed to proceed I will get assistance from pharmacy to guide treatment dosing and insurance prior authorization I will see her within a week of treatment to assess toxicity.  I have reviewed guidelines with the patient The goal of treatment is for maintenance treatment, based on publication below  Maintenance Olaparib in Patients with Newly Diagnosed Advanced Ovarian Cancer K. Vladimir Faster, Carlynn Purl, B.-G. Maudie Mercury, A. Jen Mow. Cato Mulligan, A. Floqu[place], Katha Hamming, G.S. Sonke, C. Gourley, S. Franco Nones, ADaryll Brod, C. Orlan Leavens, C. Randa Evens, E.SCorinna Capra, Royce Macadamia, and Mamie Nick DiSilvestro  This article was published on August 20, 2017, at [email or web address] DOI: [email or web address]  BACKGROUND Most women with newly diagnosed advanced ovarian cancer have a relapse within 3 years after standard treatment with surgery and platinum-based chemotherapy. The benefit of the oral poly(adenosine diphosphate?ribose) polymerase inhibitor olaparib in relapsed disease has been well established, but the benefit of olaparib as maintenance therapy in newly diagnosed disease is uncertain. METHODS We conducted an international, randomized, double-blind, phase 3 trial to evaluate the efficacy of olaparib as maintenance therapy in patients with newly diagnosed advanced Academic librarian of Gynecology and Obstetrics stage III or IV) high-grade serous or endometrioid ovarian cancer, primary peritoneal cancer, or fallopian-tube cancer (or a combination thereof) with a mutation in BRCA1, BRCA2, or both (BRCA1/2) who had a complete or  partial clinical response after platinum based chemotherapy. The patients were randomly assigned, in a 2:1 ratio, to receive olaparib tablets (300 mg twice daily) or placebo. The primary end point was progression-free survival. RESULTS Of the 391 patients who underwent randomization, 260 were assigned to receive olaparib and 131 to receive placebo. A total of 388 patients had a centrally confirmed germline BRCA1/2 mutation, and 2 patients had a centrally confirmed somatic BRCA1/2 mutation. After a median follow-up of 41 months, the risk of disease progression or death was 70% lower with olaparib than with placebo Deatra Ina?Meier estimate of the rate of freedom from disease progression and from death at 3 years, 60% vs. 27%; hazard ratio for disease progression or death, 0.30; 95% confidence interval, 0.23 to 0.41; P<0.001). Adverse events were consistent with the known toxic effects of olaparib. CONCLUSIONS The use of maintenance therapy with olaparib provided a substantial benefit with regard to progression-free survival among women with newly diagnosed advanced ovarian cancer and a BRCA1/2 mutation, with a 70% lower risk of disease progression or death with olaparib than with placebo. (Funded by [employer] and [employer]; [employer].gov number, [id number])  The most common adverse events that are discussed included low blood count, fatigue, diarrhea and nausea. The risks, benefits, side effects of treatment were fully discussed with the patient and she agreed to proceed

## 2019-02-07 NOTE — Assessment & Plan Note (Signed)
We have some discussion about goals of care would be to maximize function and supportive care while she continues on maintenance treatment With recurrence of disease, typically this is non-curative but with recent excellent response to treatment, we will continue treatment indefinitely.  Her son and daughter are her healthcare power of attorney I have extensive discussion with them over the telephone today

## 2019-02-07 NOTE — Progress Notes (Signed)
[facility] OFFICE PROGRESS NOTE  Patient Care Team: Patient, No Pcp Per as PCP - General ([place]) Ileana Roup, MD as Consulting Physician (General Surgery)  ASSESSMENT & PLAN:  Fallopian tube cancer, carcinoma ([place]) Unfortunately, she continues to have heavy proteinuria and uncontrolled hypertension Her treatment has been on hold for almost 2 months I recommend discontinuation of bevacizumab and switch her over to [place] due to positive HRD mutation I discussed the risk, benefits, side effects of Lynparza with her son, [name] and the patient and they agreed to proceed I will get assistance from pharmacy to guide treatment dosing and insurance prior authorization I will see her within a week of treatment to assess toxicity.  I have reviewed guidelines with the patient The goal of treatment is for maintenance treatment, based on publication below  Maintenance Olaparib in Patients with Newly Diagnosed Advanced Ovarian Cancer K. Vladimir Faster, Carlynn Purl, B.-G. Maudie Mercury, A. Jen Mow. Cato Mulligan, A. Floquet, Katha Hamming, G.S. Sonke, C. Gourley, S. Franco Nones, ADaryll Brod, C. Orlan Leavens, C. Randa Evens, E.SCorinna Capra, Royce Macadamia, and Mamie Nick DiSilvestro  This article was published on August 20, 2017, at [email or web address] DOI: [email or web address]  BACKGROUND Most women with newly diagnosed advanced ovarian cancer have a relapse within 3 years after standard treatment with surgery and platinum-based chemotherapy. The benefit of the oral poly(adenosine diphosphate?ribose) polymerase inhibitor olaparib in relapsed disease has been well established, but the benefit of olaparib as maintenance therapy in newly diagnosed disease is uncertain. METHODS We conducted an international, randomized, double-blind, phase 3 trial to evaluate the efficacy of olaparib as maintenance therapy in patients with newly diagnosed advanced [facility]ne[facility] Obstetrics stage III or IV) high-grade serous or endometrioid ovarian cancer, primary peritoneal cancer, or fallopian-tube cancer (or a combination thereof) with a mutation in BRCA1, BRCA2, or both (BRCA1/2) who had a complete or partial clinical response after platinum based chemotherapy. The patients were randomly assigned, in a 2:1 ratio, to receive olaparib tablets (300 mg twice daily) or placebo. The primary end point was progression-free survival. RESULTS Of the 391 patients who underwent randomization, 260 were assigned to receive olaparib and 131 to receive placebo. A total of 388 patients had a centrally confirmed germline BRCA1/2 mutation, and 2 patients had a centrally confirmed somatic BRCA1/2 mutation. After a median follow-up of 41 months, the risk of disease progression or death was 70% lower with olaparib than with placebo Deatra Ina?Meier estimate of the rate of freedom from disease progression and from death at 3 years, 60% vs. 27%; hazard ratio for disease progression or death, 0.30; 95% confidence interval, 0.23 to 0.41; P<0.001). Adverse events were consistent with the known toxic effects of olaparib. CONCLUSIONS The use of maintenance therapy with olaparib provided a substantial benefit with regard to progression-free survival among women with newly diagnosed advanced ovarian cancer and a BRCA1/2 mutation, with a 70% lower risk of disease progression or death with olaparib than with placebo. (Funded by [employer] and [employer]; [employer].gov number, [id number])  The most common adverse events that are discussed included low blood count, fatigue, diarrhea and nausea. The risks, benefits, side effects of treatment were fully discussed with the patient and she agreed to proceed    Essential hypertension She continues to have poorly controlled hypertension and proteinuria I expect the proteinuria will go away and her blood pressure will start to  normalize upon discontinuation of bevacizumab For now, I have advised her  to continue twice a day blood pressure monitoring I will be in touch with her son next week to continue to guide blood pressure management  Pancytopenia, acquired ([place]) Her pancytopenia is almost completely resolved We will continue close blood count monitoring while on chemotherapy Due to prior history of severe pancytopenia, reduced kidney function and her age, I will start her on lower dose Lynparza  Goals of care, counseling/discussion We have some discussion about goals of care would be to maximize function and supportive care while she continues on maintenance treatment With recurrence of disease, typically this is non-curative but with recent excellent response to treatment, we will continue treatment indefinitely.  Her son and daughter are her healthcare power of attorney I have extensive discussion with them over the telephone today   Orders Placed This Encounter  Procedures  . CA 125    Standing Status:   Standing    Number of Occurrences:   11    Standing Expiration Date:   02/07/2020    INTERVAL HISTORY: Please see below for problem oriented charting. She returns for further treatment today I also spoke with her daughter and her son over the telephone during the consultation She feels fine.  Energy level is improving She has lost some weight but she thinks she have lost some fluid weight around her ankles She denies recent nausea, changes in bowel habits or abdominal pain No recent infection, fever or chills She denies dizziness or headache with recent high blood pressure   SUMMARY OF ONCOLOGIC HISTORY: Oncology History   Neg genetics.  HRD test was done on peritoneal fluid from 07/18/2018: HRD +     Fallopian tube cancer, carcinoma ([place])   06/18/2016 Tumor Marker    Patient's tumor was tested for the following markers: CA-125 Results of the tumor marker test revealed 298.    06/23/2016 PET  scan    Diffuse hypermetabolic peritoneal carcinomatosis in the abdomen and pelvis, internal mammary adenopathy in the chest, hypermetabolic right paracardiac/diaphragmatic LN, bilateral pleural effusion and moderate ascites    06/24/2016 Procedure    Therapeutic paracentesis    06/24/2016 Pathology Results    Positive for adenocarcinoma, Mullerian primary    06/28/2016 Procedure    She underwent CT guided biopsy of omentum    06/28/2016 Pathology Results    High grade serous involving the omentum, positive for p53, PAX8, WT1 and p16.    07/01/2016 Procedure    Findings/impression:   1. Sonographic evaluation of the right internal jugular vein confirms patency. Sonography was required to gain central venous access. An image documenting patency and needle access was saved.   2. Successful placement of right internal jugular vein subcutaneous Mediport as described. Spot film shows the catheter tip at the cavoatrial junction. No pneumothorax. The Mediport aspirates and flushes normally.    07/02/2016 - 08/12/2016 Chemotherapy    The patient had 3 cycles of carboplatin and taxol chemotherapy treatment.      08/29/2016 Imaging    Ct scan of chest, abdomen and pelvis showed marked improvement and resolution of thoracic adenopathy    09/08/2016 Surgery    She underwent interval debulking surgery with TAH/BSO and infracolic omentectomy by Dr. Wynelle Cleveland    09/08/2016 Pathology Results    Right tube: scant serous carcinoma. No viable tumor. Left tube and ovary: normal. Omentum: low volume serous carcinoma with few psamomma bodies.    10/07/2016 - 11/18/2016 Chemotherapy    The patient had 3 more cycles of carboplatin and taxol  for chemotherapy treatment.      02/07/2017 Imaging    Impression:  1. Colonic diverticulosis without acute diverticulitis. 2. Stable calcification right kidney without hydronephrosis. 3. No acute process or significant interval change has occurred since August 29, 2016.    05/09/2018 Imaging    Impression:  Findings are most suspicious for acute sigmoid colonic diverticulitis with intraloop abscess formation.    05/14/2018 Imaging    Impression: The pelvic fluid collection contiguous with sigmoid colonic diverticulitis changes has not significant change since May 09, 2018. No new fluid collections are seen    06/28/2018 Imaging    IMPRESSION: Changes consistent with diverticulitis with free fluid within the pelvis. No focal contained abscess is seen. A previously noted fluid collection deep within the pelvis has resolved in the interval.   Stable nonobstructing right renal stone.  The remainder of the exam is stable from the prior study.     07/11/2018 PET scan    Small volume ascites, new stranding and omental nodularity    07/13/2018 Imaging    Findings are consistent with peritoneal carcinomatosis given the history of ovarian carcinoma. There is omental caking as well as stranding within various areas of the peritoneal fat. There is also wall thickening of the sigmoid colon with adjacent stranding. This finding can also be related to peritoneal carcinomatosis and implants although an inflammatory process of the sigmoid colon is not excluded.   Right nephrolithiasis.  Ileus pattern.    07/16/2018 Imaging    Impression:   Findings of peritoneal carcinomatosis, rapidly progressive. Not present in July 2019, progressed when compared to September 13.  Multiple bowel loops are distorted but no distention or transition zone to suggest a component of active obstruction.    07/18/2018 Procedure    Findings/impression:  1. Sonographic evaluation of ascites. Sonography was required to gain access to ascites. An image documenting ascites was saved. 2. Successful ultrasound guided paracentesis using a temporary peritoneal drainage catheter inserted in the right upper quadrant as described yielding 600cc of ascites.     07/20/2018  Echocardiogram    General:  The patient was in normal sinus rhythm.  Left Ventricle:  Left ventricular ejection fraction was normal, estimated in the range of 60 to 65%. The left ventricular cavity size appears normal. The left ventricular wall thickness appears normal. The left ventricle is thickened in a fashion  consistent with mild concentric hypertrophy. Doppler tissue velocities and mitral inflow profile are consistent with Stage I diastolic dysfunction. The calculated ejection fraction is 68.1 %.  Right Ventricle:  The right ventricle appears normal in size and function.  Left Atrium:  The left atrium appears normal.  Right Atrium:  The right atrium appears normal.  Aortic Valve:  The structure of the aortic valve is tricuspid. There is evidence of trivial (trace) aortic regurgitation.  Mitral Valve:  There is no evidence of mitral regurgitation. The mitral valve appears normal in structure.  Pulmonic Valve:  There is evidence of trace (trivial) pulmonic regurgitation. The pulmonic valve appears normal in structure.  Tricuspid Valve:  There is evidence of trace (trivial) tricuspid regurgitation. The tricuspid valve appears normal in structure.  Pericardium:  There is no evidence of pericardial effusion.  Aorta:  The visualized portions of the aorta (ascending aorta, aortic root, and aortic arch) appear normal.  Pulmonic Artery:  Unable to obtain RVSP due to insufficient tricuspid regurgitation jet.  Conclusions: Left ventricular ejection fraction was normal, estimated in the range of 60 to  65%. The left ventricular cavity size appears normal. The left ventricular wall thickness appears normal. The left ventricle is thickened in a fashion consistent with mild concentric hypertrophy. Doppler tissue velocities and mitral inflow profile are consistent with Stage I diastolic dysfunction. The calculated ejection fraction is 68.1%.There is evidence of trivial (trace) aortic  regurgitation.There is evidence of trace (trivial) pulmonic regurgitation.There is evidence of trace (trivial) tricuspid regurgitation.There is no evidence of pericardial effusion.Unable to obtain RVSP due to insufficient tricuspid regurgitation jet.    07/23/2018 - 12/25/2018 Chemotherapy    The patient had carboplatin, doxil and Avastin for chemotherapy treatment x 6 cycles    08/01/2018 Imaging    Ct head showed no injuries.    08/01/2018 Procedure    Findings/impression:  1. Sonographic evaluation of ascites. Sonography was required to gain access to ascites. An image documenting ascites was saved. 2. Successful ultrasound guided paracentesis using a temporary peritoneal drainage catheter inserted in the right upper quadrant as described yielding 400cc of ascites.     08/06/2018 Tumor Marker    Patient's tumor was tested for the following markers: CA-125 Results of the tumor marker test revealed 373    08/20/2018 Tumor Marker    Patient's tumor was tested for the following markers: CA-125 Results of the tumor marker test revealed 51.    10/03/2018 Genetic Testing    Patient has genetic testing done for genetics testing using her saliva. 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  Liver and spleen homogeneously enhance. No lesion seen.  The adjacent gallbladder is surgically absent.  The adrenal glands, kidneys, pancreas are normal.  Pelvic contents are remarkable for an absent uterus.  The mesenteric stranding has nearly completely resolved. Minimal reticular prominence throughout the pelvis.  No residual ascites.  No bowel wall thickening or. No dilated loops or transition zone.  The appendix is not seen. But no inflammatory changes in the right lower quadrant.  Osseous structures are intact.    11/27/2018 Imaging    Ct scan abdomen and pelvis showed colonic  diverticulosis and nonspecific fluid levels in the bowel    12/31/2018 Tumor Marker    Patient's tumor was tested for the following markers: CA-125 Results of the tumor marker test revealed 14.4    01/01/2019 Imaging    1. LEFT jugular Port-A-Cath catheter tip projects over the mid SVC. 2. No acute cardiopulmonary disease.     01/04/2019 Echocardiogram    1. The left ventricle has normal systolic function with an ejection fraction of 60-65%. The cavity size was normal. Left ventricular diastolic Doppler parameters are consistent with impaired relaxation.  2. The right ventricle has normal systolic function. The cavity was normal. There is no increase in right ventricular wall thickness.  3. The mitral valve is normal in structure.  4. The tricuspid valve is normal in structure.  5. The aortic valve is normal in structure.  6. The pulmonic valve was normal in structure.  7. 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      REVIEW OF SYSTEMS:   Constitutional: Denies fevers, chills or abnormal weight loss Eyes: Denies blurriness of vision Ears, nose, mouth, throat, and face: Denies mucositis or sore throat Respiratory: Denies cough, dyspnea or wheezes Cardiovascular: Denies palpitation, chest discomfort or lower extremity swelling Gastrointestinal:  Denies nausea, heartburn or change in bowel habits Skin: Denies  abnormal skin rashes Lymphatics: Denies new lymphadenopathy or easy bruising Neurological:Denies numbness, tingling or new weaknesses Behavioral/Psych: Mood is stable, no new changes  All other systems were reviewed with the patient and are negative.  I have reviewed the past medical history, past surgical history, social history and family history with the patient and they are unchanged from previous note.  ALLERGIES:  is allergic to latex.  MEDICATIONS:  Current Outpatient Medications  Medication Sig Dispense Refill  . ALPRAZolam (XANAX) 0.25 MG tablet  Take 0.25 mg by mouth at bedtime as needed for anxiety.    Marland Kitchen amLODipine (NORVASC) 10 MG tablet Take 10 mg by mouth daily.    . Ascorbic Acid (VITAMIN C PO) Take 500 mg by mouth daily.     Marland Kitchen atenolol (TENORMIN) 25 MG tablet Take 1 tablet (25 mg total) by mouth daily. 30 tablet 11  . bevacizumab-awwb in sodium chloride 0.9 % 100 mL Inject into the vein once.    . Cholecalciferol (VITAMIN D PO) Take 1 tablet by mouth daily. D3 1000units    . escitalopram (LEXAPRO) 10 MG tablet Take 1 tablet (10 mg total) by mouth daily. 90 tablet 1  . fluticasone (FLONASE) 50 MCG/ACT nasal spray Place 1 spray into both nostrils daily.    . hydrALAZINE (APRESOLINE) 50 MG tablet Take 1 tablet (50 mg total) by mouth 3 (three) times daily. 90 tablet 1  . hydrochlorothiazide (HYDRODIURIL) 12.5 MG tablet Take 2 tablets (25 mg total) by mouth daily. Take in the morning. 30 tablet 3  . losartan (COZAAR) 100 MG tablet Take 100 mg by mouth daily.    . Multiple Vitamin (MULTIVITAMIN) tablet Take 1 tablet by mouth daily.    . ondansetron (ZOFRAN) 8 MG tablet Take 8 mg by mouth every 8 (eight) hours as needed for nausea or vomiting.    Marland Kitchen oxyCODONE-acetaminophen (PERCOCET/ROXICET) 5-325 MG tablet Take 1 tablet by mouth every 4 (four) hours as needed for severe pain. 20 tablet 0  . polyethylene glycol (MIRALAX / GLYCOLAX) 17 g packet Take 17 g by mouth 2 (two) times daily.    . Acetaminophen 325 MG CAPS Take 650 mg by mouth every 6 (six) hours as needed.    Marland Kitchen L-Methylfolate-Algae-B12-B6 (METANX PO) Take 1 tablet by mouth 2 (two) times daily.    Marland Kitchen olaparib (LYNPARZA) 100 MG tablet Take 2 tablets (200 mg total) by mouth 2 (two) times daily. Swallow whole. May take with food to decrease nausea and vomiting. 60 tablet 11  . rosuvastatin (CRESTOR) 10 MG tablet Take 10 mg by mouth at bedtime.      No current facility-administered medications for this visit.     PHYSICAL EXAMINATION: ECOG PERFORMANCE STATUS: 1 - Symptomatic but  completely ambulatory  Vitals:   02/07/19 1247  BP: (!) 161/72  Pulse: 64  Resp: 16  Temp: 98.1 F (36.7 C)  SpO2: 97%   Filed Weights   02/07/19 1247  Weight: 144 lb 3.2 oz (65.4 kg)    GENERAL:alert, no distress and comfortable SKIN: skin color, texture, turgor are normal, no rashes or significant lesions EYES: normal, Conjunctiva are pink and non-injected, sclera clear OROPHARYNX:no exudate, no erythema and lips, buccal mucosa, and tongue normal  NECK: supple, thyroid normal size, non-tender, without nodularity LYMPH:  no palpable lymphadenopathy in the cervical, axillary or inguinal LUNGS: clear to auscultation and percussion with normal breathing effort HEART: regular rate & rhythm and no murmurs and no lower extremity edema ABDOMEN:abdomen soft, non-tender and  normal bowel sounds Musculoskeletal:no cyanosis of digits and no clubbing  NEURO: alert & oriented x 3 with fluent speech, no focal motor/sensory deficits  LABORATORY DATA:  I have reviewed the data as listed    Component Value Date/Time   NA 135 02/07/2019 1235   NA 139 06/27/2018 1649   K 4.5 02/07/2019 1235   CL 104 02/07/2019 1235   CO2 21 (L) 02/07/2019 1235   GLUCOSE 95 02/07/2019 1235   BUN 36 (H) 02/07/2019 1235   BUN 17 06/27/2018 1649   CREATININE 1.25 (H) 02/07/2019 1235   CALCIUM 9.2 02/07/2019 1235   PROT 7.5 02/07/2019 1235   PROT 6.8 05/24/2018 0851   ALBUMIN 4.0 02/07/2019 1235   ALBUMIN 4.2 05/24/2018 0851   AST 23 02/07/2019 1235   ALT 13 02/07/2019 1235   ALKPHOS 58 02/07/2019 1235   BILITOT 0.3 02/07/2019 1235   BILITOT 0.2 05/24/2018 0851   GFRNONAA 40 (L) 02/07/2019 1235   GFRAA 46 (L) 02/07/2019 1235    No results found for: SPEP, UPEP  Lab Results  Component Value Date   WBC 6.6 02/07/2019   NEUTROABS 3.7 02/07/2019   HGB 10.2 (L) 02/07/2019   HCT 31.2 (L) 02/07/2019   MCV 109.5 (H) 02/07/2019   PLT 190 02/07/2019      Chemistry      Component Value Date/Time    NA 135 02/07/2019 1235   NA 139 06/27/2018 1649   K 4.5 02/07/2019 1235   CL 104 02/07/2019 1235   CO2 21 (L) 02/07/2019 1235   BUN 36 (H) 02/07/2019 1235   BUN 17 06/27/2018 1649   CREATININE 1.25 (H) 02/07/2019 1235      Component Value Date/Time   CALCIUM 9.2 02/07/2019 1235   ALKPHOS 58 02/07/2019 1235   AST 23 02/07/2019 1235   ALT 13 02/07/2019 1235   BILITOT 0.3 02/07/2019 1235   BILITOT 0.2 05/24/2018 0851      All questions were answered. The patient knows to call the clinic with any problems, questions or concerns. No barriers to learning was detected.  I spent 30 minutes counseling the patient face to face. The total time spent in the appointment was 40 minutes and more than 50% was on counseling and review of test results  Heath Lark, MD 02/07/2019 2:11 PM

## 2019-02-07 NOTE — Assessment & Plan Note (Signed)
Her pancytopenia is almost completely resolved We will continue close blood count monitoring while on chemotherapy Due to prior history of severe pancytopenia, reduced kidney function and her age, I will start her on lower dose [place]

## 2019-02-08 ENCOUNTER — Telehealth: Payer: Self-pay

## 2019-02-08 MED ORDER — HYDROCHLOROTHIAZIDE 25 MG PO TABS: 25.0000 mg | ORAL_TABLET | Freq: Every day | ORAL | 3 refills | Status: DC

## 2019-02-08 NOTE — Telephone Encounter (Signed)
Oral Oncology Patient Advocate Encounter  Received notification from [employer] that prior authorization for Lisa Moyer is required.  PA submitted on CoverMyMeds Key [id number] Status is pending  Oral Oncology Clinic will continue to follow.  [place] Patient [place] Phone [phone] Fax [phone] 02/08/2019    8:44 AM

## 2019-02-08 NOTE — Telephone Encounter (Signed)
Oral Oncology Patient Advocate Encounter  Was successful in securing patient an $4,000 grant from [facility] [place] (PAF) to provide copayment coverage for her Lonie [place].  This will keep the out of pocket expense at $0.    I have spoken with the patients son, [name].    The billing information is as follows and has been shared with [place].   Member ID: [id number] Group ID: [id number] RxBin: [id number] Dates of Eligibility: 02/08/19 through 02/08/20  [place] Patient [place] Phone [phone] Fax [phone] 02/08/2019    2:35 PM

## 2019-02-08 NOTE — Telephone Encounter (Signed)
Oral Oncology Patient Advocate Encounter  Prior Authorization for Lisa Moyer has been approved.    PA# [id number] Effective dates: 02/08/19 through 10/31/19  Oral Oncology Clinic will continue to follow.   [place] Patient Lisa Moyer Phone [phone] Fax [phone] 02/08/2019    10:04 AM

## 2019-02-08 NOTE — Telephone Encounter (Signed)
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   Counseled on administration, dosing, side effects, monitoring, drug-food interactions, safe handling, storage, and disposal.  Patient will take Lisa Moyer 126m tablets, 2 tablets (2037m by mouth 2 times daily without regard to food.  Lisa Moyer informed patient should avoid grapefruit or grapefruit juice while on therapy with Lisa Moyer.  Lisa Moyer date: 02/13/2019   Adverse effects include but are not limited to: nausea, vomiting, diarrhea, taste changes, mouth sores, fatigue, constipation, decreased blood counts, and joint pain. Lisa Moyer informed that pneumonitis (including some fatalities) has occurred rarely. Myelodysplastic syndrome/acute myeloid leukemia (MDS/AML) have been reported (rarely) in clinical trials.  They have anti-emetic on hand and will adminsiter it if nausea develops.   Patient manages intermittent nausea at baseline, they will be mindful of any changes that may occur.  Patient will obtain anti diarrheal and alert the office of 4 or more loose stools above baseline.  Patient manages intermittent constipation currently, which is exacerbated with opioid administration. They will alert the office if current strategies of MiraLax and prune juice lose effectiveness.  Reviewed importance of keeping a medication schedule and plan for any missed doses. Reviewed mechanism of PARP inhibition, especially in the case of HRD positivity.  Lisa Moyer voiced understanding and appreciation.   All questions answered. Medication reconciliation performed and medication/allergy list updated.  Insurance authorization for Lisa Peakas been obtained. Oral oncology patient advocate was successful in  securing copayment grant from [employer] cover some out of pocket expenses of Lisa Moyer the pharmacy.  This will ship from the [employer] 02/11/19 to deliver to patient's home on 02/12/19.  Lisa Moyer informed the pharmacy will reach out 5-7 days prior to needing next fill of Lisa Moyer to coordinate continued medication acquisition to prevent break in therapy.  Lisa Moyer to call the office with questions or concerns. Oral Oncology Clinic will continue to follow.  Lisa DrillingPharmD, BCPS, BCOP  02/08/2019   2:21 PM Oral Oncology Clinic [phone]

## 2019-02-08 NOTE — Telephone Encounter (Signed)
Oral Oncology Patient Advocate Lisa Moyer copay is $2800.08 this is not affordable for the patient. There are no grant funds available at the moment, I will keep an eye on that to open.  I have filled out an application for [employer] and faxed it with the doctors signature today, in an attempt to lower the patients out of pocket cost to $0. [employer] does not require the patients signature or the income documentation during the pandemic.  I called the patient and spoke to her son, [name]. I gave him this information, he will call me back with the patients income information that I will need if a grant opens up. He agreed to proceed with the manufacturer assistance application.   This encounter will be updated until final determination.  [place] Patient [place] Phone [phone] Fax [phone] 02/08/2019   10:15 AM

## 2019-02-11 ENCOUNTER — Telehealth: Payer: Self-pay

## 2019-02-11 MED FILL — LYNPARZA 100 MG TAB: 100 | 30 days supply | Qty: 120 | Fill #0

## 2019-02-11 NOTE — Telephone Encounter (Signed)
Oral Oncology Patient Advocate Encounter  Confirmed with Chrisman that Lisa Moyer was shipped on 02/11/19 with a $0 copay using PAF grant.   [place] Patient [place] Phone [phone] Fax [phone] 02/11/2019   3:20 PM

## 2019-02-11 NOTE — Telephone Encounter (Signed)
Tharon Aquas, PT called and ask for a verbal order for PT, 1 X a week for 4 weeks for endurance training.  Called back and given verbal order for above, per Dr. Alvy Bimler.

## 2019-02-12 ENCOUNTER — Telehealth (INDEPENDENT_AMBULATORY_CARE_PROVIDER_SITE_OTHER): Payer: Medicare Other | Admitting: Family Medicine

## 2019-02-12 ENCOUNTER — Other Ambulatory Visit: Payer: Self-pay

## 2019-02-12 DIAGNOSIS — I10 Essential (primary) hypertension: Secondary | ICD-10-CM | POA: Diagnosis not present

## 2019-02-12 DIAGNOSIS — C5700 Malignant neoplasm of unspecified fallopian tube: Secondary | ICD-10-CM

## 2019-02-12 NOTE — Progress Notes (Signed)
CC- BP/reestablish care with Dr Carlota Raspberry. Call [phone] this is the daughter and she will have mother for the call as well.  Patient will also have Bp reading when they speak with you. Dr Gorsuch/oncologist has been following patient bp along with cancer. Patient just finished up with Chemo and is doing fine at [facility] time.

## 2019-02-12 NOTE — Patient Instructions (Addendum)
  Good talking to you today.  Let me know if I can help from primary care standpoint, or if Dr. Alvy Bimler would like [place] to assume treatment from blood pressure standpoint.  Take care.   If you have lab work done today you will be contacted with your lab results within the next 2 weeks.  If you have not heard from [place] then please contact us. The fastest way to get your results is to register for My Chart.   IF you received an x-ray today, you will receive an invoice from [facility]. Please contact [facility] at [phone] with questions or concerns regarding your invoice.   IF you received labwork today, you will receive an invoice from [place]. Please contact [facility] at [phone] with questions or concerns regarding your invoice.   Our billing staff will not be able to assist you with questions regarding bills from these companies.  You will be contacted with the lab results as soon as they are available. The fastest way to get your results is to activate your My Chart account. Instructions are located on the last page of this paperwork. If you have not heard from [place] regarding the results in 2 weeks, please contact this office.

## 2019-02-12 NOTE — Progress Notes (Signed)
 Virtual Visit via Telephone Note  I connected with Lisa Moyer on 02/12/19 at 3:32 PM by telephone and verified that I am speaking with the correct person using two identifiers.   I discussed the limitations, risks, security and privacy concerns of performing an evaluation and management service by telephone and the availability of in person appointments. I also discussed with the patient that there may be a patient responsible charge related to this service. The patient expressed understanding and agreed to proceed, consent obtained  Chief complaint:  Hypertension  History of Present Illness:  Previous patient of Dr. Shaw.  I saw her for acute visit in September 2019. She has been under the care of Dr. Gorsuch with [facility] for fallopian tube carcinoma.  Note reviewed from April 9, continued to have heavy proteinuria and uncontrolled hypertension with treatment on hold for 2 months.  Plan to adjust chemotherapeutics at that time, s/p 6 months of chemo, now plan for maintenance pills.  Thought that the chemo caused BP to go up. Currently on atenolol 25 mg daily, hydralazine 50 mg 3 times daily, HCTZ 25 mg daily, and losartan 100 mg daily noted on medication list.  Most recent creatinine 5 days ago was 1.25, up from 1.02 2 weeks ago.  EGFR 40 down from 51 previously.  100 of protein on urine dipstick 5 days ago.  Plans to establish care with me.  Plan for maintenance meds form oncology at this time.  Home readings: 144/71 this morning, then 145/78 BP 161/72 on 4/9 - reports increasing dose of HCTZ to 25mg QD.  No chest pain, dyspnea, lightheadedness, or dizziness.    Patient Active Problem List   Diagnosis Date Noted  . Port-A-Cath in place 02/07/2019  . Physical debility 01/11/2019  . Goals of care, counseling/discussion 01/11/2019  . Dyspnea on exertion 01/01/2019  . Pancytopenia, acquired (HCC) 01/01/2019  . Essential hypertension 01/01/2019  . Anemia  due to antineoplastic chemotherapy 12/31/2018  . Deficiency anemia 12/31/2018  . Diarrhea 12/31/2018  . Other fatigue 12/31/2018  . Fallopian tube cancer, carcinoma (HCC) 12/24/2018  . Diverticulitis of colon 06/27/2018   Past Medical History:  Diagnosis Date  . Arthritis    mild  . Blood transfusion without reported diagnosis   . DDD (degenerative disc disease), lumbar 05/09/2018   CT shows multilevel degenerative hypertrophic changes worst at L2-3  . Diverticulitis   . Fallopian tube cancer, carcinoma (HCC)   . Fatty liver 06/28/2018   seen on CT  . Hyperlipemia   . Hypertension   . Kidney stone 05/2018   Right 6mm nonobstructing renal stone seen on CT - pt asymptomatic  . Pneumonia   . Sliding hiatal hernia 06/28/2018   seen on CT   Past Surgical History:  Procedure Laterality Date  . ABDOMINAL HYSTERECTOMY  08/2016   total with BSO due to fallopian tube carcinoma, and pre-adjuvent and post-operative chemo as well each x 3 mos  . CHOLECYSTECTOMY  2001  . OTHER SURGICAL HISTORY  2017-2018   Chemotherapy    Allergies  Allergen Reactions  . Latex Other (See Comments)    She has a sensitivity to Kiwi so prior drs told her to avoid latex and listed it as an allergy   Prior to Admission medications   Medication Sig Start Date End Date Taking? Authorizing Provider  Acetaminophen 325 MG CAPS Take 650 mg by mouth every 6 (six) hours as needed.   Yes [provider]    amLODipine (NORVASC) 10 MG tablet Take 10 mg by mouth daily.   Yes [provider]  Ascorbic Acid (VITAMIN C PO) Take 500 mg by mouth daily.    Yes [provider]  atenolol (TENORMIN) 25 MG tablet Take 1 tablet (25 mg total) by mouth daily. 01/10/19  Yes Gorsuch, Ni, MD  Cholecalciferol (VITAMIN D PO) Take 1 tablet by mouth daily. D3 1000units   Yes [provider]  escitalopram (LEXAPRO) 10 MG tablet Take 1 tablet (10 mg total) by mouth daily. 01/10/19  Yes Gorsuch, Ni, MD   fluticasone (FLONASE) 50 MCG/ACT nasal spray Place 1 spray into both nostrils daily.   Yes [provider]  hydrALAZINE (APRESOLINE) 50 MG tablet Take 1 tablet (50 mg total) by mouth 3 (three) times daily. 01/24/19  Yes Gorsuch, Ni, MD  hydrochlorothiazide (HYDRODIURIL) 25 MG tablet Take 1 tablet (25 mg total) by mouth daily. 02/08/19  Yes Gorsuch, Ni, MD  losartan (COZAAR) 100 MG tablet Take 100 mg by mouth daily.   Yes [provider]  Multiple Vitamin (MULTIVITAMIN) tablet Take 1 tablet by mouth daily.   Yes [provider]  olaparib (LYNPARZA) 100 MG tablet Take 2 tablets (200 mg total) by mouth 2 (two) times daily. Swallow whole. May take with food to decrease nausea and vomiting. 02/07/19  Yes Gorsuch, Ni, MD  ondansetron (ZOFRAN) 8 MG tablet Take 8 mg by mouth every 8 (eight) hours as needed for nausea or vomiting.   Yes [provider]  oxyCODONE-acetaminophen (PERCOCET/ROXICET) 5-325 MG tablet Take by mouth every 4 (four) hours as needed for severe pain.   Yes [provider]  polyethylene glycol (MIRALAX / GLYCOLAX) 17 g packet Take 17 g by mouth 2 (two) times daily.   Yes [provider]  bevacizumab-awwb in sodium chloride 0.9 % 100 mL Inject into the vein once.    [provider]   Social History   Socioeconomic History  . Marital status: Widowed    Spouse name: Not on file  . Number of children: 3  . Years of education: Not on file  . Highest education level: Not on file  Occupational History  . Occupation: retired business  Social Needs  . Financial resource strain: Not on file  . Food insecurity:    Worry: Not on file    Inability: Not on file  . Transportation needs:    Medical: Not on file    Non-medical: Not on file  Tobacco Use  . Smoking status: Never Smoker  . Smokeless tobacco: Never Used  Substance and Sexual Activity  . Alcohol use: Never    Frequency: Never  . Drug use: Never  . Sexual activity:  Not on file  Lifestyle  . Physical activity:    Days per week: Not on file    Minutes per session: Not on file  . Stress: Not on file  Relationships  . Social connections:    Talks on phone: Not on file    Gets together: Not on file    Attends religious service: Not on file    Active member of club or organization: Not on file    Attends meetings of clubs or organizations: Not on file    Relationship status: Not on file  . Intimate partner violence:    Fear of current or ex partner: Not on file    Emotionally abused: Not on file    Physically abused: Not on file    Forced   sexual activity: Not on file  Other Topics Concern  . Not on file  Social History Narrative  . Not on file     Observations/Objective: No distress. Understanding expressed. Requested I call dtr with info.  See below.  dpr verified.     Assessment and Plan: Essential hypertension  Carcinoma of fallopian tube, unspecified laterality ([place])  Spoke with dtr - she feel like HTN is being managed by Dr. Alvy Bimler, but plan to follow up with [place] at discretion of her oncologist.  As I have seen her previously, would feel most comfortable if I  follow her for now from primary care standpoint.  Agreed to be PCP at this time, but may need to transition to other provider in office depending on availability.    Follow Up Instructions: Follow-up as needed or when transition from oncology.   I discussed the assessment and treatment plan with the patient. The patient was provided an opportunity to ask questions and all were answered. The patient agreed with the plan and demonstrated an understanding of the instructions.   The patient was advised to call back or seek an in-person evaluation if the symptoms worsen or if the condition fails to improve as anticipated.  I provided 11 minutes of non-face-to-face time during this encounter., then 8 min with dtr.   Signed,   [name], MD [facility].  02/12/19

## 2019-02-12 NOTE — Telephone Encounter (Signed)
Oral Oncology Patient Advocate Encounter  I was able to secure a grant with [employer] to cover the [place] at [facility].   I called [employer] and put this application on hold for now.  If we need it in the future depending on the time frame I will either call to open it back up or submit a new application.  [name] [name] Phone [phone] Fax [phone] 02/12/2019   10:51 AM

## 2019-02-13 ENCOUNTER — Encounter: Payer: Self-pay | Admitting: Hematology and Oncology

## 2019-02-13 ENCOUNTER — Telehealth: Payer: Self-pay

## 2019-02-13 NOTE — Telephone Encounter (Signed)
-----   Message from Heath Lark, MD sent at 02/13/2019  9:49 AM EDT ----- Regarding: call son ERIC Can you call ERIC and verify she has her chemo pills to start? Also what is her BP? I am planning to see her again next Tues

## 2019-02-13 NOTE — Telephone Encounter (Signed)
Called and given below message. He verbalized understanding. She has her chemo pills and started this morning. [name] will send bp readings on mychart. Scheduling has not called to set up appt yet.

## 2019-02-14 ENCOUNTER — Telehealth: Payer: Self-pay | Admitting: Hematology and Oncology

## 2019-02-14 NOTE — Telephone Encounter (Signed)
Spoke with son re lab/fu 4/21

## 2019-02-19 ENCOUNTER — Inpatient Hospital Stay: Payer: Medicare Other

## 2019-02-19 ENCOUNTER — Inpatient Hospital Stay: Payer: Medicare Other | Admitting: Hematology and Oncology

## 2019-02-21 ENCOUNTER — Other Ambulatory Visit: Payer: Self-pay

## 2019-02-21 ENCOUNTER — Inpatient Hospital Stay: Payer: Medicare Other

## 2019-02-21 ENCOUNTER — Inpatient Hospital Stay (HOSPITAL_BASED_OUTPATIENT_CLINIC_OR_DEPARTMENT_OTHER): Payer: Medicare Other | Admitting: Hematology and Oncology

## 2019-02-21 DIAGNOSIS — R1030 Lower abdominal pain, unspecified: Secondary | ICD-10-CM

## 2019-02-21 DIAGNOSIS — I129 Hypertensive chronic kidney disease with stage 1 through stage 4 chronic kidney disease, or unspecified chronic kidney disease: Secondary | ICD-10-CM | POA: Diagnosis not present

## 2019-02-21 DIAGNOSIS — D6481 Anemia due to antineoplastic chemotherapy: Secondary | ICD-10-CM

## 2019-02-21 DIAGNOSIS — T451X5A Adverse effect of antineoplastic and immunosuppressive drugs, initial encounter: Secondary | ICD-10-CM

## 2019-02-21 DIAGNOSIS — C5700 Malignant neoplasm of unspecified fallopian tube: Secondary | ICD-10-CM | POA: Diagnosis not present

## 2019-02-21 DIAGNOSIS — R808 Other proteinuria: Secondary | ICD-10-CM

## 2019-02-21 DIAGNOSIS — C786 Secondary malignant neoplasm of retroperitoneum and peritoneum: Secondary | ICD-10-CM | POA: Diagnosis not present

## 2019-02-21 DIAGNOSIS — N183 Chronic kidney disease, stage 3 unspecified: Secondary | ICD-10-CM

## 2019-02-21 DIAGNOSIS — R809 Proteinuria, unspecified: Secondary | ICD-10-CM

## 2019-02-21 DIAGNOSIS — I10 Essential (primary) hypertension: Secondary | ICD-10-CM

## 2019-02-21 DIAGNOSIS — D61818 Other pancytopenia: Secondary | ICD-10-CM

## 2019-02-21 DIAGNOSIS — D539 Nutritional anemia, unspecified: Secondary | ICD-10-CM

## 2019-02-21 DIAGNOSIS — R197 Diarrhea, unspecified: Secondary | ICD-10-CM

## 2019-02-21 DIAGNOSIS — R112 Nausea with vomiting, unspecified: Secondary | ICD-10-CM

## 2019-02-21 LAB — CBC WITH DIFFERENTIAL/PLATELET
Abs Immature Granulocytes: 0.02 10*3/uL (ref 0.00–0.07)
Basophils Absolute: 0.1 10*3/uL (ref 0.0–0.1)
Basophils Relative: 1 %
Eosinophils Absolute: 0.1 10*3/uL (ref 0.0–0.5)
Eosinophils Relative: 2 %
HCT: 31.6 % — ABNORMAL LOW (ref 36.0–46.0)
Hemoglobin: 10.7 g/dL — ABNORMAL LOW (ref 12.0–15.0)
Immature Granulocytes: 0 %
Lymphocytes Relative: 28 %
Lymphs Abs: 1.9 10*3/uL (ref 0.7–4.0)
MCH: 35.9 pg — ABNORMAL HIGH (ref 26.0–34.0)
MCHC: 33.9 g/dL (ref 30.0–36.0)
MCV: 106 fL — ABNORMAL HIGH (ref 80.0–100.0)
Monocytes Absolute: 0.5 10*3/uL (ref 0.1–1.0)
Monocytes Relative: 8 %
Neutro Abs: 4.2 10*3/uL (ref 1.7–7.7)
Neutrophils Relative %: 61 %
Platelets: 143 10*3/uL — ABNORMAL LOW (ref 150–400)
RBC: 2.98 MIL/uL — ABNORMAL LOW (ref 3.87–5.11)
RDW: 14.2 % (ref 11.5–15.5)
WBC: 6.8 10*3/uL (ref 4.0–10.5)
nRBC: 0 % (ref 0.0–0.2)

## 2019-02-21 LAB — COMPREHENSIVE METABOLIC PANEL
ALT: 10 U/L (ref 0–44)
AST: 18 U/L (ref 15–41)
Albumin: 3.9 g/dL (ref 3.5–5.0)
Alkaline Phosphatase: 60 U/L (ref 38–126)
Anion gap: 11 (ref 5–15)
BUN: 30 mg/dL — ABNORMAL HIGH (ref 8–23)
CO2: 19 mmol/L — ABNORMAL LOW (ref 22–32)
Calcium: 9.3 mg/dL (ref 8.9–10.3)
Chloride: 104 mmol/L (ref 98–111)
Creatinine, Ser: 1.33 mg/dL — ABNORMAL HIGH (ref 0.44–1.00)
GFR calc Af Amer: 43 mL/min — ABNORMAL LOW (ref >60)
GFR calc non Af Amer: 37 mL/min — ABNORMAL LOW (ref >60)
Glucose, Bld: 125 mg/dL — ABNORMAL HIGH (ref 70–99)
Potassium: 4.1 mmol/L (ref 3.5–5.1)
Sodium: 134 mmol/L — ABNORMAL LOW (ref 135–145)
Total Bilirubin: 0.3 mg/dL (ref 0.3–1.2)
Total Protein: 7.6 g/dL (ref 6.5–8.1)

## 2019-02-21 LAB — TOTAL PROTEIN, URINE DIPSTICK: Protein, ur: 30 mg/dL — AB

## 2019-02-21 MED ORDER — HYDROCHLOROTHIAZIDE 25 MG PO TABS: 12.5000 mg | ORAL_TABLET | Freq: Every day | ORAL | 3 refills | Status: DC

## 2019-02-21 MED ORDER — ATENOLOL 25 MG PO TABS: 12.5000 mg | ORAL_TABLET | Freq: Every day | ORAL | 11 refills | Status: DC

## 2019-02-22 ENCOUNTER — Telehealth: Payer: Self-pay | Admitting: Hematology and Oncology

## 2019-02-22 ENCOUNTER — Telehealth: Payer: Self-pay

## 2019-02-22 ENCOUNTER — Encounter: Payer: Self-pay | Admitting: Hematology and Oncology

## 2019-02-22 DIAGNOSIS — R112 Nausea with vomiting, unspecified: Secondary | ICD-10-CM | POA: Insufficient documentation

## 2019-02-22 DIAGNOSIS — N183 Chronic kidney disease, stage 3 unspecified: Secondary | ICD-10-CM | POA: Insufficient documentation

## 2019-02-22 DIAGNOSIS — R109 Unspecified abdominal pain: Secondary | ICD-10-CM | POA: Insufficient documentation

## 2019-02-22 DIAGNOSIS — R808 Other proteinuria: Secondary | ICD-10-CM | POA: Insufficient documentation

## 2019-02-22 LAB — CA 125: Cancer Antigen (CA) 125: 25.9 U/mL (ref 0.0–38.1)

## 2019-02-22 NOTE — Assessment & Plan Note (Signed)
She has stable pancytopenia We will continue to observe closely

## 2019-02-22 NOTE — Telephone Encounter (Signed)
Scheduled appt per 4/24 sch message - unable to reach patients son . Left message with appt date and time

## 2019-02-22 NOTE — Assessment & Plan Note (Signed)
She had proteinuria secondary to uncontrolled hypertension and bevacizumab With good control of blood pressure and discontinue of Bevacizumab, her proteinuria is improving We will continue to monitor closely

## 2019-02-22 NOTE — Assessment & Plan Note (Signed)
She had fecal incontinence secondary to diarrhea Prior to the episode of diarrhea, she was having a lot of abdominal cramping and mild constipation We discussed the importance of being consistent with laxatives She will continue prunes daily along with daily MiraLAX She will also add some Senokot as needed to get her bowels regulated

## 2019-02-22 NOTE — Progress Notes (Signed)
[facility] OFFICE PROGRESS NOTE  Patient Care Team: Patient, No Pcp Per as PCP - General ([place]) Ileana Roup, MD as Consulting Physician (General Surgery)  ASSESSMENT & PLAN:  Fallopian tube cancer, carcinoma ([place]) I reviewed the plan of care with the patient, her son and daughter over the telephone Overall, it appears that she tolerated olaparib well I do not believe her symptoms of intermittent abdominal cramping is related to disease progression We have extensive discussion about supportive care measures I plan to see her again next week for further management of toxicity  Pancytopenia, acquired [facility]) She has stable pancytopenia We will continue to observe closely  Essential hypertension Her blood pressure is trending down I plan to start weaning her off her blood pressure medications Due to concern for dehydration, I will cut her diuretic dose a little bit She is noted to have mild bradycardia and I plan to reduce the dose of atenolol I will continue to review her blood pressure control weekly With the bevacizumab slowly out of her system, I suspect she will not need too much blood pressure medications in the future  Diarrhea She had fecal incontinence secondary to diarrhea Prior to the episode of diarrhea, she was having a lot of abdominal cramping and mild constipation We discussed the importance of being consistent with laxatives She will continue prunes daily along with daily MiraLAX She will also add some Senokot as needed to get her bowels regulated  Abdominal pain She has crampy abdominal pain and bowel habit changes which I suspect could be due to constipation She will continue to take pain medicine as needed at bedtime but we spent a lot of time today discussing the importance of aggressive laxative therapy  Other proteinuria She had proteinuria secondary to uncontrolled hypertension and bevacizumab With good control of blood  pressure and discontinue of Bevacizumab, her proteinuria is improving We will continue to monitor closely  CKD (chronic kidney disease), stage III ([place]) She has stable CKD stage III I am attempting to wean her off her diuretic therapy soon to avoid dehydration Lonie Peak can also cause elevated creatinine  Nausea & vomiting The cause is unknown.  There could be a component of gastritis We discussed taking Gaviscon or calcium carbonate She has antiemetics to take as needed   No orders of the defined types were placed in this encounter.   INTERVAL HISTORY: Please see below for problem oriented charting. She is seen in the office for further follow-up and toxicity review since starting on Lynparza Her daughter and son are also available to collaborate history over the telephone The patient was delayed because of an accident where she had nausea and vomiting and diarrhea. She has chronic nausea almost on a daily basis around 4 PM but had not had vomiting until today She has irregular bowel habits with constipation alternating with diarrhea She is afraid to take regular laxatives Her blood pressure control at home is borderline satisfactory but her blood pressure today is borderline low She also have intermittent abdominal cramping but usually is alleviated after bowel movement She takes pain medicine in the evening before bedtime so that she can sleep better and avoid abdominal pain She denies fever or chills She tolerated chemotherapy pill well The patient denies any recent signs or symptoms of bleeding such as spontaneous epistaxis, hematuria or hematochezia.   SUMMARY OF ONCOLOGIC HISTORY: Oncology History   Neg genetics.  HRD test was done on peritoneal fluid from 07/18/2018: HRD +  Fallopian tube cancer, carcinoma ([place])   06/18/2016 Tumor Marker    Patient's tumor was tested for the following markers: CA-125 Results of the tumor marker test revealed 298.    06/23/2016 PET  scan    Diffuse hypermetabolic peritoneal carcinomatosis in the abdomen and pelvis, internal mammary adenopathy in the chest, hypermetabolic right paracardiac/diaphragmatic LN, bilateral pleural effusion and moderate ascites    06/24/2016 Procedure    Therapeutic paracentesis    06/24/2016 Pathology Results    Positive for adenocarcinoma, Mullerian primary    06/28/2016 Procedure    She underwent CT guided biopsy of omentum    06/28/2016 Pathology Results    High grade serous involving the omentum, positive for p53, PAX8, WT1 and p16.    07/01/2016 Procedure    Findings/impression:   1. Sonographic evaluation of the right internal jugular vein confirms patency. Sonography was required to gain central venous access. An image documenting patency and needle access was saved.   2. Successful placement of right internal jugular vein subcutaneous Mediport as described. Spot film shows the catheter tip at the cavoatrial junction. No pneumothorax. The Mediport aspirates and flushes normally.    07/02/2016 - 08/12/2016 Chemotherapy    The patient had 3 cycles of carboplatin and taxol chemotherapy treatment.      08/29/2016 Imaging    Ct scan of chest, abdomen and pelvis showed marked improvement and resolution of thoracic adenopathy    09/08/2016 Surgery    She underwent interval debulking surgery with TAH/BSO and infracolic omentectomy by Dr. Wynelle Cleveland    09/08/2016 Pathology Results    Right tube: scant serous carcinoma. No viable tumor. Left tube and ovary: normal. Omentum: low volume serous carcinoma with few psamomma bodies.    10/07/2016 - 11/18/2016 Chemotherapy    The patient had 3 more cycles of carboplatin and taxol for chemotherapy treatment.      02/07/2017 Imaging    Impression:  1. Colonic diverticulosis without acute diverticulitis. 2. Stable calcification right kidney without hydronephrosis. 3. No acute process or significant interval change has occurred since August 29, 2016.    05/09/2018 Imaging    Impression:  Findings are most suspicious for acute sigmoid colonic diverticulitis with intraloop abscess formation.    05/14/2018 Imaging    Impression: The pelvic fluid collection contiguous with sigmoid colonic diverticulitis changes has not significant change since May 09, 2018. No new fluid collections are seen    06/28/2018 Imaging    IMPRESSION: Changes consistent with diverticulitis with free fluid within the pelvis. No focal contained abscess is seen. A previously noted fluid collection deep within the pelvis has resolved in the interval.   Stable nonobstructing right renal stone.  The remainder of the exam is stable from the prior study.     07/11/2018 PET scan    Small volume ascites, new stranding and omental nodularity    07/13/2018 Imaging    Findings are consistent with peritoneal carcinomatosis given the history of ovarian carcinoma. There is omental caking as well as stranding within various areas of the peritoneal fat. There is also wall thickening of the sigmoid colon with adjacent stranding. This finding can also be related to peritoneal carcinomatosis and implants although an inflammatory process of the sigmoid colon is not excluded.   Right nephrolithiasis.  Ileus pattern.    07/16/2018 Imaging    Impression:   Findings of peritoneal carcinomatosis, rapidly progressive. Not present in July 2019, progressed when compared to September 13.  Multiple bowel loops are distorted  but no distention or transition zone to suggest a component of active obstruction.    07/18/2018 Procedure    Findings/impression:  1. Sonographic evaluation of ascites. Sonography was required to gain access to ascites. An image documenting ascites was saved. 2. Successful ultrasound guided paracentesis using a temporary peritoneal drainage catheter inserted in the right upper quadrant as described yielding 600cc of ascites.     07/20/2018  Echocardiogram    General:  The patient was in normal sinus rhythm.  Left Ventricle:  Left ventricular ejection fraction was normal, estimated in the range of 60 to 65%. The left ventricular cavity size appears normal. The left ventricular wall thickness appears normal. The left ventricle is thickened in a fashion  consistent with mild concentric hypertrophy. Doppler tissue velocities and mitral inflow profile are consistent with Stage I diastolic dysfunction. The calculated ejection fraction is 68.1 %.  Right Ventricle:  The right ventricle appears normal in size and function.  Left Atrium:  The left atrium appears normal.  Right Atrium:  The right atrium appears normal.  Aortic Valve:  The structure of the aortic valve is tricuspid. There is evidence of trivial (trace) aortic regurgitation.  Mitral Valve:  There is no evidence of mitral regurgitation. The mitral valve appears normal in structure.  Pulmonic Valve:  There is evidence of trace (trivial) pulmonic regurgitation. The pulmonic valve appears normal in structure.  Tricuspid Valve:  There is evidence of trace (trivial) tricuspid regurgitation. The tricuspid valve appears normal in structure.  Pericardium:  There is no evidence of pericardial effusion.  Aorta:  The visualized portions of the aorta (ascending aorta, aortic root, and aortic arch) appear normal.  Pulmonic Artery:  Unable to obtain RVSP due to insufficient tricuspid regurgitation jet.  Conclusions: Left ventricular ejection fraction was normal, estimated in the range of 60 to 65%. The left ventricular cavity size appears normal. The left ventricular wall thickness appears normal. The left ventricle is thickened in a fashion consistent with mild concentric hypertrophy. Doppler tissue velocities and mitral inflow profile are consistent with Stage I diastolic dysfunction. The calculated ejection fraction is 68.1%.There is evidence of trivial (trace) aortic  regurgitation.There is evidence of trace (trivial) pulmonic regurgitation.There is evidence of trace (trivial) tricuspid regurgitation.There is no evidence of pericardial effusion.Unable to obtain RVSP due to insufficient tricuspid regurgitation jet.    07/23/2018 - 12/25/2018 Chemotherapy    The patient had carboplatin, doxil and Avastin for chemotherapy treatment x 6 cycles    08/01/2018 Imaging    Ct head showed no injuries.    08/01/2018 Procedure    Findings/impression:  1. Sonographic evaluation of ascites. Sonography was required to gain access to ascites. An image documenting ascites was saved. 2. Successful ultrasound guided paracentesis using a temporary peritoneal drainage catheter inserted in the right upper quadrant as described yielding 400cc of ascites.     08/06/2018 Tumor Marker    Patient's tumor was tested for the following markers: CA-125 Results of the tumor marker test revealed 373    08/20/2018 Tumor Marker    Patient's tumor was tested for the following markers: CA-125 Results of the tumor marker test revealed 51.    10/03/2018 Genetic Testing    Patient has genetic testing done for genetics testing using her saliva. 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  Liver and spleen homogeneously enhance. No lesion seen.  The  adjacent gallbladder is surgically absent.  The adrenal glands, kidneys, pancreas are normal.  Pelvic contents are remarkable for an absent uterus.  The mesenteric stranding has nearly completely resolved. Minimal reticular prominence throughout the pelvis.  No residual ascites.  No bowel wall thickening or. No dilated loops or transition zone.  The appendix is not seen. But no inflammatory changes in the right lower quadrant.  Osseous structures are intact.    11/27/2018 Imaging    Ct scan abdomen and pelvis showed colonic  diverticulosis and nonspecific fluid levels in the bowel    12/31/2018 Tumor Marker    Patient's tumor was tested for the following markers: CA-125 Results of the tumor marker test revealed 14.4    01/01/2019 Imaging    1. LEFT jugular Port-A-Cath catheter tip projects over the mid SVC. 2. No acute cardiopulmonary disease.     01/04/2019 Echocardiogram    1. The left ventricle has normal systolic function with an ejection fraction of 60-65%. The cavity size was normal. Left ventricular diastolic Doppler parameters are consistent with impaired relaxation.  2. The right ventricle has normal systolic function. The cavity was normal. There is no increase in right ventricular wall thickness.  3. The mitral valve is normal in structure.  4. The tricuspid valve is normal in structure.  5. The aortic valve is normal in structure.  6. The pulmonic valve was normal in structure.  7. 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     02/13/2019 -  Chemotherapy    The patient is started on Lynparza due to [place]:   Constitutional: Denies fevers, chills or abnormal weight loss Eyes: Denies blurriness of vision Ears, nose, mouth, throat, and face: Denies mucositis or sore throat Respiratory: Denies cough, dyspnea or wheezes Cardiovascular: Denies palpitation, chest discomfort or lower extremity swelling Skin: Denies abnormal skin rashes Lymphatics: Denies new lymphadenopathy or easy bruising Neurological:Denies numbness, tingling or new weaknesses Behavioral/Psych: Mood is stable, no new changes  All other systems were reviewed with the patient and are negative.  I have reviewed the past medical history, past surgical history, social history and family history with the patient and they are unchanged from previous note.  ALLERGIES:  is allergic to latex.  MEDICATIONS:  Current Outpatient Medications  Medication Sig Dispense Refill  . Acetaminophen 325 MG  CAPS Take 650 mg by mouth every 6 (six) hours as needed.    Marland Kitchen amLODipine (NORVASC) 10 MG tablet Take 10 mg by mouth daily.    . Ascorbic Acid (VITAMIN C PO) Take 500 mg by mouth daily.     Marland Kitchen atenolol (TENORMIN) 25 MG tablet Take 0.5 tablets (12.5 mg total) by mouth daily. 30 tablet 11  . bevacizumab-awwb in sodium chloride 0.9 % 100 mL Inject into the vein once.    . Cholecalciferol (VITAMIN D PO) Take 1 tablet by mouth daily. D3 1000units    . escitalopram (LEXAPRO) 10 MG tablet Take 1 tablet (10 mg total) by mouth daily. 90 tablet 1  . fluticasone (FLONASE) 50 MCG/ACT nasal spray Place 1 spray into both nostrils daily.    . hydrALAZINE (APRESOLINE) 50 MG tablet Take 1 tablet (50 mg total) by mouth 3 (three) times daily. 90 tablet 1  . hydrochlorothiazide (HYDRODIURIL) 25 MG tablet Take 0.5 tablets (12.5 mg total) by mouth daily. 30 tablet 3  . losartan (COZAAR) 100 MG tablet Take 100 mg by mouth daily.    Marland Kitchen  Multiple Vitamin (MULTIVITAMIN) tablet Take 1 tablet by mouth daily.    Marland Kitchen olaparib (LYNPARZA) 100 MG tablet Take 2 tablets (200 mg total) by mouth 2 (two) times daily. Swallow whole. May take with food to decrease nausea and vomiting. 120 tablet 11  . ondansetron (ZOFRAN) 8 MG tablet Take 8 mg by mouth every 8 (eight) hours as needed for nausea or vomiting.    Marland Kitchen oxyCODONE-acetaminophen (PERCOCET/ROXICET) 5-325 MG tablet Take by mouth every 4 (four) hours as needed for severe pain.    . polyethylene glycol (MIRALAX / GLYCOLAX) 17 g packet Take 17 g by mouth 2 (two) times daily.     No current facility-administered medications for this visit.     PHYSICAL EXAMINATION: ECOG PERFORMANCE STATUS: 2 - Symptomatic, <50% confined to bed  Vitals:   02/21/19 0935  BP: (!) 127/57  Pulse: (!) 59  Resp: 17  Temp: 98.4 F (36.9 C)  SpO2: 95%   There were no vitals filed for this visit.  GENERAL:alert, no distress and comfortable SKIN: skin color, texture, turgor are normal, no rashes or  significant lesions EYES: normal, Conjunctiva are pink and non-injected, sclera clear OROPHARYNX:no exudate, no erythema and lips, buccal mucosa, and tongue normal  NECK: supple, thyroid normal size, non-tender, without nodularity LYMPH:  no palpable lymphadenopathy in the cervical, axillary or inguinal LUNGS: clear to auscultation and percussion with normal breathing effort HEART: regular rate & rhythm and no murmurs and no lower extremity edema ABDOMEN:abdomen soft, non-tender and normal bowel sounds Musculoskeletal:no cyanosis of digits and no clubbing  NEURO: alert & oriented x 3 with fluent speech, no focal motor/sensory deficits  LABORATORY DATA:  I have reviewed the data as listed    Component Value Date/Time   NA 134 (L) 02/21/2019 0847   NA 139 06/27/2018 1649   K 4.1 02/21/2019 0847   CL 104 02/21/2019 0847   CO2 19 (L) 02/21/2019 0847   GLUCOSE 125 (H) 02/21/2019 0847   BUN 30 (H) 02/21/2019 0847   BUN 17 06/27/2018 1649   CREATININE 1.33 (H) 02/21/2019 0847   CALCIUM 9.3 02/21/2019 0847   PROT 7.6 02/21/2019 0847   PROT 6.8 05/24/2018 0851   ALBUMIN 3.9 02/21/2019 0847   ALBUMIN 4.2 05/24/2018 0851   AST 18 02/21/2019 0847   ALT 10 02/21/2019 0847   ALKPHOS 60 02/21/2019 0847   BILITOT 0.3 02/21/2019 0847   BILITOT 0.2 05/24/2018 0851   GFRNONAA 37 (L) 02/21/2019 0847   GFRAA 43 (L) 02/21/2019 0847    No results found for: SPEP, UPEP  Lab Results  Component Value Date   WBC 6.8 02/21/2019   NEUTROABS 4.2 02/21/2019   HGB 10.7 (L) 02/21/2019   HCT 31.6 (L) 02/21/2019   MCV 106.0 (H) 02/21/2019   PLT 143 (L) 02/21/2019      Chemistry      Component Value Date/Time   NA 134 (L) 02/21/2019 0847   NA 139 06/27/2018 1649   K 4.1 02/21/2019 0847   CL 104 02/21/2019 0847   CO2 19 (L) 02/21/2019 0847   BUN 30 (H) 02/21/2019 0847   BUN 17 06/27/2018 1649   CREATININE 1.33 (H) 02/21/2019 0847      Component Value Date/Time   CALCIUM 9.3 02/21/2019 0847    ALKPHOS 60 02/21/2019 0847   AST 18 02/21/2019 0847   ALT 10 02/21/2019 0847   BILITOT 0.3 02/21/2019 0847   BILITOT 0.2 05/24/2018 0851       All  questions were answered. The patient knows to call the clinic with any problems, questions or concerns. No barriers to learning was detected.  I spent 40 minutes counseling the patient face to face. The total time spent in the appointment was 55 minutes and more than 50% was on counseling and review of test results  Heath Lark, MD 02/22/2019 9:48 AM

## 2019-02-22 NOTE — Telephone Encounter (Signed)
Called and left below message. Ask him to call the office for questions. 

## 2019-02-22 NOTE — Assessment & Plan Note (Signed)
I reviewed the plan of care with the patient, her son and daughter over the telephone Overall, it appears that she tolerated olaparib well I do not believe her symptoms of intermittent abdominal cramping is related to disease progression We have extensive discussion about supportive care measures I plan to see her again next week for further management of toxicity

## 2019-02-22 NOTE — Assessment & Plan Note (Signed)
The cause is unknown.  There could be a component of gastritis We discussed taking Gaviscon or calcium carbonate She has antiemetics to take as needed

## 2019-02-22 NOTE — Assessment & Plan Note (Signed)
She has crampy abdominal pain and bowel habit changes which I suspect could be due to constipation She will continue to take pain medicine as needed at bedtime but we spent a lot of time today discussing the importance of aggressive laxative therapy

## 2019-02-22 NOTE — Assessment & Plan Note (Signed)
Her blood pressure is trending down I plan to start weaning her off her blood pressure medications Due to concern for dehydration, I will cut her diuretic dose a little bit She is noted to have mild bradycardia and I plan to reduce the dose of atenolol I will continue to review her blood pressure control weekly With the bevacizumab slowly out of her system, I suspect she will not need too much blood pressure medications in the future

## 2019-02-22 NOTE — Assessment & Plan Note (Signed)
She has stable CKD stage III I am attempting to wean her off her diuretic therapy soon to avoid dehydration Lonie Peak can also cause elevated creatinine

## 2019-02-22 NOTE — Telephone Encounter (Signed)
-----   Message from Heath Lark, MD sent at 02/22/2019  7:38 AM EDT ----- Regarding: CA-125 is stable Pls let her son, [name] know  Even though the number looks a bit higher it is still within normal limits ----- Message ----- From: Buel Ream, [facility] [place] Sent: 02/21/2019   9:13 AM EDT To: Heath Lark, MD

## 2019-02-28 ENCOUNTER — Other Ambulatory Visit: Payer: Self-pay

## 2019-02-28 ENCOUNTER — Inpatient Hospital Stay: Payer: Medicare Other

## 2019-02-28 ENCOUNTER — Inpatient Hospital Stay (HOSPITAL_BASED_OUTPATIENT_CLINIC_OR_DEPARTMENT_OTHER): Payer: Medicare Other | Admitting: Hematology and Oncology

## 2019-02-28 ENCOUNTER — Encounter: Payer: Self-pay | Admitting: Hematology and Oncology

## 2019-02-28 DIAGNOSIS — D539 Nutritional anemia, unspecified: Secondary | ICD-10-CM

## 2019-02-28 DIAGNOSIS — R809 Proteinuria, unspecified: Secondary | ICD-10-CM

## 2019-02-28 DIAGNOSIS — N183 Chronic kidney disease, stage 3 unspecified: Secondary | ICD-10-CM

## 2019-02-28 DIAGNOSIS — D61818 Other pancytopenia: Secondary | ICD-10-CM

## 2019-02-28 DIAGNOSIS — R112 Nausea with vomiting, unspecified: Secondary | ICD-10-CM

## 2019-02-28 DIAGNOSIS — C5700 Malignant neoplasm of unspecified fallopian tube: Secondary | ICD-10-CM | POA: Diagnosis not present

## 2019-02-28 DIAGNOSIS — I129 Hypertensive chronic kidney disease with stage 1 through stage 4 chronic kidney disease, or unspecified chronic kidney disease: Secondary | ICD-10-CM

## 2019-02-28 DIAGNOSIS — C786 Secondary malignant neoplasm of retroperitoneum and peritoneum: Secondary | ICD-10-CM

## 2019-02-28 DIAGNOSIS — R1030 Lower abdominal pain, unspecified: Secondary | ICD-10-CM

## 2019-02-28 DIAGNOSIS — T451X5A Adverse effect of antineoplastic and immunosuppressive drugs, initial encounter: Secondary | ICD-10-CM

## 2019-02-28 DIAGNOSIS — D6481 Anemia due to antineoplastic chemotherapy: Secondary | ICD-10-CM

## 2019-02-28 DIAGNOSIS — I10 Essential (primary) hypertension: Secondary | ICD-10-CM

## 2019-02-28 LAB — CBC WITH DIFFERENTIAL/PLATELET
Abs Immature Granulocytes: 0.02 10*3/uL (ref 0.00–0.07)
Basophils Absolute: 0 10*3/uL (ref 0.0–0.1)
Basophils Relative: 1 %
Eosinophils Absolute: 0.1 10*3/uL (ref 0.0–0.5)
Eosinophils Relative: 2 %
HCT: 30.6 % — ABNORMAL LOW (ref 36.0–46.0)
Hemoglobin: 10.3 g/dL — ABNORMAL LOW (ref 12.0–15.0)
Immature Granulocytes: 0 %
Lymphocytes Relative: 29 %
Lymphs Abs: 1.4 10*3/uL (ref 0.7–4.0)
MCH: 36.9 pg — ABNORMAL HIGH (ref 26.0–34.0)
MCHC: 33.7 g/dL (ref 30.0–36.0)
MCV: 109.7 fL — ABNORMAL HIGH (ref 80.0–100.0)
Monocytes Absolute: 0.4 10*3/uL (ref 0.1–1.0)
Monocytes Relative: 9 %
Neutro Abs: 2.9 10*3/uL (ref 1.7–7.7)
Neutrophils Relative %: 59 %
Platelets: 94 10*3/uL — ABNORMAL LOW (ref 150–400)
RBC: 2.79 MIL/uL — ABNORMAL LOW (ref 3.87–5.11)
RDW: 14.6 % (ref 11.5–15.5)
WBC: 4.9 10*3/uL (ref 4.0–10.5)
nRBC: 0 % (ref 0.0–0.2)

## 2019-02-28 LAB — COMPREHENSIVE METABOLIC PANEL
ALT: 13 U/L (ref 0–44)
AST: 21 U/L (ref 15–41)
Albumin: 3.8 g/dL (ref 3.5–5.0)
Alkaline Phosphatase: 60 U/L (ref 38–126)
Anion gap: 11 (ref 5–15)
BUN: 27 mg/dL — ABNORMAL HIGH (ref 8–23)
CO2: 21 mmol/L — ABNORMAL LOW (ref 22–32)
Calcium: 9 mg/dL (ref 8.9–10.3)
Chloride: 105 mmol/L (ref 98–111)
Creatinine, Ser: 1.17 mg/dL — ABNORMAL HIGH (ref 0.44–1.00)
GFR calc Af Amer: 50 mL/min — ABNORMAL LOW (ref >60)
GFR calc non Af Amer: 43 mL/min — ABNORMAL LOW (ref >60)
Glucose, Bld: 115 mg/dL — ABNORMAL HIGH (ref 70–99)
Potassium: 4.3 mmol/L (ref 3.5–5.1)
Sodium: 137 mmol/L (ref 135–145)
Total Bilirubin: 0.3 mg/dL (ref 0.3–1.2)
Total Protein: 7.2 g/dL (ref 6.5–8.1)

## 2019-02-28 LAB — TOTAL PROTEIN, URINE DIPSTICK: Protein, ur: 100 mg/dL — AB

## 2019-02-28 MED ORDER — OLAPARIB 100 MG PO TABS: 100.0000 mg | ORAL_TABLET | Freq: Two times a day (BID) | ORAL | 11 refills | Status: DC

## 2019-02-28 NOTE — Assessment & Plan Note (Signed)
Her blood pressure remain elevated but occasionally, her blood pressure will drop to normal range For now, I do not plan to adjust her blood pressure medication as I anticipate he will continue to improve with time away from her last treatment of bevacizumab

## 2019-02-28 NOTE — Assessment & Plan Note (Signed)
She continues to have intermittent abdominal pain, well controlled with aggressive laxative therapy and intermittent doses of pain medicine.  We will continue the same

## 2019-02-28 NOTE — Progress Notes (Signed)
[facility] OFFICE PROGRESS NOTE  Patient Care Team: Patient, No Pcp Per as PCP - General ([facility]) Ileana Roup, MD as Consulting Physician (General Surgery)  ASSESSMENT & PLAN:  Fallopian tube cancer, carcinoma ([place]) She tolerated treatment poorly with progressive pancytopenia She continues to have heavy proteinuria from recent bevacizumab I recommend reduced dose olaparib to 100 mg twice a day and reassess next week  Pancytopenia, acquired ([place]) She has progressive pancytopenia due to side effects of treatment I plan to reduce olaparib to 100 mg twice a day and we will monitor her blood count closely next week  CKD (chronic kidney disease), stage III ([place]) She has stable CKD stage III I am attempting to wean her off her diuretic therapy soon to avoid dehydration  Abdominal pain She continues to have intermittent abdominal pain, well controlled with aggressive laxative therapy and intermittent doses of pain medicine.  We will continue the same  Essential hypertension Her blood pressure remain elevated but occasionally, her blood pressure will drop to normal range For now, I do not plan to adjust her blood pressure medication as I anticipate he will continue to improve with time away from her last treatment of bevacizumab  Nausea & vomiting She continues to have intermittent nausea and vomiting She vomited again in the office today I recommend antiemetics before her next visit.   No orders of the defined types were placed in this encounter.   INTERVAL HISTORY: Please see below for problem oriented charting. She returns for further follow-up Her daughter and son are available to collaborate history over the telephone She vomited in my office within a few minutes of the conversation and felt better She ate some food at home before she came in but felt that the trip might have aggravated her stomach She denies constipation Her abdominal pain  overall has improved She has gained some weight She denies dizziness or headache Her blood pressure control over the last week was documented and reviewed Occasionally, her blood pressure will drop within normal range although consistently are within systolic blood pressure of 150 She denies leg edema Calcium carbonate was not helpful to alleviate her stomach upset symptom The patient denies any recent signs or symptoms of bleeding such as spontaneous epistaxis, hematuria or hematochezia. She denies recent fever or chills  SUMMARY OF ONCOLOGIC HISTORY: Oncology History   Neg genetics.  HRD test was done on peritoneal fluid from 07/18/2018: HRD +     Fallopian tube cancer, carcinoma ([place])   06/18/2016 Tumor Marker    Patient's tumor was tested for the following markers: CA-125 Results of the tumor marker test revealed 298.    06/23/2016 PET scan    Diffuse hypermetabolic peritoneal carcinomatosis in the abdomen and pelvis, internal mammary adenopathy in the chest, hypermetabolic right paracardiac/diaphragmatic LN, bilateral pleural effusion and moderate ascites    06/24/2016 Procedure    Therapeutic paracentesis    06/24/2016 Pathology Results    Positive for adenocarcinoma, Mullerian primary    06/28/2016 Procedure    She underwent CT guided biopsy of omentum    06/28/2016 Pathology Results    High grade serous involving the omentum, positive for p53, PAX8, WT1 and p16.    07/01/2016 Procedure    Findings/impression:   1. Sonographic evaluation of the right internal jugular vein confirms patency. Sonography was required to gain central venous access. An image documenting patency and needle access was saved.   2. Successful placement of right internal jugular vein  subcutaneous Mediport as described. Spot film shows the catheter tip at the cavoatrial junction. No pneumothorax. The Mediport aspirates and flushes normally.    07/02/2016 - 08/12/2016 Chemotherapy    The patient had 3  cycles of carboplatin and taxol chemotherapy treatment.      08/29/2016 Imaging    Ct scan of chest, abdomen and pelvis showed marked improvement and resolution of thoracic adenopathy    09/08/2016 Surgery    She underwent interval debulking surgery with TAH/BSO and infracolic omentectomy by Dr. Wynelle Cleveland    09/08/2016 Pathology Results    Right tube: scant serous carcinoma. No viable tumor. Left tube and ovary: normal. Omentum: low volume serous carcinoma with few psamomma bodies.    10/07/2016 - 11/18/2016 Chemotherapy    The patient had 3 more cycles of carboplatin and taxol for chemotherapy treatment.      02/07/2017 Imaging    Impression:  1. Colonic diverticulosis without acute diverticulitis. 2. Stable calcification right kidney without hydronephrosis. 3. No acute process or significant interval change has occurred since August 29, 2016.    05/09/2018 Imaging    Impression:  Findings are most suspicious for acute sigmoid colonic diverticulitis with intraloop abscess formation.    05/14/2018 Imaging    Impression: The pelvic fluid collection contiguous with sigmoid colonic diverticulitis changes has not significant change since May 09, 2018. No new fluid collections are seen    06/28/2018 Imaging    IMPRESSION: Changes consistent with diverticulitis with free fluid within the pelvis. No focal contained abscess is seen. A previously noted fluid collection deep within the pelvis has resolved in the interval.   Stable nonobstructing right renal stone.  The remainder of the exam is stable from the prior study.     07/11/2018 PET scan    Small volume ascites, new stranding and omental nodularity    07/13/2018 Imaging    Findings are consistent with peritoneal carcinomatosis given the history of ovarian carcinoma. There is omental caking as well as stranding within various areas of the peritoneal fat. There is also wall thickening of the sigmoid colon with adjacent  stranding. This finding can also be related to peritoneal carcinomatosis and implants although an inflammatory process of the sigmoid colon is not excluded.   Right nephrolithiasis.  Ileus pattern.    07/16/2018 Imaging    Impression:   Findings of peritoneal carcinomatosis, rapidly progressive. Not present in July 2019, progressed when compared to September 13.  Multiple bowel loops are distorted but no distention or transition zone to suggest a component of active obstruction.    07/18/2018 Procedure    Findings/impression:  1. Sonographic evaluation of ascites. Sonography was required to gain access to ascites. An image documenting ascites was saved. 2. Successful ultrasound guided paracentesis using a temporary peritoneal drainage catheter inserted in the right upper quadrant as described yielding 600cc of ascites.     07/20/2018 Echocardiogram    General:  The patient was in normal sinus rhythm.  Left Ventricle:  Left ventricular ejection fraction was normal, estimated in the range of 60 to 65%. The left ventricular cavity size appears normal. The left ventricular wall thickness appears normal. The left ventricle is thickened in a fashion  consistent with mild concentric hypertrophy. Doppler tissue velocities and mitral inflow profile are consistent with Stage I diastolic dysfunction. The calculated ejection fraction is 68.1 %.  Right Ventricle:  The right ventricle appears normal in size and function.  Left Atrium:  The left atrium appears normal.  Right  Atrium:  The right atrium appears normal.  Aortic Valve:  The structure of the aortic valve is tricuspid. There is evidence of trivial (trace) aortic regurgitation.  Mitral Valve:  There is no evidence of mitral regurgitation. The mitral valve appears normal in structure.  Pulmonic Valve:  There is evidence of trace (trivial) pulmonic regurgitation. The pulmonic valve appears normal in structure.  Tricuspid Valve:  There  is evidence of trace (trivial) tricuspid regurgitation. The tricuspid valve appears normal in structure.  Pericardium:  There is no evidence of pericardial effusion.  Aorta:  The visualized portions of the aorta (ascending aorta, aortic root, and aortic arch) appear normal.  Pulmonic Artery:  Unable to obtain RVSP due to insufficient tricuspid regurgitation jet.  Conclusions: Left ventricular ejection fraction was normal, estimated in the range of 60 to 65%. The left ventricular cavity size appears normal. The left ventricular wall thickness appears normal. The left ventricle is thickened in a fashion consistent with mild concentric hypertrophy. Doppler tissue velocities and mitral inflow profile are consistent with Stage I diastolic dysfunction. The calculated ejection fraction is 68.1%.There is evidence of trivial (trace) aortic regurgitation.There is evidence of trace (trivial) pulmonic regurgitation.There is evidence of trace (trivial) tricuspid regurgitation.There is no evidence of pericardial effusion.Unable to obtain RVSP due to insufficient tricuspid regurgitation jet.    07/23/2018 - 12/25/2018 Chemotherapy    The patient had carboplatin, doxil and Avastin for chemotherapy treatment x 6 cycles    08/01/2018 Imaging    Ct head showed no injuries.    08/01/2018 Procedure    Findings/impression:  1. Sonographic evaluation of ascites. Sonography was required to gain access to ascites. An image documenting ascites was saved. 2. Successful ultrasound guided paracentesis using a temporary peritoneal drainage catheter inserted in the right upper quadrant as described yielding 400cc of ascites.     08/06/2018 Tumor Marker    Patient's tumor was tested for the following markers: CA-125 Results of the tumor marker test revealed 373    08/20/2018 Tumor Marker    Patient's tumor was tested for the following markers: CA-125 Results of the tumor marker test revealed 51.    10/03/2018 Genetic  Testing    Patient has genetic testing done for genetics testing using her saliva. 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  Liver and spleen homogeneously enhance. No lesion seen.  The adjacent gallbladder is surgically absent.  The adrenal glands, kidneys, pancreas are normal.  Pelvic contents are remarkable for an absent uterus.  The mesenteric stranding has nearly completely resolved. Minimal reticular prominence throughout the pelvis.  No residual ascites.  No bowel wall thickening or. No dilated loops or transition zone.  The appendix is not seen. But no inflammatory changes in the right lower quadrant.  Osseous structures are intact.    11/27/2018 Imaging    Ct scan abdomen and pelvis showed colonic diverticulosis and nonspecific fluid levels in the bowel    12/31/2018 Tumor Marker    Patient's tumor was tested for the following markers: CA-125 Results of the tumor marker test revealed 14.4    01/01/2019 Imaging    1. LEFT jugular Port-A-Cath catheter tip projects over the mid SVC. 2. No acute cardiopulmonary disease.     01/04/2019 Echocardiogram    1. The left ventricle has normal systolic function with an ejection fraction of 60-65%. The cavity size was normal. Left  ventricular diastolic Doppler parameters are consistent with impaired relaxation.  2. The right ventricle has normal systolic function. The cavity was normal. There is no increase in right ventricular wall thickness.  3. The mitral valve is normal in structure.  4. The tricuspid valve is normal in structure.  5. The aortic valve is normal in structure.  6. The pulmonic valve was normal in structure.  7. 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     02/13/2019 -  Chemotherapy    The patient is started on Lynparza due to [place]:   Constitutional: Denies  fevers, chills or abnormal weight loss Eyes: Denies blurriness of vision Ears, nose, mouth, throat, and face: Denies mucositis or sore throat Respiratory: Denies cough, dyspnea or wheezes Cardiovascular: Denies palpitation, chest discomfort or lower extremity swelling Skin: Denies abnormal skin rashes Lymphatics: Denies new lymphadenopathy or easy bruising Neurological:Denies numbness, tingling or new weaknesses Behavioral/Psych: Mood is stable, no new changes  All other systems were reviewed with the patient and are negative.  I have reviewed the past medical history, past surgical history, social history and family history with the patient and they are unchanged from previous note.  ALLERGIES:  is allergic to latex.  MEDICATIONS:  Current Outpatient Medications  Medication Sig Dispense Refill  . Acetaminophen 325 MG CAPS Take 650 mg by mouth every 6 (six) hours as needed.    Marland Kitchen amLODipine (NORVASC) 10 MG tablet Take 10 mg by mouth daily.    . Ascorbic Acid (VITAMIN C PO) Take 500 mg by mouth daily.     Marland Kitchen atenolol (TENORMIN) 25 MG tablet Take 0.5 tablets (12.5 mg total) by mouth daily. 30 tablet 11  . Cholecalciferol (VITAMIN D PO) Take 1 tablet by mouth daily. D3 1000units    . escitalopram (LEXAPRO) 10 MG tablet Take 1 tablet (10 mg total) by mouth daily. 90 tablet 1  . fluticasone (FLONASE) 50 MCG/ACT nasal spray Place 1 spray into both nostrils daily.    . hydrALAZINE (APRESOLINE) 50 MG tablet Take 1 tablet (50 mg total) by mouth 3 (three) times daily. 90 tablet 1  . hydrochlorothiazide (HYDRODIURIL) 25 MG tablet Take 0.5 tablets (12.5 mg total) by mouth daily. 30 tablet 3  . losartan (COZAAR) 100 MG tablet Take 100 mg by mouth daily.    . Multiple Vitamin (MULTIVITAMIN) tablet Take 1 tablet by mouth daily.    Marland Kitchen olaparib (LYNPARZA) 100 MG tablet Take 1 tablet (100 mg total) by mouth 2 (two) times daily. Swallow whole. May take with food to decrease nausea and vomiting. 60 tablet 11   . ondansetron (ZOFRAN) 8 MG tablet Take 8 mg by mouth every 8 (eight) hours as needed for nausea or vomiting.    Marland Kitchen oxyCODONE-acetaminophen (PERCOCET/ROXICET) 5-325 MG tablet Take by mouth every 4 (four) hours as needed for severe pain.    . polyethylene glycol (MIRALAX / GLYCOLAX) 17 g packet Take 17 g by mouth 2 (two) times daily.     No current facility-administered medications for this visit.     PHYSICAL EXAMINATION: ECOG PERFORMANCE STATUS: 2 - Symptomatic, <50% confined to bed  Vitals:   02/28/19 1130  BP: (!) 155/71  Pulse: 63  Resp: 18  Temp: 98.1 F (36.7 C)  SpO2: 98%   Filed Weights   02/28/19 1130  Weight: 147 lb 11.2 oz (67 kg)    GENERAL:alert, no distress and comfortable SKIN: skin color, texture, turgor  are normal, no rashes or significant lesions EYES: normal, Conjunctiva are pink and non-injected, sclera clear OROPHARYNX:no exudate, no erythema and lips, buccal mucosa, and tongue normal  NECK: supple, thyroid normal size, non-tender, without nodularity LYMPH:  no palpable lymphadenopathy in the cervical, axillary or inguinal LUNGS: clear to auscultation and percussion with normal breathing effort HEART: regular rate & rhythm and no murmurs and no lower extremity edema ABDOMEN:abdomen soft, mild tenderness in the epigastrium without rebound or guarding Musculoskeletal:no cyanosis of digits and no clubbing  NEURO: alert & oriented x 3 with fluent speech, no focal motor/sensory deficits  LABORATORY DATA:  I have reviewed the data as listed    Component Value Date/Time   NA 137 02/28/2019 1114   NA 139 06/27/2018 1649   K 4.3 02/28/2019 1114   CL 105 02/28/2019 1114   CO2 21 (L) 02/28/2019 1114   GLUCOSE 115 (H) 02/28/2019 1114   BUN 27 (H) 02/28/2019 1114   BUN 17 06/27/2018 1649   CREATININE 1.17 (H) 02/28/2019 1114   CALCIUM 9.0 02/28/2019 1114   PROT 7.2 02/28/2019 1114   PROT 6.8 05/24/2018 0851   ALBUMIN 3.8 02/28/2019 1114   ALBUMIN 4.2  05/24/2018 0851   AST 21 02/28/2019 1114   ALT 13 02/28/2019 1114   ALKPHOS 60 02/28/2019 1114   BILITOT 0.3 02/28/2019 1114   BILITOT 0.2 05/24/2018 0851   GFRNONAA 43 (L) 02/28/2019 1114   GFRAA 50 (L) 02/28/2019 1114    No results found for: SPEP, UPEP  Lab Results  Component Value Date   WBC 4.9 02/28/2019   NEUTROABS 2.9 02/28/2019   HGB 10.3 (L) 02/28/2019   HCT 30.6 (L) 02/28/2019   MCV 109.7 (H) 02/28/2019   PLT 94 (L) 02/28/2019      Chemistry      Component Value Date/Time   NA 137 02/28/2019 1114   NA 139 06/27/2018 1649   K 4.3 02/28/2019 1114   CL 105 02/28/2019 1114   CO2 21 (L) 02/28/2019 1114   BUN 27 (H) 02/28/2019 1114   BUN 17 06/27/2018 1649   CREATININE 1.17 (H) 02/28/2019 1114      Component Value Date/Time   CALCIUM 9.0 02/28/2019 1114   ALKPHOS 60 02/28/2019 1114   AST 21 02/28/2019 1114   ALT 13 02/28/2019 1114   BILITOT 0.3 02/28/2019 1114   BILITOT 0.2 05/24/2018 0851       All questions were answered. The patient knows to call the clinic with any problems, questions or concerns. No barriers to learning was detected.  I spent 25 minutes counseling the patient face to face. The total time spent in the appointment was 30 minutes and more than 50% was on counseling and review of test results  Heath Lark, MD 02/28/2019 1:09 PM

## 2019-02-28 NOTE — Assessment & Plan Note (Signed)
She tolerated treatment poorly with progressive pancytopenia She continues to have heavy proteinuria from recent bevacizumab I recommend reduced dose olaparib to 100 mg twice a day and reassess next week

## 2019-02-28 NOTE — Assessment & Plan Note (Addendum)
She has progressive pancytopenia due to side effects of treatment I plan to reduce olaparib to 100 mg twice a day and we will monitor her blood count closely next week

## 2019-02-28 NOTE — Assessment & Plan Note (Signed)
She has stable CKD stage III I am attempting to wean her off her diuretic therapy soon to avoid dehydration

## 2019-02-28 NOTE — Assessment & Plan Note (Signed)
She continues to have intermittent nausea and vomiting She vomited again in the office today I recommend antiemetics before her next visit.

## 2019-03-01 ENCOUNTER — Telehealth: Payer: Self-pay | Admitting: Hematology and Oncology

## 2019-03-01 NOTE — Telephone Encounter (Signed)
Called regarding schedule °

## 2019-03-05 ENCOUNTER — Telehealth: Payer: Self-pay

## 2019-03-05 NOTE — Telephone Encounter (Signed)
Oral Oncology Patient Advocate Encounter  Was successful in securing patient a $3500 grant from [employer] to provide copayment coverage for [employer]. This will keep the out of pocket expense at $0.   I have spoken with the patients son, [name].  The billing information is as follows and has been shared with [place].   Member ID: [id number] Group ID: [id number] RxBin: [id number] Dates of Eligibility: 02/03/19 through 02/02/20  [place] [facility] [place] Phone [phone] Fax [phone] 03/05/2019    10:15 AM

## 2019-03-07 ENCOUNTER — Inpatient Hospital Stay: Payer: Medicare Other | Attending: Hematology and Oncology

## 2019-03-07 ENCOUNTER — Telehealth: Payer: Self-pay | Admitting: Hematology and Oncology

## 2019-03-07 ENCOUNTER — Inpatient Hospital Stay (HOSPITAL_BASED_OUTPATIENT_CLINIC_OR_DEPARTMENT_OTHER): Payer: Medicare Other | Admitting: Hematology and Oncology

## 2019-03-07 ENCOUNTER — Other Ambulatory Visit: Payer: Self-pay

## 2019-03-07 ENCOUNTER — Encounter: Payer: Self-pay | Admitting: Hematology and Oncology

## 2019-03-07 DIAGNOSIS — D539 Nutritional anemia, unspecified: Secondary | ICD-10-CM

## 2019-03-07 DIAGNOSIS — C5701 Malignant neoplasm of right fallopian tube: Secondary | ICD-10-CM | POA: Insufficient documentation

## 2019-03-07 DIAGNOSIS — D61818 Other pancytopenia: Secondary | ICD-10-CM

## 2019-03-07 DIAGNOSIS — R112 Nausea with vomiting, unspecified: Secondary | ICD-10-CM

## 2019-03-07 DIAGNOSIS — Z79899 Other long term (current) drug therapy: Secondary | ICD-10-CM | POA: Diagnosis not present

## 2019-03-07 DIAGNOSIS — Z9221 Personal history of antineoplastic chemotherapy: Secondary | ICD-10-CM

## 2019-03-07 DIAGNOSIS — D6481 Anemia due to antineoplastic chemotherapy: Secondary | ICD-10-CM

## 2019-03-07 DIAGNOSIS — N183 Chronic kidney disease, stage 3 unspecified: Secondary | ICD-10-CM

## 2019-03-07 DIAGNOSIS — I10 Essential (primary) hypertension: Secondary | ICD-10-CM

## 2019-03-07 DIAGNOSIS — C5700 Malignant neoplasm of unspecified fallopian tube: Secondary | ICD-10-CM

## 2019-03-07 DIAGNOSIS — C786 Secondary malignant neoplasm of retroperitoneum and peritoneum: Secondary | ICD-10-CM | POA: Diagnosis not present

## 2019-03-07 DIAGNOSIS — R5383 Other fatigue: Secondary | ICD-10-CM

## 2019-03-07 DIAGNOSIS — T451X5A Adverse effect of antineoplastic and immunosuppressive drugs, initial encounter: Principal | ICD-10-CM

## 2019-03-07 DIAGNOSIS — R1030 Lower abdominal pain, unspecified: Secondary | ICD-10-CM

## 2019-03-07 LAB — COMPREHENSIVE METABOLIC PANEL
ALT: 14 U/L (ref 0–44)
AST: 20 U/L (ref 15–41)
Albumin: 3.9 g/dL (ref 3.5–5.0)
Alkaline Phosphatase: 62 U/L (ref 38–126)
Anion gap: 9 (ref 5–15)
BUN: 28 mg/dL — ABNORMAL HIGH (ref 8–23)
CO2: 23 mmol/L (ref 22–32)
Calcium: 9.3 mg/dL (ref 8.9–10.3)
Chloride: 106 mmol/L (ref 98–111)
Creatinine, Ser: 1.17 mg/dL — ABNORMAL HIGH (ref 0.44–1.00)
GFR calc Af Amer: 50 mL/min — ABNORMAL LOW (ref >60)
GFR calc non Af Amer: 43 mL/min — ABNORMAL LOW (ref >60)
Glucose, Bld: 98 mg/dL (ref 70–99)
Potassium: 4.4 mmol/L (ref 3.5–5.1)
Sodium: 138 mmol/L (ref 135–145)
Total Bilirubin: 0.2 mg/dL — ABNORMAL LOW (ref 0.3–1.2)
Total Protein: 7.4 g/dL (ref 6.5–8.1)

## 2019-03-07 LAB — CBC WITH DIFFERENTIAL/PLATELET
Abs Immature Granulocytes: 0.01 10*3/uL (ref 0.00–0.07)
Basophils Absolute: 0 10*3/uL (ref 0.0–0.1)
Basophils Relative: 1 %
Eosinophils Absolute: 0.1 10*3/uL (ref 0.0–0.5)
Eosinophils Relative: 2 %
HCT: 31.5 % — ABNORMAL LOW (ref 36.0–46.0)
Hemoglobin: 10.4 g/dL — ABNORMAL LOW (ref 12.0–15.0)
Immature Granulocytes: 0 %
Lymphocytes Relative: 32 %
Lymphs Abs: 1.7 10*3/uL (ref 0.7–4.0)
MCH: 36.5 pg — ABNORMAL HIGH (ref 26.0–34.0)
MCHC: 33 g/dL (ref 30.0–36.0)
MCV: 110.5 fL — ABNORMAL HIGH (ref 80.0–100.0)
Monocytes Absolute: 0.5 10*3/uL (ref 0.1–1.0)
Monocytes Relative: 9 %
Neutro Abs: 3 10*3/uL (ref 1.7–7.7)
Neutrophils Relative %: 56 %
Platelets: 118 10*3/uL — ABNORMAL LOW (ref 150–400)
RBC: 2.85 MIL/uL — ABNORMAL LOW (ref 3.87–5.11)
RDW: 14.6 % (ref 11.5–15.5)
WBC: 5.3 10*3/uL (ref 4.0–10.5)
nRBC: 0 % (ref 0.0–0.2)

## 2019-03-07 LAB — TOTAL PROTEIN, URINE DIPSTICK: Protein, ur: 100 mg/dL — AB

## 2019-03-07 MED ORDER — LOSARTAN POTASSIUM 100 MG PO TABS: 100.0000 mg | ORAL_TABLET | Freq: Every day | ORAL | 0 refills | Status: DC

## 2019-03-07 NOTE — Telephone Encounter (Signed)
Scheduled appt per 5/7 sch message - pt son is aware of appt for 5/14

## 2019-03-07 NOTE — Assessment & Plan Note (Addendum)
She had significant malaise and fatigue despite improvement of her blood counts I do not have a good explanation for this Her last TSH was mildly elevated I plan to recheck TSH level again in her next visit

## 2019-03-07 NOTE — Assessment & Plan Note (Signed)
She tolerated treatment better with reduced dose [place] She continues to have heavy proteinuria from recent bevacizumab I will see her again next week for further follow-up If her blood counts are stable and symptomatically improved, I will lengthen her visit to every 2 weeks

## 2019-03-07 NOTE — Assessment & Plan Note (Signed)
Her blood pressure control is suboptimal but much improved compared to before She still have persistent proteinuria For now, I do not plan to change her blood pressure medications I refilled her prescription Losartan

## 2019-03-07 NOTE — Assessment & Plan Note (Signed)
She continues to have intermittent abdominal pain before bowel movement but overall have regular bowel movement Her examination is benign Her pain is not severe The patient has reduced intake of morphine sulfate and substitute with Tylenol

## 2019-03-07 NOTE — Assessment & Plan Note (Signed)
Her kidney function is stable despite proteinuria We will continue to monitor that closely

## 2019-03-07 NOTE — Assessment & Plan Note (Signed)
Her pancytopenia has improved with recent dose adjustment She will continue on reduced dose olaparib I plan to see her again next week for further follow-up and close blood count monitoring

## 2019-03-07 NOTE — Progress Notes (Signed)
[facility] OFFICE PROGRESS NOTE  Patient Care Team: Patient, No Pcp Per as PCP - General ([place]) Ileana Roup, MD as Consulting Physician (General Surgery)  ASSESSMENT & PLAN:  Fallopian tube cancer, carcinoma ([place]) She tolerated treatment better with reduced dose Lonie [place] She continues to have heavy proteinuria from recent bevacizumab I will see her again next week for further follow-up If her blood counts are stable and symptomatically improved, I will lengthen her visit to every 2 weeks  Pancytopenia, acquired ([place]) Her pancytopenia has improved with recent dose adjustment She will continue on reduced dose olaparib I plan to see her again next week for further follow-up and close blood count monitoring  Nausea & vomiting Her symptoms of nausea has improved She will continue to take antiemetics as directed  CKD (chronic kidney disease), stage III ([place]) Her kidney function is stable despite proteinuria We will continue to monitor that closely  Essential hypertension Her blood pressure control is suboptimal but much improved compared to before She still have persistent proteinuria For now, I do not plan to change her blood pressure medications I refilled her prescription Losartan  Abdominal pain She continues to have intermittent abdominal pain before bowel movement but overall have regular bowel movement Her examination is benign Her pain is not severe The patient has reduced intake of morphine sulfate and substitute with Tylenol  Other fatigue She had significant malaise and fatigue despite improvement of her blood counts I do not have a good explanation for this Her last TSH was mildly elevated I plan to recheck TSH level again in her next visit    Orders Placed This Encounter  Procedures  . TSH    Standing Status:   Future    Standing Expiration Date:   04/10/2020  . T4, free    Standing Status:   Future    Standing Expiration  Date:   04/10/2020    INTERVAL HISTORY: Please see below for problem oriented charting. She returns for further follow-up Both her son and daughter are available over the telephone She felt better today compared to last week She took antiemetics before leaving home and denies nausea or vomiting She continues to have intermittent abdominal cramps that comes and goes before bowel movements Her appetite is not good and she felt profoundly fatigue When she takes Tylenol, her fatigue has improved She has some minimal low-grade fever about 99.8 at nighttime She used to have some night sweats but that has resolved She denies leg swelling. Denies headache On review of her documented blood pressure over the last week, majority of her blood pressure is still elevated between systolic [id number] but I am seeing more blood pressure within the normal range  SUMMARY OF ONCOLOGIC HISTORY: Oncology History   Neg genetics.  HRD test was done on peritoneal fluid from 07/18/2018: HRD +     Fallopian tube cancer, carcinoma ([place])   06/18/2016 Tumor Marker    Patient's tumor was tested for the following markers: CA-125 Results of the tumor marker test revealed 298.    06/23/2016 PET scan    Diffuse hypermetabolic peritoneal carcinomatosis in the abdomen and pelvis, internal mammary adenopathy in the chest, hypermetabolic right paracardiac/diaphragmatic LN, bilateral pleural effusion and moderate ascites    06/24/2016 Procedure    Therapeutic paracentesis    06/24/2016 Pathology Results    Positive for adenocarcinoma, Mullerian primary    06/28/2016 Procedure    She underwent CT guided biopsy of omentum  06/28/2016 Pathology Results    High grade serous involving the omentum, positive for p53, PAX8, WT1 and p16.    07/01/2016 Procedure    Findings/impression:   1. Sonographic evaluation of the right internal jugular vein confirms patency. Sonography was required to gain central venous access. An image  documenting patency and needle access was saved.   2. Successful placement of right internal jugular vein subcutaneous Mediport as described. Spot film shows the catheter tip at the cavoatrial junction. No pneumothorax. The Mediport aspirates and flushes normally.    07/02/2016 - 08/12/2016 Chemotherapy    The patient had 3 cycles of carboplatin and taxol chemotherapy treatment.      08/29/2016 Imaging    Ct scan of chest, abdomen and pelvis showed marked improvement and resolution of thoracic adenopathy    09/08/2016 Surgery    She underwent interval debulking surgery with TAH/BSO and infracolic omentectomy by Dr. Wynelle Cleveland    09/08/2016 Pathology Results    Right tube: scant serous carcinoma. No viable tumor. Left tube and ovary: normal. Omentum: low volume serous carcinoma with few psamomma bodies.    10/07/2016 - 11/18/2016 Chemotherapy    The patient had 3 more cycles of carboplatin and taxol for chemotherapy treatment.      02/07/2017 Imaging    Impression:  1. Colonic diverticulosis without acute diverticulitis. 2. Stable calcification right kidney without hydronephrosis. 3. No acute process or significant interval change has occurred since August 29, 2016.    05/09/2018 Imaging    Impression:  Findings are most suspicious for acute sigmoid colonic diverticulitis with intraloop abscess formation.    05/14/2018 Imaging    Impression: The pelvic fluid collection contiguous with sigmoid colonic diverticulitis changes has not significant change since May 09, 2018. No new fluid collections are seen    06/28/2018 Imaging    IMPRESSION: Changes consistent with diverticulitis with free fluid within the pelvis. No focal contained abscess is seen. A previously noted fluid collection deep within the pelvis has resolved in the interval.   Stable nonobstructing right renal stone.  The remainder of the exam is stable from the prior study.     07/11/2018 PET scan    Small volume  ascites, new stranding and omental nodularity    07/13/2018 Imaging    Findings are consistent with peritoneal carcinomatosis given the history of ovarian carcinoma. There is omental caking as well as stranding within various areas of the peritoneal fat. There is also wall thickening of the sigmoid colon with adjacent stranding. This finding can also be related to peritoneal carcinomatosis and implants although an inflammatory process of the sigmoid colon is not excluded.   Right nephrolithiasis.  Ileus pattern.    07/16/2018 Imaging    Impression:   Findings of peritoneal carcinomatosis, rapidly progressive. Not present in July 2019, progressed when compared to September 13.  Multiple bowel loops are distorted but no distention or transition zone to suggest a component of active obstruction.    07/18/2018 Procedure    Findings/impression:  1. Sonographic evaluation of ascites. Sonography was required to gain access to ascites. An image documenting ascites was saved. 2. Successful ultrasound guided paracentesis using a temporary peritoneal drainage catheter inserted in the right upper quadrant as described yielding 600cc of ascites.     07/20/2018 Echocardiogram    General:  The patient was in normal sinus rhythm.  Left Ventricle:  Left ventricular ejection fraction was normal, estimated in the range of 60 to 65%. The left ventricular cavity size  appears normal. The left ventricular wall thickness appears normal. The left ventricle is thickened in a fashion  consistent with mild concentric hypertrophy. Doppler tissue velocities and mitral inflow profile are consistent with Stage I diastolic dysfunction. The calculated ejection fraction is 68.1 %.  Right Ventricle:  The right ventricle appears normal in size and function.  Left Atrium:  The left atrium appears normal.  Right Atrium:  The right atrium appears normal.  Aortic Valve:  The structure of the aortic valve is tricuspid.  There is evidence of trivial (trace) aortic regurgitation.  Mitral Valve:  There is no evidence of mitral regurgitation. The mitral valve appears normal in structure.  Pulmonic Valve:  There is evidence of trace (trivial) pulmonic regurgitation. The pulmonic valve appears normal in structure.  Tricuspid Valve:  There is evidence of trace (trivial) tricuspid regurgitation. The tricuspid valve appears normal in structure.  Pericardium:  There is no evidence of pericardial effusion.  Aorta:  The visualized portions of the aorta (ascending aorta, aortic root, and aortic arch) appear normal.  Pulmonic Artery:  Unable to obtain RVSP due to insufficient tricuspid regurgitation jet.  Conclusions: Left ventricular ejection fraction was normal, estimated in the range of 60 to 65%. The left ventricular cavity size appears normal. The left ventricular wall thickness appears normal. The left ventricle is thickened in a fashion consistent with mild concentric hypertrophy. Doppler tissue velocities and mitral inflow profile are consistent with Stage I diastolic dysfunction. The calculated ejection fraction is 68.1%.There is evidence of trivial (trace) aortic regurgitation.There is evidence of trace (trivial) pulmonic regurgitation.There is evidence of trace (trivial) tricuspid regurgitation.There is no evidence of pericardial effusion.Unable to obtain RVSP due to insufficient tricuspid regurgitation jet.    07/23/2018 - 12/25/2018 Chemotherapy    The patient had carboplatin, doxil and Avastin for chemotherapy treatment x 6 cycles    08/01/2018 Imaging    Ct head showed no injuries.    08/01/2018 Procedure    Findings/impression:  1. Sonographic evaluation of ascites. Sonography was required to gain access to ascites. An image documenting ascites was saved. 2. Successful ultrasound guided paracentesis using a temporary peritoneal drainage catheter inserted in the right upper quadrant as described yielding  400cc of ascites.     08/06/2018 Tumor Marker    Patient's tumor was tested for the following markers: CA-125 Results of the tumor marker test revealed 373    08/20/2018 Tumor Marker    Patient's tumor was tested for the following markers: CA-125 Results of the tumor marker test revealed 51.    10/03/2018 Genetic Testing    Patient has genetic testing done for genetics testing using her saliva. 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  Liver and spleen homogeneously enhance. No lesion seen.  The adjacent gallbladder is surgically absent.  The adrenal glands, kidneys, pancreas are normal.  Pelvic contents are remarkable for an absent uterus.  The mesenteric stranding has nearly completely resolved. Minimal reticular prominence throughout the pelvis.  No residual ascites.  No bowel wall thickening or. No dilated loops or transition zone.  The appendix is not seen. But no inflammatory changes in the right lower quadrant.  Osseous structures are intact.    11/27/2018 Imaging    Ct scan abdomen and pelvis showed colonic diverticulosis and nonspecific fluid levels in the bowel    12/31/2018 Tumor Marker    Patient's tumor  was tested for the following markers: CA-125 Results of the tumor marker test revealed 14.4    01/01/2019 Imaging    1. LEFT jugular Port-A-Cath catheter tip projects over the mid SVC. 2. No acute cardiopulmonary disease.     01/04/2019 Echocardiogram    1. The left ventricle has normal systolic function with an ejection fraction of 60-65%. The cavity size was normal. Left ventricular diastolic Doppler parameters are consistent with impaired relaxation.  2. The right ventricle has normal systolic function. The cavity was normal. There is no increase in right ventricular wall thickness.  3. The mitral valve is normal in structure.  4. The tricuspid valve is  normal in structure.  5. The aortic valve is normal in structure.  6. The pulmonic valve was normal in structure.  7. 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     02/13/2019 -  Chemotherapy    The patient is started on Lynparza due to [place]:   Eyes: Denies blurriness of vision Ears, nose, mouth, throat, and face: Denies mucositis or sore throat Respiratory: Denies cough, dyspnea or wheezes Cardiovascular: Denies palpitation, chest discomfort or lower extremity swelling Skin: Denies abnormal skin rashes Lymphatics: Denies new lymphadenopathy or easy bruising Neurological:Denies numbness, tingling or new weaknesses Behavioral/Psych: Mood is stable, no new changes  All other systems were reviewed with the patient and are negative.  I have reviewed the past medical history, past surgical history, social history and family history with the patient and they are unchanged from previous note.  ALLERGIES:  is allergic to latex.  MEDICATIONS:  Current Outpatient Medications  Medication Sig Dispense Refill  . Acetaminophen 325 MG CAPS Take 650 mg by mouth every 6 (six) hours as needed.    Marland Kitchen amLODipine (NORVASC) 10 MG tablet Take 10 mg by mouth daily.    . Ascorbic Acid (VITAMIN C PO) Take 500 mg by mouth daily.     Marland Kitchen atenolol (TENORMIN) 25 MG tablet Take 0.5 tablets (12.5 mg total) by mouth daily. 30 tablet 11  . Cholecalciferol (VITAMIN D PO) Take 1 tablet by mouth daily. D3 1000units    . escitalopram (LEXAPRO) 10 MG tablet Take 1 tablet (10 mg total) by mouth daily. 90 tablet 1  . fluticasone (FLONASE) 50 MCG/ACT nasal spray Place 1 spray into both nostrils daily.    . hydrALAZINE (APRESOLINE) 50 MG tablet Take 1 tablet (50 mg total) by mouth 3 (three) times daily. 90 tablet 1  . hydrochlorothiazide (HYDRODIURIL) 25 MG tablet Take 0.5 tablets (12.5 mg total) by mouth daily. 30 tablet 3  . losartan (COZAAR) 100 MG tablet Take 1 tablet (100 mg  total) by mouth daily. 30 tablet 0  . Multiple Vitamin (MULTIVITAMIN) tablet Take 1 tablet by mouth daily.    Marland Kitchen olaparib (LYNPARZA) 100 MG tablet Take 1 tablet (100 mg total) by mouth 2 (two) times daily. Swallow whole. May take with food to decrease nausea and vomiting. 60 tablet 11  . ondansetron (ZOFRAN) 8 MG tablet Take 8 mg by mouth every 8 (eight) hours as needed for nausea or vomiting.    Marland Kitchen oxyCODONE-acetaminophen (PERCOCET/ROXICET) 5-325 MG tablet Take by mouth every 4 (four) hours as needed for severe pain.    . polyethylene glycol (MIRALAX / GLYCOLAX) 17 g packet Take 17 g by mouth 2 (two) times daily.     No current facility-administered medications for this visit.     PHYSICAL EXAMINATION:  ECOG PERFORMANCE STATUS: 1 - Symptomatic but completely ambulatory  Vitals:   03/07/19 1148  BP: (!) 144/93  Pulse: 65  Resp: 17  Temp: 98.6 F (37 C)  SpO2: 97%   Filed Weights   03/07/19 1148  Weight: 144 lb 12.8 oz (65.7 kg)    GENERAL:alert, no distress and comfortable SKIN: skin color, texture, turgor are normal, no rashes or significant lesions EYES: normal, Conjunctiva are pink and non-injected, sclera clear OROPHARYNX:no exudate, no erythema and lips, buccal mucosa, and tongue normal  NECK: supple, thyroid normal size, non-tender, without nodularity LYMPH:  no palpable lymphadenopathy in the cervical, axillary or inguinal LUNGS: clear to auscultation and percussion with normal breathing effort HEART: regular rate & rhythm and no murmurs and no lower extremity edema ABDOMEN:abdomen soft, non-tender and normal bowel sounds Musculoskeletal:no cyanosis of digits and no clubbing  NEURO: alert & oriented x 3 with fluent speech, no focal motor/sensory deficits  LABORATORY DATA:  I have reviewed the data as listed    Component Value Date/Time   NA 138 03/07/2019 1121   NA 139 06/27/2018 1649   K 4.4 03/07/2019 1121   CL 106 03/07/2019 1121   CO2 23 03/07/2019 1121    GLUCOSE 98 03/07/2019 1121   BUN 28 (H) 03/07/2019 1121   BUN 17 06/27/2018 1649   CREATININE 1.17 (H) 03/07/2019 1121   CALCIUM 9.3 03/07/2019 1121   PROT 7.4 03/07/2019 1121   PROT 6.8 05/24/2018 0851   ALBUMIN 3.9 03/07/2019 1121   ALBUMIN 4.2 05/24/2018 0851   AST 20 03/07/2019 1121   ALT 14 03/07/2019 1121   ALKPHOS 62 03/07/2019 1121   BILITOT 0.2 (L) 03/07/2019 1121   BILITOT 0.2 05/24/2018 0851   GFRNONAA 43 (L) 03/07/2019 1121   GFRAA 50 (L) 03/07/2019 1121    No results found for: SPEP, UPEP  Lab Results  Component Value Date   WBC 5.3 03/07/2019   NEUTROABS 3.0 03/07/2019   HGB 10.4 (L) 03/07/2019   HCT 31.5 (L) 03/07/2019   MCV 110.5 (H) 03/07/2019   PLT 118 (L) 03/07/2019      Chemistry      Component Value Date/Time   NA 138 03/07/2019 1121   NA 139 06/27/2018 1649   K 4.4 03/07/2019 1121   CL 106 03/07/2019 1121   CO2 23 03/07/2019 1121   BUN 28 (H) 03/07/2019 1121   BUN 17 06/27/2018 1649   CREATININE 1.17 (H) 03/07/2019 1121      Component Value Date/Time   CALCIUM 9.3 03/07/2019 1121   ALKPHOS 62 03/07/2019 1121   AST 20 03/07/2019 1121   ALT 14 03/07/2019 1121   BILITOT 0.2 (L) 03/07/2019 1121   BILITOT 0.2 05/24/2018 0851      All questions were answered. The patient knows to call the clinic with any problems, questions or concerns. No barriers to learning was detected.  I spent 25 minutes counseling the patient face to face. The total time spent in the appointment was 30 minutes and more than 50% was on counseling and review of test results  Heath Lark, MD 03/07/2019 2:00 PM

## 2019-03-07 NOTE — Assessment & Plan Note (Signed)
Her symptoms of nausea has improved She will continue to take antiemetics as directed

## 2019-03-11 NOTE — Telephone Encounter (Signed)
Oral Oncology Patient Advocate Encounter  Despite my effort to put the application on hold, [employer] has approved the [place] to be filled until 02/13/19.  The patients grant funds will almost be exhausted after her June fill. I spoke to the patients son, [name] and advised him to go ahead and get that fill with [facility] to use the remaining grant funds. [name] will need to call [employer] to schedule the first fill with them. [employer] has attempted to contact the patient with no success.  I spoke to [place] and he verbalized understanding and great appreciation.  [place] Patient [place] Phone [phone] Fax [phone] 03/11/2019   1:55 PM

## 2019-03-12 NOTE — Telephone Encounter (Signed)
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 I called Lisa Moyer and we talked about this. We have decided to cancel [employer] and continue getting the Lynparza filled at [facility] and use the grants on file.  I will continue to watch for new grants to open closer to when she will need it.  Lisa Moyer verbalized understanding and great appreciation.  [place] Patient Lisa Moyer Phone [phone] Fax [phone] 03/12/2019   8:40 AM

## 2019-03-14 ENCOUNTER — Inpatient Hospital Stay: Payer: Medicare Other

## 2019-03-14 ENCOUNTER — Other Ambulatory Visit: Payer: Self-pay

## 2019-03-14 ENCOUNTER — Encounter: Payer: Self-pay | Admitting: Hematology and Oncology

## 2019-03-14 ENCOUNTER — Inpatient Hospital Stay (HOSPITAL_BASED_OUTPATIENT_CLINIC_OR_DEPARTMENT_OTHER): Payer: Medicare Other | Admitting: Hematology and Oncology

## 2019-03-14 DIAGNOSIS — D61818 Other pancytopenia: Secondary | ICD-10-CM

## 2019-03-14 DIAGNOSIS — Z9221 Personal history of antineoplastic chemotherapy: Secondary | ICD-10-CM

## 2019-03-14 DIAGNOSIS — C786 Secondary malignant neoplasm of retroperitoneum and peritoneum: Secondary | ICD-10-CM | POA: Diagnosis not present

## 2019-03-14 DIAGNOSIS — N183 Chronic kidney disease, stage 3 unspecified: Secondary | ICD-10-CM

## 2019-03-14 DIAGNOSIS — D539 Nutritional anemia, unspecified: Secondary | ICD-10-CM

## 2019-03-14 DIAGNOSIS — C5700 Malignant neoplasm of unspecified fallopian tube: Secondary | ICD-10-CM

## 2019-03-14 DIAGNOSIS — C5701 Malignant neoplasm of right fallopian tube: Secondary | ICD-10-CM

## 2019-03-14 DIAGNOSIS — T451X5A Adverse effect of antineoplastic and immunosuppressive drugs, initial encounter: Secondary | ICD-10-CM

## 2019-03-14 DIAGNOSIS — D6481 Anemia due to antineoplastic chemotherapy: Secondary | ICD-10-CM

## 2019-03-14 DIAGNOSIS — R5383 Other fatigue: Secondary | ICD-10-CM

## 2019-03-14 DIAGNOSIS — Z79899 Other long term (current) drug therapy: Secondary | ICD-10-CM

## 2019-03-14 DIAGNOSIS — R1030 Lower abdominal pain, unspecified: Secondary | ICD-10-CM

## 2019-03-14 DIAGNOSIS — I10 Essential (primary) hypertension: Secondary | ICD-10-CM

## 2019-03-14 LAB — CBC WITH DIFFERENTIAL/PLATELET
Abs Immature Granulocytes: 0.01 10*3/uL (ref 0.00–0.07)
Basophils Absolute: 0.1 10*3/uL (ref 0.0–0.1)
Basophils Relative: 1 %
Eosinophils Absolute: 0.1 10*3/uL (ref 0.0–0.5)
Eosinophils Relative: 2 %
HCT: 31 % — ABNORMAL LOW (ref 36.0–46.0)
Hemoglobin: 10.3 g/dL — ABNORMAL LOW (ref 12.0–15.0)
Immature Granulocytes: 0 %
Lymphocytes Relative: 30 %
Lymphs Abs: 1.6 10*3/uL (ref 0.7–4.0)
MCH: 36.4 pg — ABNORMAL HIGH (ref 26.0–34.0)
MCHC: 33.2 g/dL (ref 30.0–36.0)
MCV: 109.5 fL — ABNORMAL HIGH (ref 80.0–100.0)
Monocytes Absolute: 0.5 10*3/uL (ref 0.1–1.0)
Monocytes Relative: 10 %
Neutro Abs: 3 10*3/uL (ref 1.7–7.7)
Neutrophils Relative %: 57 %
Platelets: 137 10*3/uL — ABNORMAL LOW (ref 150–400)
RBC: 2.83 MIL/uL — ABNORMAL LOW (ref 3.87–5.11)
RDW: 14.1 % (ref 11.5–15.5)
WBC: 5.3 10*3/uL (ref 4.0–10.5)
nRBC: 0 % (ref 0.0–0.2)

## 2019-03-14 LAB — COMPREHENSIVE METABOLIC PANEL
ALT: 16 U/L (ref 0–44)
AST: 22 U/L (ref 15–41)
Albumin: 3.9 g/dL (ref 3.5–5.0)
Alkaline Phosphatase: 68 U/L (ref 38–126)
Anion gap: 10 (ref 5–15)
BUN: 28 mg/dL — ABNORMAL HIGH (ref 8–23)
CO2: 23 mmol/L (ref 22–32)
Calcium: 9.1 mg/dL (ref 8.9–10.3)
Chloride: 103 mmol/L (ref 98–111)
Creatinine, Ser: 1.49 mg/dL — ABNORMAL HIGH (ref 0.44–1.00)
GFR calc Af Amer: 38 mL/min — ABNORMAL LOW (ref >60)
GFR calc non Af Amer: 32 mL/min — ABNORMAL LOW (ref >60)
Glucose, Bld: 96 mg/dL (ref 70–99)
Potassium: 4.4 mmol/L (ref 3.5–5.1)
Sodium: 136 mmol/L (ref 135–145)
Total Bilirubin: 0.2 mg/dL — ABNORMAL LOW (ref 0.3–1.2)
Total Protein: 7.2 g/dL (ref 6.5–8.1)

## 2019-03-14 LAB — TSH: TSH: 6.676 u[IU]/mL — ABNORMAL HIGH (ref 0.308–3.960)

## 2019-03-14 LAB — T4, FREE: Free T4: 0.77 ng/dL — ABNORMAL LOW (ref 0.82–1.77)

## 2019-03-14 LAB — TOTAL PROTEIN, URINE DIPSTICK: Protein, ur: 100 mg/dL — AB

## 2019-03-14 NOTE — Assessment & Plan Note (Signed)
Pancytopenia is improving She is not symptomatic She will continue reduced dose olaparib for now

## 2019-03-14 NOTE — Assessment & Plan Note (Signed)
She has profound fatigue Thyroid function tests are pending We will call her son with test results tomorrow She will likely need thyroid replacement therapy.

## 2019-03-14 NOTE — Assessment & Plan Note (Signed)
She appears to tolerate reduced dose olaparib well Pancytopenia is improving Tumor marker is pending If her tumor marker is not elevated, I plan to space out her appointment until first week of June However, if her tumor marker is markedly elevated, I plan to repeat imaging study and to plan treatment accordingly I will call her son tomorrow with test results

## 2019-03-14 NOTE — Assessment & Plan Note (Signed)
She continues to have intermittent abdominal cramping before bowel movement Her bowel habits has been regular We will observe closely for now

## 2019-03-14 NOTE — Assessment & Plan Note (Signed)
Her blood pressure control has improved For now, I recommend discontinuation of hydrochlorothiazide She will continue close monitoring of blood pressure

## 2019-03-14 NOTE — Assessment & Plan Note (Signed)
She has mild elevated serum creatinine compared to last visit I recommend discontinuation of hydrochlorothiazide

## 2019-03-14 NOTE — Progress Notes (Signed)
[facility] OFFICE PROGRESS NOTE  Patient Care Team: Patient, No Pcp Per as PCP - General ([place]) Ileana Roup, MD as Consulting Physician (General Surgery)  ASSESSMENT & PLAN:  Fallopian tube cancer, carcinoma ([place]) She appears to tolerate reduced dose olaparib well Pancytopenia is improving Tumor marker is pending If her tumor marker is not elevated, I plan to space out her appointment until first week of June However, if her tumor marker is markedly elevated, I plan to repeat imaging study and to plan treatment accordingly I will call her son tomorrow with test results  Pancytopenia, acquired ([place]) Pancytopenia is improving She is not symptomatic She will continue reduced dose olaparib for now  Abdominal pain She continues to have intermittent abdominal cramping before bowel movement Her bowel habits has been regular We will observe closely for now  Other fatigue She has profound fatigue Thyroid function tests are pending We will call her son with test results tomorrow She will likely need thyroid replacement therapy.  Essential hypertension Her blood pressure control has improved For now, I recommend discontinuation of hydrochlorothiazide She will continue close monitoring of blood pressure  CKD (chronic kidney disease), stage III ([place]) She has mild elevated serum creatinine compared to last visit I recommend discontinuation of hydrochlorothiazide   No orders of the defined types were placed in this encounter.   INTERVAL HISTORY: Please see below for problem oriented charting. She returns for further follow-up Her son and daughter are available to collaborate her history over the telephone She brought with her a list of updates since last time I saw her Her blood pressure monitoring at home has been improving Her systolic blood pressure range between 130-160 with normal diastolic blood pressure She continues to complain of  feeling tired but slightly improved compared to last visit She continues to have intermittent abdominal cramp before bowel movement Her bowel function has been regular and soft She denies any more night sweats The patient felt overall that things are improving  SUMMARY OF ONCOLOGIC HISTORY: Oncology History   Neg genetics.  HRD test was done on peritoneal fluid from 07/18/2018: HRD +     Fallopian tube cancer, carcinoma ([place])   06/18/2016 Tumor Marker    Patient's tumor was tested for the following markers: CA-125 Results of the tumor marker test revealed 298.    06/23/2016 PET scan    Diffuse hypermetabolic peritoneal carcinomatosis in the abdomen and pelvis, internal mammary adenopathy in the chest, hypermetabolic right paracardiac/diaphragmatic LN, bilateral pleural effusion and moderate ascites    06/24/2016 Procedure    Therapeutic paracentesis    06/24/2016 Pathology Results    Positive for adenocarcinoma, Mullerian primary    06/28/2016 Procedure    She underwent CT guided biopsy of omentum    06/28/2016 Pathology Results    High grade serous involving the omentum, positive for p53, PAX8, WT1 and p16.    07/01/2016 Procedure    Findings/impression:   1. Sonographic evaluation of the right internal jugular vein confirms patency. Sonography was required to gain central venous access. An image documenting patency and needle access was saved.   2. Successful placement of right internal jugular vein subcutaneous Mediport as described. Spot film shows the catheter tip at the cavoatrial junction. No pneumothorax. The Mediport aspirates and flushes normally.    07/02/2016 - 08/12/2016 Chemotherapy    The patient had 3 cycles of carboplatin and taxol chemotherapy treatment.      08/29/2016 Imaging    Ct  scan of chest, abdomen and pelvis showed marked improvement and resolution of thoracic adenopathy    09/08/2016 Surgery    She underwent interval debulking surgery with TAH/BSO and  infracolic omentectomy by Dr. Wynelle Cleveland    09/08/2016 Pathology Results    Right tube: scant serous carcinoma. No viable tumor. Left tube and ovary: normal. Omentum: low volume serous carcinoma with few psamomma bodies.    10/07/2016 - 11/18/2016 Chemotherapy    The patient had 3 more cycles of carboplatin and taxol for chemotherapy treatment.      02/07/2017 Imaging    Impression:  1. Colonic diverticulosis without acute diverticulitis. 2. Stable calcification right kidney without hydronephrosis. 3. No acute process or significant interval change has occurred since August 29, 2016.    05/09/2018 Imaging    Impression:  Findings are most suspicious for acute sigmoid colonic diverticulitis with intraloop abscess formation.    05/14/2018 Imaging    Impression: The pelvic fluid collection contiguous with sigmoid colonic diverticulitis changes has not significant change since May 09, 2018. No new fluid collections are seen    06/28/2018 Imaging    IMPRESSION: Changes consistent with diverticulitis with free fluid within the pelvis. No focal contained abscess is seen. A previously noted fluid collection deep within the pelvis has resolved in the interval.   Stable nonobstructing right renal stone.  The remainder of the exam is stable from the prior study.     07/11/2018 PET scan    Small volume ascites, new stranding and omental nodularity    07/13/2018 Imaging    Findings are consistent with peritoneal carcinomatosis given the history of ovarian carcinoma. There is omental caking as well as stranding within various areas of the peritoneal fat. There is also wall thickening of the sigmoid colon with adjacent stranding. This finding can also be related to peritoneal carcinomatosis and implants although an inflammatory process of the sigmoid colon is not excluded.   Right nephrolithiasis.  Ileus pattern.    07/16/2018 Imaging    Impression:   Findings of peritoneal  carcinomatosis, rapidly progressive. Not present in July 2019, progressed when compared to September 13.  Multiple bowel loops are distorted but no distention or transition zone to suggest a component of active obstruction.    07/18/2018 Procedure    Findings/impression:  1. Sonographic evaluation of ascites. Sonography was required to gain access to ascites. An image documenting ascites was saved. 2. Successful ultrasound guided paracentesis using a temporary peritoneal drainage catheter inserted in the right upper quadrant as described yielding 600cc of ascites.     07/20/2018 Echocardiogram    General:  The patient was in normal sinus rhythm.  Left Ventricle:  Left ventricular ejection fraction was normal, estimated in the range of 60 to 65%. The left ventricular cavity size appears normal. The left ventricular wall thickness appears normal. The left ventricle is thickened in a fashion  consistent with mild concentric hypertrophy. Doppler tissue velocities and mitral inflow profile are consistent with Stage I diastolic dysfunction. The calculated ejection fraction is 68.1 %.  Right Ventricle:  The right ventricle appears normal in size and function.  Left Atrium:  The left atrium appears normal.  Right Atrium:  The right atrium appears normal.  Aortic Valve:  The structure of the aortic valve is tricuspid. There is evidence of trivial (trace) aortic regurgitation.  Mitral Valve:  There is no evidence of mitral regurgitation. The mitral valve appears normal in structure.  Pulmonic Valve:  There is evidence of trace (  trivial) pulmonic regurgitation. The pulmonic valve appears normal in structure.  Tricuspid Valve:  There is evidence of trace (trivial) tricuspid regurgitation. The tricuspid valve appears normal in structure.  Pericardium:  There is no evidence of pericardial effusion.  Aorta:  The visualized portions of the aorta (ascending aorta, aortic root, and aortic arch) appear  normal.  Pulmonic Artery:  Unable to obtain RVSP due to insufficient tricuspid regurgitation jet.  Conclusions: Left ventricular ejection fraction was normal, estimated in the range of 60 to 65%. The left ventricular cavity size appears normal. The left ventricular wall thickness appears normal. The left ventricle is thickened in a fashion consistent with mild concentric hypertrophy. Doppler tissue velocities and mitral inflow profile are consistent with Stage I diastolic dysfunction. The calculated ejection fraction is 68.1%.There is evidence of trivial (trace) aortic regurgitation.There is evidence of trace (trivial) pulmonic regurgitation.There is evidence of trace (trivial) tricuspid regurgitation.There is no evidence of pericardial effusion.Unable to obtain RVSP due to insufficient tricuspid regurgitation jet.    07/23/2018 - 12/25/2018 Chemotherapy    The patient had carboplatin, doxil and Avastin for chemotherapy treatment x 6 cycles    08/01/2018 Imaging    Ct head showed no injuries.    08/01/2018 Procedure    Findings/impression:  1. Sonographic evaluation of ascites. Sonography was required to gain access to ascites. An image documenting ascites was saved. 2. Successful ultrasound guided paracentesis using a temporary peritoneal drainage catheter inserted in the right upper quadrant as described yielding 400cc of ascites.     08/06/2018 Tumor Marker    Patient's tumor was tested for the following markers: CA-125 Results of the tumor marker test revealed 373    08/20/2018 Tumor Marker    Patient's tumor was tested for the following markers: CA-125 Results of the tumor marker test revealed 51.    10/03/2018 Genetic Testing    Patient has genetic testing done for genetics testing using her saliva. 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  Liver and spleen homogeneously enhance.  No lesion seen.  The adjacent gallbladder is surgically absent.  The adrenal glands, kidneys, pancreas are normal.  Pelvic contents are remarkable for an absent uterus.  The mesenteric stranding has nearly completely resolved. Minimal reticular prominence throughout the pelvis.  No residual ascites.  No bowel wall thickening or. No dilated loops or transition zone.  The appendix is not seen. But no inflammatory changes in the right lower quadrant.  Osseous structures are intact.    11/27/2018 Imaging    Ct scan abdomen and pelvis showed colonic diverticulosis and nonspecific fluid levels in the bowel    12/31/2018 Tumor Marker    Patient's tumor was tested for the following markers: CA-125 Results of the tumor marker test revealed 14.4    01/01/2019 Imaging    1. LEFT jugular Port-A-Cath catheter tip projects over the mid SVC. 2. No acute cardiopulmonary disease.     01/04/2019 Echocardiogram    1. The left ventricle has normal systolic function with an ejection fraction of 60-65%. The cavity size was normal. Left ventricular diastolic Doppler parameters are consistent with impaired relaxation.  2. The right ventricle has normal systolic function. The cavity was normal. There is no increase in right ventricular wall thickness.  3. The mitral valve is normal in structure.  4. The tricuspid valve is normal in structure.  5. The aortic valve  is normal in structure.  6. The pulmonic valve was normal in structure.  7. 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     02/13/2019 -  Chemotherapy    The patient is started on Lynparza due to [place]:   Constitutional: Denies fevers, chills or abnormal weight loss Eyes: Denies blurriness of vision Ears, nose, mouth, throat, and face: Denies mucositis or sore throat Respiratory: Denies cough, dyspnea or wheezes Cardiovascular: Denies palpitation, chest discomfort or lower extremity swelling Skin:  Denies abnormal skin rashes Lymphatics: Denies new lymphadenopathy or easy bruising Neurological:Denies numbness, tingling or new weaknesses Behavioral/Psych: Mood is stable, no new changes  All other systems were reviewed with the patient and are negative.  I have reviewed the past medical history, past surgical history, social history and family history with the patient and they are unchanged from previous note.  ALLERGIES:  is allergic to latex.  MEDICATIONS:  Current Outpatient Medications  Medication Sig Dispense Refill  . Acetaminophen 325 MG CAPS Take 650 mg by mouth every 6 (six) hours as needed.    Marland Kitchen amLODipine (NORVASC) 10 MG tablet Take 10 mg by mouth daily.    . Ascorbic Acid (VITAMIN C PO) Take 500 mg by mouth daily.     Marland Kitchen atenolol (TENORMIN) 25 MG tablet Take 0.5 tablets (12.5 mg total) by mouth daily. 30 tablet 11  . Cholecalciferol (VITAMIN D PO) Take 1 tablet by mouth daily. D3 1000units    . escitalopram (LEXAPRO) 10 MG tablet Take 1 tablet (10 mg total) by mouth daily. 90 tablet 1  . fluticasone (FLONASE) 50 MCG/ACT nasal spray Place 1 spray into both nostrils daily.    . hydrALAZINE (APRESOLINE) 50 MG tablet Take 1 tablet (50 mg total) by mouth 3 (three) times daily. 90 tablet 1  . losartan (COZAAR) 100 MG tablet Take 1 tablet (100 mg total) by mouth daily. 30 tablet 0  . Multiple Vitamin (MULTIVITAMIN) tablet Take 1 tablet by mouth daily.    Marland Kitchen olaparib (LYNPARZA) 100 MG tablet Take 1 tablet (100 mg total) by mouth 2 (two) times daily. Swallow whole. May take with food to decrease nausea and vomiting. 60 tablet 11  . ondansetron (ZOFRAN) 8 MG tablet Take 8 mg by mouth every 8 (eight) hours as needed for nausea or vomiting.    Marland Kitchen oxyCODONE-acetaminophen (PERCOCET/ROXICET) 5-325 MG tablet Take by mouth every 4 (four) hours as needed for severe pain.    . polyethylene glycol (MIRALAX / GLYCOLAX) 17 g packet Take 17 g by mouth 2 (two) times daily.     No current  facility-administered medications for this visit.     PHYSICAL EXAMINATION: ECOG PERFORMANCE STATUS: 1 - Symptomatic but completely ambulatory  Vitals:   03/14/19 1158  BP: (!) 130/59  Pulse: (!) 56  Resp: 18  Temp: 99.1 F (37.3 C)  SpO2: 100%   Filed Weights   03/14/19 1158  Weight: 146 lb 6.4 oz (66.4 kg)    GENERAL:alert, no distress and comfortable SKIN: skin color, texture, turgor are normal, no rashes or significant lesions EYES: normal, Conjunctiva are pink and non-injected, sclera clear OROPHARYNX:no exudate, no erythema and lips, buccal mucosa, and tongue normal  NECK: supple, thyroid normal size, non-tender, without nodularity LYMPH:  no palpable lymphadenopathy in the cervical, axillary or inguinal LUNGS: clear to auscultation and percussion with normal breathing effort HEART: regular rate & rhythm and no murmurs and no lower extremity  edema ABDOMEN:abdomen soft, non-tender and normal bowel sounds Musculoskeletal:no cyanosis of digits and no clubbing  NEURO: alert & oriented x 3 with fluent speech, no focal motor/sensory deficits  LABORATORY DATA:  I have reviewed the data as listed    Component Value Date/Time   NA 136 03/14/2019 1134   NA 139 06/27/2018 1649   K 4.4 03/14/2019 1134   CL 103 03/14/2019 1134   CO2 23 03/14/2019 1134   GLUCOSE 96 03/14/2019 1134   BUN 28 (H) 03/14/2019 1134   BUN 17 06/27/2018 1649   CREATININE 1.49 (H) 03/14/2019 1134   CALCIUM 9.1 03/14/2019 1134   PROT 7.2 03/14/2019 1134   PROT 6.8 05/24/2018 0851   ALBUMIN 3.9 03/14/2019 1134   ALBUMIN 4.2 05/24/2018 0851   AST 22 03/14/2019 1134   ALT 16 03/14/2019 1134   ALKPHOS 68 03/14/2019 1134   BILITOT 0.2 (L) 03/14/2019 1134   BILITOT 0.2 05/24/2018 0851   GFRNONAA 32 (L) 03/14/2019 1134   GFRAA 38 (L) 03/14/2019 1134    No results found for: SPEP, UPEP  Lab Results  Component Value Date   WBC 5.3 03/14/2019   NEUTROABS 3.0 03/14/2019   HGB 10.3 (L) 03/14/2019    HCT 31.0 (L) 03/14/2019   MCV 109.5 (H) 03/14/2019   PLT 137 (L) 03/14/2019      Chemistry      Component Value Date/Time   NA 136 03/14/2019 1134   NA 139 06/27/2018 1649   K 4.4 03/14/2019 1134   CL 103 03/14/2019 1134   CO2 23 03/14/2019 1134   BUN 28 (H) 03/14/2019 1134   BUN 17 06/27/2018 1649   CREATININE 1.49 (H) 03/14/2019 1134      Component Value Date/Time   CALCIUM 9.1 03/14/2019 1134   ALKPHOS 68 03/14/2019 1134   AST 22 03/14/2019 1134   ALT 16 03/14/2019 1134   BILITOT 0.2 (L) 03/14/2019 1134   BILITOT 0.2 05/24/2018 0851      All questions were answered. The patient knows to call the clinic with any problems, questions or concerns. No barriers to learning was detected.  I spent 25 minutes counseling the patient face to face. The total time spent in the appointment was 30 minutes and more than 50% was on counseling and review of test results  Heath Lark, MD 03/14/2019 2:22 PM

## 2019-03-15 ENCOUNTER — Telehealth: Payer: Self-pay

## 2019-03-15 ENCOUNTER — Other Ambulatory Visit: Payer: Self-pay | Admitting: Hematology and Oncology

## 2019-03-15 DIAGNOSIS — E039 Hypothyroidism, unspecified: Secondary | ICD-10-CM | POA: Insufficient documentation

## 2019-03-15 LAB — CA 125: Cancer Antigen (CA) 125: 25 U/mL (ref 0.0–38.1)

## 2019-03-15 MED ORDER — LEVOTHYROXINE SODIUM 50 MCG PO TABS: 25.0000 ug | ORAL_TABLET | Freq: Every day | ORAL | 3 refills | Status: DC

## 2019-03-15 NOTE — Telephone Encounter (Signed)
Spoke with pt's son, [name], to review lab results. Per Dr Alvy Bimler prescription for levothyroxine 16mcg sent to pharmacy.  [name] is aware and verbalizes understanding.

## 2019-03-21 ENCOUNTER — Telehealth: Payer: Self-pay | Admitting: Oncology

## 2019-03-21 ENCOUNTER — Encounter: Payer: Self-pay | Admitting: Hematology and Oncology

## 2019-03-21 NOTE — Telephone Encounter (Signed)
Lisa Moyer called back and said Lisa Moyer is doing OK.  She is still feeling fatigued but understands it may take awhile for the thyroid medication to start working.  He said she is happy and optimistic.  He has uploaded her BP readings to MyChart.

## 2019-03-21 NOTE — Telephone Encounter (Signed)
Left a message with patient's son, [name].  Requested a return call.

## 2019-03-21 NOTE — Telephone Encounter (Signed)
I will communicate with [name] directly through [employer]

## 2019-03-22 ENCOUNTER — Other Ambulatory Visit: Payer: Self-pay | Admitting: Hematology and Oncology

## 2019-03-26 MED FILL — LYNPARZA 100 MG TAB: 100 | 30 days supply | Qty: 120 | Fill #1

## 2019-03-27 ENCOUNTER — Other Ambulatory Visit: Payer: Self-pay | Admitting: Hematology and Oncology

## 2019-03-28 ENCOUNTER — Other Ambulatory Visit: Payer: Self-pay | Admitting: Hematology and Oncology

## 2019-03-28 ENCOUNTER — Telehealth: Payer: Self-pay

## 2019-03-28 ENCOUNTER — Encounter: Payer: Self-pay | Admitting: Hematology and Oncology

## 2019-03-28 MED ORDER — OXYCODONE-ACETAMINOPHEN 5-325 MG PO TABS: 1.0000 | ORAL_TABLET | Freq: Four times a day (QID) | ORAL | 0 refills | Status: DC | PRN

## 2019-03-28 NOTE — Telephone Encounter (Signed)
-----   Message from Heath Lark, MD sent at 03/28/2019  7:47 AM EDT ----- Regarding: call son [name] Please ask for her BP readings Her pharmacy is requesting hydralazine refills, I am hoping to get her off it

## 2019-03-28 NOTE — Telephone Encounter (Signed)
[employer] and given below message. He will take picture of readings and send on mychart this morning.

## 2019-04-04 ENCOUNTER — Inpatient Hospital Stay: Payer: Medicare Other

## 2019-04-04 ENCOUNTER — Inpatient Hospital Stay: Payer: Medicare Other | Attending: Hematology and Oncology | Admitting: Hematology and Oncology

## 2019-04-04 ENCOUNTER — Encounter: Payer: Self-pay | Admitting: Hematology and Oncology

## 2019-04-04 ENCOUNTER — Other Ambulatory Visit: Payer: Self-pay

## 2019-04-04 DIAGNOSIS — C5700 Malignant neoplasm of unspecified fallopian tube: Secondary | ICD-10-CM

## 2019-04-04 DIAGNOSIS — N183 Chronic kidney disease, stage 3 unspecified: Secondary | ICD-10-CM

## 2019-04-04 DIAGNOSIS — D6481 Anemia due to antineoplastic chemotherapy: Secondary | ICD-10-CM

## 2019-04-04 DIAGNOSIS — G4700 Insomnia, unspecified: Secondary | ICD-10-CM | POA: Insufficient documentation

## 2019-04-04 DIAGNOSIS — D539 Nutritional anemia, unspecified: Secondary | ICD-10-CM

## 2019-04-04 DIAGNOSIS — D61810 Antineoplastic chemotherapy induced pancytopenia: Secondary | ICD-10-CM | POA: Diagnosis not present

## 2019-04-04 DIAGNOSIS — I129 Hypertensive chronic kidney disease with stage 1 through stage 4 chronic kidney disease, or unspecified chronic kidney disease: Secondary | ICD-10-CM | POA: Insufficient documentation

## 2019-04-04 DIAGNOSIS — G4709 Other insomnia: Secondary | ICD-10-CM

## 2019-04-04 DIAGNOSIS — E039 Hypothyroidism, unspecified: Secondary | ICD-10-CM

## 2019-04-04 DIAGNOSIS — C5701 Malignant neoplasm of right fallopian tube: Secondary | ICD-10-CM | POA: Insufficient documentation

## 2019-04-04 DIAGNOSIS — I10 Essential (primary) hypertension: Secondary | ICD-10-CM

## 2019-04-04 DIAGNOSIS — Z95828 Presence of other vascular implants and grafts: Secondary | ICD-10-CM

## 2019-04-04 DIAGNOSIS — R5383 Other fatigue: Secondary | ICD-10-CM | POA: Insufficient documentation

## 2019-04-04 DIAGNOSIS — R197 Diarrhea, unspecified: Secondary | ICD-10-CM

## 2019-04-04 DIAGNOSIS — T451X5A Adverse effect of antineoplastic and immunosuppressive drugs, initial encounter: Secondary | ICD-10-CM

## 2019-04-04 LAB — CBC WITH DIFFERENTIAL/PLATELET
Abs Immature Granulocytes: 0.02 10*3/uL (ref 0.00–0.07)
Basophils Absolute: 0.1 10*3/uL (ref 0.0–0.1)
Basophils Relative: 1 %
Eosinophils Absolute: 0.1 10*3/uL (ref 0.0–0.5)
Eosinophils Relative: 3 %
HCT: 29.9 % — ABNORMAL LOW (ref 36.0–46.0)
Hemoglobin: 10.1 g/dL — ABNORMAL LOW (ref 12.0–15.0)
Immature Granulocytes: 0 %
Lymphocytes Relative: 25 %
Lymphs Abs: 1.3 10*3/uL (ref 0.7–4.0)
MCH: 37 pg — ABNORMAL HIGH (ref 26.0–34.0)
MCHC: 33.8 g/dL (ref 30.0–36.0)
MCV: 109.5 fL — ABNORMAL HIGH (ref 80.0–100.0)
Monocytes Absolute: 0.6 10*3/uL (ref 0.1–1.0)
Monocytes Relative: 11 %
Neutro Abs: 3.2 10*3/uL (ref 1.7–7.7)
Neutrophils Relative %: 60 %
Platelets: 136 10*3/uL — ABNORMAL LOW (ref 150–400)
RBC: 2.73 MIL/uL — ABNORMAL LOW (ref 3.87–5.11)
RDW: 14.3 % (ref 11.5–15.5)
WBC: 5.3 10*3/uL (ref 4.0–10.5)
nRBC: 0 % (ref 0.0–0.2)

## 2019-04-04 LAB — COMPREHENSIVE METABOLIC PANEL
ALT: 16 U/L (ref 0–44)
AST: 22 U/L (ref 15–41)
Albumin: 3.9 g/dL (ref 3.5–5.0)
Alkaline Phosphatase: 65 U/L (ref 38–126)
Anion gap: 9 (ref 5–15)
BUN: 24 mg/dL — ABNORMAL HIGH (ref 8–23)
CO2: 21 mmol/L — ABNORMAL LOW (ref 22–32)
Calcium: 9 mg/dL (ref 8.9–10.3)
Chloride: 106 mmol/L (ref 98–111)
Creatinine, Ser: 1.18 mg/dL — ABNORMAL HIGH (ref 0.44–1.00)
GFR calc Af Amer: 50 mL/min — ABNORMAL LOW (ref >60)
GFR calc non Af Amer: 43 mL/min — ABNORMAL LOW (ref >60)
Glucose, Bld: 104 mg/dL — ABNORMAL HIGH (ref 70–99)
Potassium: 4.4 mmol/L (ref 3.5–5.1)
Sodium: 136 mmol/L (ref 135–145)
Total Bilirubin: 0.3 mg/dL (ref 0.3–1.2)
Total Protein: 7.3 g/dL (ref 6.5–8.1)

## 2019-04-04 LAB — TSH: TSH: 3.401 u[IU]/mL (ref 0.308–3.960)

## 2019-04-04 LAB — TOTAL PROTEIN, URINE DIPSTICK: Protein, ur: 100 mg/dL — AB

## 2019-04-04 MED ORDER — SODIUM CHLORIDE 0.9% FLUSH
10.0000 mL | Freq: Once | INTRAVENOUS | Status: AC
  Administered 2019-04-04: 10 mL
  Filled 2019-04-04: qty 10

## 2019-04-04 MED ORDER — HEPARIN SOD (PORK) LOCK FLUSH 100 UNIT/ML IV SOLN
500.0000 [IU] | Freq: Once | INTRAVENOUS | Status: AC
  Administered 2019-04-04: 500 [IU]
  Filled 2019-04-04: qty 5

## 2019-04-04 NOTE — Progress Notes (Signed)
Met with patient in lobby to introduce myself as Arboriculturist and to offer available resources.  Discussed one-time $1000 Advertising account executive and qualificiations to assist with personal expenses while going through treatment. She verbalized understanding.  Gave her my card if interested in applying and for any additional financial questions or concerns.

## 2019-04-04 NOTE — Assessment & Plan Note (Signed)
Her blood pressure is still suboptimally controlled I recommend reintroduction of hydrochlorothiazide I will call her son next week for blood pressure medication adjustment

## 2019-04-04 NOTE — Progress Notes (Signed)
[facility] OFFICE PROGRESS NOTE  Patient Care Team: Patient, No Pcp Per as PCP - General ([place]) Ileana Roup, MD as Consulting Physician (General Surgery)  ASSESSMENT & PLAN:  Fallopian tube cancer, carcinoma ([place]) She appears to tolerate reduced dose olaparib well Pancytopenia is stable Tumor marker is pending If her tumor marker is not elevated, I plan to space out her appointment to every 6 weeks However, if her tumor marker is markedly elevated, I plan to repeat imaging study and to plan treatment accordingly I will call her son tomorrow with test results  Anemia due to antineoplastic chemotherapy Pancytopenia is stable She is not symptomatic She will continue reduced dose olaparib for now  CKD (chronic kidney disease), stage III ([place]) Renal function is stable I recommend resumption of hydrochlorothiazide for better blood pressure management.   Essential hypertension Her blood pressure is still suboptimally controlled I recommend reintroduction of hydrochlorothiazide I will call her son next week for blood pressure medication adjustment  Acquired hypothyroidism Her energy level remains poor TSH is pending I will call her son with test results tomorrow and adjust her thyroid medicine as needed  Diarrhea She has frequent bowel movement which she described as diarrhea although it is not associated with much stool output She has crampy abdominal discomfort that is usually alleviated with bowel movement I am wondering whether she can still be constipated with overflow diarrhea We discussed changing her diet with small fruit intake and continue laxatives as needed  Insomnia disorder She has chronic insomnia from other medical reasons She felt better when she takes Xanax I am concerned about risk of fall She wants to continue I explained the risk and benefits with the family   No orders of the defined types were placed in this  encounter.   INTERVAL HISTORY: Please see below for problem oriented charting. She returns for further follow-up Her son and daughter are available to collaborate her history over the telephone She brought with her a list of concerns She has crampy intermittent abdominal discomfort with associated frequent bowel movement What she meant by diarrhea is frequent liquid stool output with not much associated stool She has stopped taking her pain medicine and laxatives recently She denies nausea or vomiting She has poor energy level despite recent initiation of levothyroxine for hypothyroidism She prefers to take Xanax before bedtime to help her with sleep Denies recent fever or chills Her blood pressure control at home is suboptimal with several systolic blood pressure in the 160 range  SUMMARY OF ONCOLOGIC HISTORY: Oncology History   Neg genetics.  HRD test was done on peritoneal fluid from 07/18/2018: HRD +     Fallopian tube cancer, carcinoma ([place])   06/18/2016 Tumor Marker    Patient's tumor was tested for the following markers: CA-125 Results of the tumor marker test revealed 298.    06/23/2016 PET scan    Diffuse hypermetabolic peritoneal carcinomatosis in the abdomen and pelvis, internal mammary adenopathy in the chest, hypermetabolic right paracardiac/diaphragmatic LN, bilateral pleural effusion and moderate ascites    06/24/2016 Procedure    Therapeutic paracentesis    06/24/2016 Pathology Results    Positive for adenocarcinoma, [place]er[place] primary    06/28/2016 Procedure    She underwent CT guided biopsy of omentum    06/28/2016 Pathology Results    High grade serous involving the omentum, positive for p53, PAX8, WT1 and p16.    07/01/2016 Procedure    Findings/impression:   1. Sonographic evaluation  of the right internal jugular vein confirms patency. Sonography was required to gain central venous access. An image documenting patency and needle access was saved.   2.  Successful placement of right internal jugular vein subcutaneous Mediport as described. Spot film shows the catheter tip at the cavoatrial junction. No pneumothorax. The Mediport aspirates and flushes normally.    07/02/2016 - 08/12/2016 Chemotherapy    The patient had 3 cycles of carboplatin and taxol chemotherapy treatment.      08/29/2016 Imaging    Ct scan of chest, abdomen and pelvis showed marked improvement and resolution of thoracic adenopathy    09/08/2016 Surgery    She underwent interval debulking surgery with TAH/BSO and infracolic omentectomy by Dr. Wynelle Cleveland    09/08/2016 Pathology Results    Right tube: scant serous carcinoma. No viable tumor. Left tube and ovary: normal. Omentum: low volume serous carcinoma with few psamomma bodies.    10/07/2016 - 11/18/2016 Chemotherapy    The patient had 3 more cycles of carboplatin and taxol for chemotherapy treatment.      02/07/2017 Imaging    Impression:  1. Colonic diverticulosis without acute diverticulitis. 2. Stable calcification right kidney without hydronephrosis. 3. No acute process or significant interval change has occurred since August 29, 2016.    05/09/2018 Imaging    Impression:  Findings are most suspicious for acute sigmoid colonic diverticulitis with intraloop abscess formation.    05/14/2018 Imaging    Impression: The pelvic fluid collection contiguous with sigmoid colonic diverticulitis changes has not significant change since May 09, 2018. No new fluid collections are seen    06/28/2018 Imaging    IMPRESSION: Changes consistent with diverticulitis with free fluid within the pelvis. No focal contained abscess is seen. A previously noted fluid collection deep within the pelvis has resolved in the interval.   Stable nonobstructing right renal stone.  The remainder of the exam is stable from the prior study.     07/11/2018 PET scan    Small volume ascites, new stranding and omental nodularity     07/13/2018 Imaging    Findings are consistent with peritoneal carcinomatosis given the history of ovarian carcinoma. There is omental caking as well as stranding within various areas of the peritoneal fat. There is also wall thickening of the sigmoid colon with adjacent stranding. This finding can also be related to peritoneal carcinomatosis and implants although an inflammatory process of the sigmoid colon is not excluded.   Right nephrolithiasis.  Ileus pattern.    07/16/2018 Imaging    Impression:   Findings of peritoneal carcinomatosis, rapidly progressive. Not present in July 2019, progressed when compared to September 13.  Multiple bowel loops are distorted but no distention or transition zone to suggest a component of active obstruction.    07/18/2018 Procedure    Findings/impression:  1. Sonographic evaluation of ascites. Sonography was required to gain access to ascites. An image documenting ascites was saved. 2. Successful ultrasound guided paracentesis using a temporary peritoneal drainage catheter inserted in the right upper quadrant as described yielding 600cc of ascites.     07/20/2018 Echocardiogram    General:  The patient was in normal sinus rhythm.  Left Ventricle:  Left ventricular ejection fraction was normal, estimated in the range of 60 to 65%. The left ventricular cavity size appears normal. The left ventricular wall thickness appears normal. The left ventricle is thickened in a fashion  consistent with mild concentric hypertrophy. Doppler tissue velocities and mitral inflow profile are consistent with  Stage I diastolic dysfunction. The calculated ejection fraction is 68.1 %.  Right Ventricle:  The right ventricle appears normal in size and function.  Left Atrium:  The left atrium appears normal.  Right Atrium:  The right atrium appears normal.  Aortic Valve:  The structure of the aortic valve is tricuspid. There is evidence of trivial (trace) aortic  regurgitation.  Mitral Valve:  There is no evidence of mitral regurgitation. The mitral valve appears normal in structure.  Pulmonic Valve:  There is evidence of trace (trivial) pulmonic regurgitation. The pulmonic valve appears normal in structure.  Tricuspid Valve:  There is evidence of trace (trivial) tricuspid regurgitation. The tricuspid valve appears normal in structure.  Pericardium:  There is no evidence of pericardial effusion.  Aorta:  The visualized portions of the aorta (ascending aorta, aortic root, and aortic arch) appear normal.  Pulmonic Artery:  Unable to obtain RVSP due to insufficient tricuspid regurgitation jet.  Conclusions: Left ventricular ejection fraction was normal, estimated in the range of 60 to 65%. The left ventricular cavity size appears normal. The left ventricular wall thickness appears normal. The left ventricle is thickened in a fashion consistent with mild concentric hypertrophy. Doppler tissue velocities and mitral inflow profile are consistent with Stage I diastolic dysfunction. The calculated ejection fraction is 68.1%.There is evidence of trivial (trace) aortic regurgitation.There is evidence of trace (trivial) pulmonic regurgitation.There is evidence of trace (trivial) tricuspid regurgitation.There is no evidence of pericardial effusion.Unable to obtain RVSP due to insufficient tricuspid regurgitation jet.    07/23/2018 - 12/25/2018 Chemotherapy    The patient had carboplatin, doxil and Avastin for chemotherapy treatment x 6 cycles    08/01/2018 Imaging    Ct head showed no injuries.    08/01/2018 Procedure    Findings/impression:  1. Sonographic evaluation of ascites. Sonography was required to gain access to ascites. An image documenting ascites was saved. 2. Successful ultrasound guided paracentesis using a temporary peritoneal drainage catheter inserted in the right upper quadrant as described yielding 400cc of ascites.     08/06/2018 Tumor Marker     Patient's tumor was tested for the following markers: CA-125 Results of the tumor marker test revealed 373    08/20/2018 Tumor Marker    Patient's tumor was tested for the following markers: CA-125 Results of the tumor marker test revealed 51.    10/03/2018 Genetic Testing    Patient has genetic testing done for genetics testing using her saliva. 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  Liver and spleen homogeneously enhance. No lesion seen.  The adjacent gallbladder is surgically absent.  The adrenal glands, kidneys, pancreas are normal.  Pelvic contents are remarkable for an absent uterus.  The mesenteric stranding has nearly completely resolved. Minimal reticular prominence throughout the pelvis.  No residual ascites.  No bowel wall thickening or. No dilated loops or transition zone.  The appendix is not seen. But no inflammatory changes in the right lower quadrant.  Osseous structures are intact.    11/27/2018 Imaging    Ct scan abdomen and pelvis showed colonic diverticulosis and nonspecific fluid levels in the bowel    12/31/2018 Tumor Marker    Patient's tumor was tested for the following markers: CA-125 Results of the tumor marker test revealed 14.4    01/01/2019 Imaging    1. LEFT jugular Port-A-Cath catheter tip projects over the mid  SVC. 2. No acute cardiopulmonary disease.     01/04/2019 Echocardiogram    1. The left ventricle has normal systolic function with an ejection fraction of 60-65%. The cavity size was normal. Left ventricular diastolic Doppler parameters are consistent with impaired relaxation.  2. The right ventricle has normal systolic function. The cavity was normal. There is no increase in right ventricular wall thickness.  3. The mitral valve is normal in structure.  4. The tricuspid valve is normal in structure.  5. The aortic valve is  normal in structure.  6. The pulmonic valve was normal in structure.  7. 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     02/13/2019 -  Chemotherapy    The patient is started on Lynparza due to [place]:   Constitutional: Denies fevers, chills or abnormal weight loss Eyes: Denies blurriness of vision Ears, nose, mouth, throat, and face: Denies mucositis or sore throat Respiratory: Denies cough, dyspnea or wheezes Cardiovascular: Denies palpitation, chest discomfort or lower extremity swelling Skin: Denies abnormal skin rashes Lymphatics: Denies new lymphadenopathy or easy bruising Neurological:Denies numbness, tingling or new weaknesses Behavioral/Psych: Mood is stable, no new changes  All other systems were reviewed with the patient and are negative.  I have reviewed the past medical history, past surgical history, social history and family history with the patient and they are unchanged from previous note.  ALLERGIES:  is allergic to latex.  MEDICATIONS:  Current Outpatient Medications  Medication Sig Dispense Refill  . ALPRAZolam (XANAX) 0.25 MG tablet Take 0.25 mg by mouth at bedtime as needed.    . calcium carbonate (TUMS) 500 MG chewable tablet Chew 1 tablet by mouth as needed for indigestion or heartburn.    . Multiple Vitamins-Minerals (VITAMIN D3 COMPLETE PO) Take 1,000 Units by mouth daily. 1000 IU Vitamin D3    . Acetaminophen 325 MG CAPS Take 650 mg by mouth every 6 (six) hours as needed.    Marland Kitchen amLODipine (NORVASC) 10 MG tablet Take 10 mg by mouth daily.    . Ascorbic Acid (VITAMIN C PO) Take 500 mg by mouth daily.     Marland Kitchen atenolol (TENORMIN) 25 MG tablet Take 0.5 tablets (12.5 mg total) by mouth daily. 30 tablet 11  . Cholecalciferol (VITAMIN D PO) Take 1 tablet by mouth daily. D3 1000units    . escitalopram (LEXAPRO) 10 MG tablet Take 1 tablet (10 mg total) by mouth daily. 90 tablet 1  . fluticasone (FLONASE) 50 MCG/ACT nasal spray  Place 1 spray into both nostrils daily.    . hydrALAZINE (APRESOLINE) 50 MG tablet TAKE ONE TABLET BY MOUTH THREE TIMES A DAY 90 tablet 0  . hydrochlorothiazide (MICROZIDE) 12.5 MG capsule Take 12.5 mg by mouth daily.    Marland Kitchen levothyroxine (SYNTHROID) 50 MCG tablet Take 0.5 tablets (25 mcg total) by mouth daily before breakfast. 30 tablet 3  . losartan (COZAAR) 100 MG tablet Take 1 tablet (100 mg total) by mouth daily. 30 tablet 0  . Multiple Vitamin (MULTIVITAMIN) tablet Take 1 tablet by mouth daily.    Marland Kitchen olaparib (LYNPARZA) 100 MG tablet Take 1 tablet (100 mg total) by mouth 2 (two) times daily. Swallow whole. May take with food to decrease nausea and vomiting. 60 tablet 11  . ondansetron (ZOFRAN) 8 MG tablet Take 8 mg by mouth every 8 (eight) hours as needed for nausea or vomiting.    Marland Kitchen oxyCODONE-acetaminophen (PERCOCET/ROXICET) 5-325 MG tablet Take  1 tablet by mouth every 6 (six) hours as needed for severe pain. 30 tablet 0  . polyethylene glycol (MIRALAX / GLYCOLAX) 17 g packet Take 17 g by mouth 2 (two) times daily.     No current facility-administered medications for this visit.     PHYSICAL EXAMINATION: ECOG PERFORMANCE STATUS: 1 - Symptomatic but completely ambulatory  Vitals:   04/04/19 1132  BP: 132/76  Pulse: 66  Resp: 18  Temp: 97.6 F (36.4 C)  SpO2: 98%   Filed Weights   04/04/19 1132  Weight: 143 lb 6.4 oz (65 kg)    GENERAL:alert, no distress and comfortable SKIN: skin color, texture, turgor are normal, no rashes or significant lesions EYES: normal, Conjunctiva are pink and non-injected, sclera clear OROPHARYNX:no exudate, no erythema and lips, buccal mucosa, and tongue normal  NECK: supple, thyroid normal size, non-tender, without nodularity LYMPH:  no palpable lymphadenopathy in the cervical, axillary or inguinal LUNGS: clear to auscultation and percussion with normal breathing effort HEART: regular rate & rhythm and no murmurs and no lower extremity  edema ABDOMEN:abdomen soft, non-tender and normal bowel sounds Musculoskeletal:no cyanosis of digits and no clubbing  NEURO: alert & oriented x 3 with fluent speech, no focal motor/sensory deficits  LABORATORY DATA:  I have reviewed the data as listed    Component Value Date/Time   NA 136 04/04/2019 1119   NA 139 06/27/2018 1649   K 4.4 04/04/2019 1119   CL 106 04/04/2019 1119   CO2 21 (L) 04/04/2019 1119   GLUCOSE 104 (H) 04/04/2019 1119   BUN 24 (H) 04/04/2019 1119   BUN 17 06/27/2018 1649   CREATININE 1.18 (H) 04/04/2019 1119   CALCIUM 9.0 04/04/2019 1119   PROT 7.3 04/04/2019 1119   PROT 6.8 05/24/2018 0851   ALBUMIN 3.9 04/04/2019 1119   ALBUMIN 4.2 05/24/2018 0851   AST 22 04/04/2019 1119   ALT 16 04/04/2019 1119   ALKPHOS 65 04/04/2019 1119   BILITOT 0.3 04/04/2019 1119   BILITOT 0.2 05/24/2018 0851   GFRNONAA 43 (L) 04/04/2019 1119   GFRAA 50 (L) 04/04/2019 1119    No results found for: SPEP, UPEP  Lab Results  Component Value Date   WBC 5.3 04/04/2019   NEUTROABS 3.2 04/04/2019   HGB 10.1 (L) 04/04/2019   HCT 29.9 (L) 04/04/2019   MCV 109.5 (H) 04/04/2019   PLT 136 (L) 04/04/2019      Chemistry      Component Value Date/Time   NA 136 04/04/2019 1119   NA 139 06/27/2018 1649   K 4.4 04/04/2019 1119   CL 106 04/04/2019 1119   CO2 21 (L) 04/04/2019 1119   BUN 24 (H) 04/04/2019 1119   BUN 17 06/27/2018 1649   CREATININE 1.18 (H) 04/04/2019 1119      Component Value Date/Time   CALCIUM 9.0 04/04/2019 1119   ALKPHOS 65 04/04/2019 1119   AST 22 04/04/2019 1119   ALT 16 04/04/2019 1119   BILITOT 0.3 04/04/2019 1119   BILITOT 0.2 05/24/2018 0851      All questions were answered. The patient knows to call the clinic with any problems, questions or concerns. No barriers to learning was detected.  I spent 25 minutes counseling the patient face to face. The total time spent in the appointment was 30 minutes and more than 50% was on counseling and review  of test results  Heath Lark, MD 04/04/2019 1:49 PM

## 2019-04-04 NOTE — Assessment & Plan Note (Signed)
Renal function is stable I recommend resumption of hydrochlorothiazide for better blood pressure management.

## 2019-04-04 NOTE — Assessment & Plan Note (Signed)
Her energy level remains poor TSH is pending I will call her son with test results tomorrow and adjust her thyroid medicine as needed

## 2019-04-04 NOTE — Assessment & Plan Note (Signed)
She appears to tolerate reduced dose olaparib well Pancytopenia is stable Tumor marker is pending If her tumor marker is not elevated, I plan to space out her appointment to every 6 weeks However, if her tumor marker is markedly elevated, I plan to repeat imaging study and to plan treatment accordingly I will call her son tomorrow with test results

## 2019-04-04 NOTE — Assessment & Plan Note (Signed)
She has frequent bowel movement which she described as diarrhea although it is not associated with much stool output She has crampy abdominal discomfort that is usually alleviated with bowel movement I am wondering whether she can still be constipated with overflow diarrhea We discussed changing her diet with small fruit intake and continue laxatives as needed

## 2019-04-04 NOTE — Assessment & Plan Note (Signed)
Pancytopenia is stable She is not symptomatic She will continue reduced dose olaparib for now

## 2019-04-04 NOTE — Assessment & Plan Note (Signed)
She has chronic insomnia from other medical reasons She felt better when she takes Xanax I am concerned about risk of fall She wants to continue I explained the risk and benefits with the family

## 2019-04-05 ENCOUNTER — Telehealth: Payer: Self-pay | Admitting: *Deleted

## 2019-04-05 ENCOUNTER — Encounter: Payer: Self-pay | Admitting: Hematology and Oncology

## 2019-04-05 LAB — CA 125: Cancer Antigen (CA) 125: 30.9 U/mL (ref 0.0–38.1)

## 2019-04-05 NOTE — Telephone Encounter (Signed)
-----   Message from Heath Lark, MD sent at 04/05/2019  9:43 AM EDT ----- Regarding: call son Lisa Moyer Pls let him know that CA-125 is a bit higher (30 but still in normal range); thyroid test is now normal We can either repeat CT next week or continue Lynparza a few more weeks and repeat CA-125 again to make sure it is not a fluke before repeat CT Let me know what he and his family decide

## 2019-04-05 NOTE — Telephone Encounter (Signed)
Spoke to patient's son. He would like to continue medication and come in 2 weeks for another CA125. I have sent a message to the schedulers.

## 2019-04-11 ENCOUNTER — Telehealth: Payer: Self-pay

## 2019-04-11 ENCOUNTER — Encounter: Payer: Self-pay | Admitting: Hematology and Oncology

## 2019-04-11 NOTE — Telephone Encounter (Signed)
Called and left below message. Ask him to call for questions. 

## 2019-04-11 NOTE — Telephone Encounter (Signed)
-----   Message from Heath Lark, MD sent at 04/11/2019  8:05 AM EDT ----- Regarding: BP readings Pls call son [name] to scan BP readings by 12 pm

## 2019-04-19 ENCOUNTER — Inpatient Hospital Stay: Payer: Medicare Other

## 2019-04-19 ENCOUNTER — Other Ambulatory Visit: Payer: Self-pay

## 2019-04-19 DIAGNOSIS — C5701 Malignant neoplasm of right fallopian tube: Secondary | ICD-10-CM | POA: Diagnosis not present

## 2019-04-19 DIAGNOSIS — T451X5A Adverse effect of antineoplastic and immunosuppressive drugs, initial encounter: Secondary | ICD-10-CM

## 2019-04-19 DIAGNOSIS — D539 Nutritional anemia, unspecified: Secondary | ICD-10-CM

## 2019-04-19 DIAGNOSIS — C5700 Malignant neoplasm of unspecified fallopian tube: Secondary | ICD-10-CM

## 2019-04-19 DIAGNOSIS — E039 Hypothyroidism, unspecified: Secondary | ICD-10-CM

## 2019-04-19 DIAGNOSIS — D6481 Anemia due to antineoplastic chemotherapy: Secondary | ICD-10-CM

## 2019-04-19 LAB — COMPREHENSIVE METABOLIC PANEL
ALT: 12 U/L (ref 0–44)
AST: 19 U/L (ref 15–41)
Albumin: 3.8 g/dL (ref 3.5–5.0)
Alkaline Phosphatase: 67 U/L (ref 38–126)
Anion gap: 8 (ref 5–15)
BUN: 41 mg/dL — ABNORMAL HIGH (ref 8–23)
CO2: 24 mmol/L (ref 22–32)
Calcium: 9.1 mg/dL (ref 8.9–10.3)
Chloride: 102 mmol/L (ref 98–111)
Creatinine, Ser: 1.35 mg/dL — ABNORMAL HIGH (ref 0.44–1.00)
GFR calc Af Amer: 42 mL/min — ABNORMAL LOW (ref >60)
GFR calc non Af Amer: 36 mL/min — ABNORMAL LOW (ref >60)
Glucose, Bld: 106 mg/dL — ABNORMAL HIGH (ref 70–99)
Potassium: 4.7 mmol/L (ref 3.5–5.1)
Sodium: 134 mmol/L — ABNORMAL LOW (ref 135–145)
Total Bilirubin: 0.3 mg/dL (ref 0.3–1.2)
Total Protein: 7.3 g/dL (ref 6.5–8.1)

## 2019-04-19 LAB — CBC WITH DIFFERENTIAL/PLATELET
Abs Immature Granulocytes: 0.02 10*3/uL (ref 0.00–0.07)
Basophils Absolute: 0 10*3/uL (ref 0.0–0.1)
Basophils Relative: 1 %
Eosinophils Absolute: 0.1 10*3/uL (ref 0.0–0.5)
Eosinophils Relative: 2 %
HCT: 30.4 % — ABNORMAL LOW (ref 36.0–46.0)
Hemoglobin: 10 g/dL — ABNORMAL LOW (ref 12.0–15.0)
Immature Granulocytes: 0 %
Lymphocytes Relative: 24 %
Lymphs Abs: 1.1 10*3/uL (ref 0.7–4.0)
MCH: 36.1 pg — ABNORMAL HIGH (ref 26.0–34.0)
MCHC: 32.9 g/dL (ref 30.0–36.0)
MCV: 109.7 fL — ABNORMAL HIGH (ref 80.0–100.0)
Monocytes Absolute: 0.5 10*3/uL (ref 0.1–1.0)
Monocytes Relative: 11 %
Neutro Abs: 2.8 10*3/uL (ref 1.7–7.7)
Neutrophils Relative %: 62 %
Platelets: 139 10*3/uL — ABNORMAL LOW (ref 150–400)
RBC: 2.77 MIL/uL — ABNORMAL LOW (ref 3.87–5.11)
RDW: 13.6 % (ref 11.5–15.5)
WBC: 4.5 10*3/uL (ref 4.0–10.5)
nRBC: 0 % (ref 0.0–0.2)

## 2019-04-19 LAB — TSH: TSH: 2.458 u[IU]/mL (ref 0.308–3.960)

## 2019-04-19 LAB — TOTAL PROTEIN, URINE DIPSTICK

## 2019-04-22 ENCOUNTER — Other Ambulatory Visit: Payer: Self-pay | Admitting: *Deleted

## 2019-04-22 ENCOUNTER — Telehealth: Payer: Self-pay

## 2019-04-22 ENCOUNTER — Encounter: Payer: Self-pay | Admitting: Hematology and Oncology

## 2019-04-22 DIAGNOSIS — C5700 Malignant neoplasm of unspecified fallopian tube: Secondary | ICD-10-CM

## 2019-04-22 NOTE — Telephone Encounter (Signed)
-----   Message from Heath Lark, MD sent at 04/22/2019  8:05 AM EDT ----- Regarding: CA-125 The CA-125 is on standing order monthly, so it was not drawn last Friday Can the lab add to recent labs from Friday?

## 2019-04-22 NOTE — Telephone Encounter (Signed)
Spoke with Verdis Frederickson in the lab.  Yes, they can do CA 125 with blood they have from Friday.

## 2019-04-23 ENCOUNTER — Telehealth: Payer: Self-pay | Admitting: Oncology

## 2019-04-23 ENCOUNTER — Other Ambulatory Visit: Payer: Self-pay | Admitting: Hematology and Oncology

## 2019-04-23 DIAGNOSIS — C569 Malignant neoplasm of unspecified ovary: Secondary | ICD-10-CM

## 2019-04-23 DIAGNOSIS — C5700 Malignant neoplasm of unspecified fallopian tube: Secondary | ICD-10-CM

## 2019-04-23 LAB — CA 125: Cancer Antigen (CA) 125: 44.8 U/mL — ABNORMAL HIGH (ref 0.0–38.1)

## 2019-04-23 NOTE — Telephone Encounter (Signed)
Called Lisa Moyer and advised him of appointment with Dr. Alvy Bimler on 04/29/19 at 2 pm.  He verbalized understanding and agreement.

## 2019-04-24 MED FILL — LYNPARZA 100 MG TAB: 100 | 30 days supply | Qty: 120 | Fill #2

## 2019-04-26 ENCOUNTER — Other Ambulatory Visit: Payer: Self-pay

## 2019-04-26 ENCOUNTER — Ambulatory Visit (HOSPITAL_COMMUNITY)
Admission: RE | Admit: 2019-04-26 | Discharge: 2019-04-26 | Disposition: A | Discharge status: Home / Self Care | Payer: Medicare Other | Source: Ambulatory Visit | Attending: Hematology and Oncology | Admitting: Hematology and Oncology

## 2019-04-26 ENCOUNTER — Other Ambulatory Visit: Payer: Self-pay | Admitting: Hematology and Oncology

## 2019-04-26 DIAGNOSIS — C5700 Malignant neoplasm of unspecified fallopian tube: Secondary | ICD-10-CM | POA: Diagnosis not present

## 2019-04-26 DIAGNOSIS — C569 Malignant neoplasm of unspecified ovary: Secondary | ICD-10-CM | POA: Diagnosis present

## 2019-04-26 DIAGNOSIS — K5730 Diverticulosis of large intestine without perforation or abscess without bleeding: Secondary | ICD-10-CM | POA: Diagnosis not present

## 2019-04-26 MED ORDER — OXYCODONE-ACETAMINOPHEN 5-325 MG PO TABS: 1.0000 | ORAL_TABLET | Freq: Four times a day (QID) | ORAL | 0 refills | Status: DC | PRN

## 2019-04-26 MED ORDER — IOHEXOL 300 MG/ML  SOLN
80.0000 mL | Freq: Once | INTRAMUSCULAR | Status: AC | PRN
  Administered 2019-04-26: 80 mL via INTRAVENOUS

## 2019-04-26 MED ORDER — SODIUM CHLORIDE (PF) 0.9 % IJ SOLN
INTRAMUSCULAR | Status: AC
  Filled 2019-04-26: qty 100

## 2019-04-29 ENCOUNTER — Other Ambulatory Visit: Payer: Self-pay

## 2019-04-29 ENCOUNTER — Inpatient Hospital Stay (HOSPITAL_BASED_OUTPATIENT_CLINIC_OR_DEPARTMENT_OTHER): Payer: Medicare Other | Admitting: Hematology and Oncology

## 2019-04-29 DIAGNOSIS — G4709 Other insomnia: Secondary | ICD-10-CM

## 2019-04-29 DIAGNOSIS — I10 Essential (primary) hypertension: Secondary | ICD-10-CM

## 2019-04-29 DIAGNOSIS — C5700 Malignant neoplasm of unspecified fallopian tube: Secondary | ICD-10-CM

## 2019-04-29 DIAGNOSIS — N183 Chronic kidney disease, stage 3 unspecified: Secondary | ICD-10-CM

## 2019-04-29 DIAGNOSIS — C5701 Malignant neoplasm of right fallopian tube: Secondary | ICD-10-CM

## 2019-04-29 DIAGNOSIS — R5383 Other fatigue: Secondary | ICD-10-CM | POA: Diagnosis not present

## 2019-04-29 DIAGNOSIS — R1030 Lower abdominal pain, unspecified: Secondary | ICD-10-CM

## 2019-04-29 DIAGNOSIS — I129 Hypertensive chronic kidney disease with stage 1 through stage 4 chronic kidney disease, or unspecified chronic kidney disease: Secondary | ICD-10-CM

## 2019-04-29 DIAGNOSIS — Z7189 Other specified counseling: Secondary | ICD-10-CM

## 2019-04-30 ENCOUNTER — Telehealth: Payer: Self-pay | Admitting: Hematology and Oncology

## 2019-04-30 ENCOUNTER — Encounter: Payer: Self-pay | Admitting: Hematology and Oncology

## 2019-04-30 NOTE — Assessment & Plan Note (Signed)
She continues to have excessive fatigue Her recent TSH was within normal limits I recommend graduated exercise as tolerated

## 2019-04-30 NOTE — Assessment & Plan Note (Signed)
Her blood pressure is returning back to normal and she have almost no further proteinuria I have discontinued hydralazine recently I plan to continue tapering off some of her blood pressure medications in the future

## 2019-04-30 NOTE — Telephone Encounter (Signed)
Scheduled appt per 6/30 sch message - pt son is aware of appt date and time

## 2019-04-30 NOTE — Assessment & Plan Note (Signed)
She continues to have intermittent abdominal pain No evidence of bowel obstruction or residual disease is seen on CT I recommend her to continue on oxycodone as needed

## 2019-04-30 NOTE — Assessment & Plan Note (Signed)
I have extensive goals of care discussion with the patient and family They are pleased with the test results and are willing to continue on olaparib for now

## 2019-04-30 NOTE — Assessment & Plan Note (Signed)
She continues to have difficulties with insomnia She has prescription anxiolytics to take as needed

## 2019-04-30 NOTE — Assessment & Plan Note (Signed)
I reviewed CT imaging with the patient in great detail Copies of the report were given to the patient Even though her tumor marker was recently elevated, she has no evidence of disease seen on CT imaging I recommend her to continue on olaparib and I will continue to see her on the monthly basis for further follow-up and supportive care

## 2019-04-30 NOTE — Assessment & Plan Note (Signed)
Renal function is stable She will continue aggressive blood pressure management

## 2019-04-30 NOTE — Progress Notes (Signed)
[place] OFFICE PROGRESS NOTE  Patient Care Team: Patient, No Pcp Per as PCP - General ([place]) Ileana Roup, MD as Consulting Physician (General Surgery)  ASSESSMENT & PLAN:  Fallopian tube cancer, carcinoma ([place]) I reviewed CT imaging with the patient in great detail Copies of the report were given to the patient Even though her tumor marker was recently elevated, she has no evidence of disease seen on CT imaging I recommend her to continue on olaparib and I will continue to see her on the monthly basis for further follow-up and supportive care  CKD (chronic kidney disease), stage III ([place]) Renal function is stable She will continue aggressive blood pressure management  Essential hypertension Her blood pressure is returning back to normal and she have almost no further proteinuria I have discontinued hydralazine recently I plan to continue tapering off some of her blood pressure medications in the future  Abdominal pain She continues to have intermittent abdominal pain No evidence of bowel obstruction or residual disease is seen on CT I recommend her to continue on oxycodone as needed  Insomnia disorder She continues to have difficulties with insomnia She has prescription anxiolytics to take as needed  Other fatigue She continues to have excessive fatigue Her recent TSH was within normal limits I recommend graduated exercise as tolerated  Goals of care, counseling/discussion I have extensive goals of care discussion with the patient and family They are pleased with the test results and are willing to continue on olaparib for now   No orders of the defined types were placed in this encounter.   INTERVAL HISTORY: Please see below for problem oriented charting. She returns for further follow-up Her son and daughter are available to consult over the telephone She continues to have nonspecific symptoms such as excessive fatigue,  occasional abdominal discomfort and excessive insomnia She is attributing some of the symptoms to her pain medicine Whenever she does not take her pain medicine, she does not sleep that well and not able to function No recent infection, fever or chills She denies nausea  SUMMARY OF ONCOLOGIC HISTORY: Oncology History Overview Note  Neg genetics.  HRD test was done on peritoneal fluid from 07/18/2018: HRD +   Fallopian tube cancer, carcinoma ([place])  06/18/2016 Tumor Marker   Patient's tumor was tested for the following markers: CA-125 Results of the tumor marker test revealed 298.   06/23/2016 PET scan   Diffuse hypermetabolic peritoneal carcinomatosis in the abdomen and pelvis, internal mammary adenopathy in the chest, hypermetabolic right paracardiac/diaphragmatic LN, bilateral pleural effusion and moderate ascites   06/24/2016 Procedure   Therapeutic paracentesis   06/24/2016 Pathology Results   Positive for adenocarcinoma, Mullerian primary   06/28/2016 Procedure   She underwent CT guided biopsy of omentum   06/28/2016 Pathology Results   High grade serous involving the omentum, positive for p53, PAX8, WT1 and p16.   07/01/2016 Procedure   Findings/impression:   1. Sonographic evaluation of the right internal jugular vein confirms patency. Sonography was required to gain central venous access. An image documenting patency and needle access was saved.   2. Successful placement of right internal jugular vein subcutaneous Mediport as described. Spot film shows the catheter tip at the cavoatrial junction. No pneumothorax. The Mediport aspirates and flushes normally.   07/02/2016 - 08/12/2016 Chemotherapy   The patient had 3 cycles of carboplatin and taxol chemotherapy treatment.     08/29/2016 Imaging   Ct scan of chest, abdomen and pelvis  showed marked improvement and resolution of thoracic adenopathy   09/08/2016 Surgery   She underwent interval debulking surgery with TAH/BSO and  infracolic omentectomy by Dr. Wynelle Cleveland   09/08/2016 Pathology Results   Right tube: scant serous carcinoma. No viable tumor. Left tube and ovary: normal. Omentum: low volume serous carcinoma with few psamomma bodies.   10/07/2016 - 11/18/2016 Chemotherapy   The patient had 3 more cycles of carboplatin and taxol for chemotherapy treatment.     02/07/2017 Imaging   Impression:  1. Colonic diverticulosis without acute diverticulitis. 2. Stable calcification right kidney without hydronephrosis. 3. No acute process or significant interval change has occurred since August 29, 2016.   05/09/2018 Imaging   Impression:  Findings are most suspicious for acute sigmoid colonic diverticulitis with intraloop abscess formation.   05/14/2018 Imaging   Impression: The pelvic fluid collection contiguous with sigmoid colonic diverticulitis changes has not significant change since May 09, 2018. No new fluid collections are seen   06/28/2018 Imaging   IMPRESSION: Changes consistent with diverticulitis with free fluid within the pelvis. No focal contained abscess is seen. A previously noted fluid collection deep within the pelvis has resolved in the interval.   Stable nonobstructing right renal stone.  The remainder of the exam is stable from the prior study.    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 There is omental caking as well as stranding within various areas of the peritoneal fat. There is also wall thickening of the sigmoid colon with adjacent stranding. This finding can also be related to peritoneal carcinomatosis and implants although an inflammatory process of the sigmoid colon is not excluded.   Right nephrolithiasis.  Ileus pattern.   07/16/2018 Imaging   Impression:   Findings of peritoneal carcinomatosis, rapidly  progressive. Not present in July 2019, progressed when compared to September 13.  Multiple bowel loops are distorted but no distention or transition zone to suggest a component of active obstruction.   07/18/2018 Procedure   Findings/impression:  1. Sonographic evaluation of ascites. Sonography was required to gain access to ascites. An image documenting ascites was saved. 2. Successful ultrasound guided paracentesis using a temporary peritoneal drainage catheter inserted in the right upper quadrant as described yielding 600cc of ascites.    07/20/2018 Echocardiogram   General:  The patient was in normal sinus rhythm.  Left Ventricle:  Left ventricular ejection fraction was normal, estimated in the range of 60 to 65%. The left ventricular cavity size appears normal. The left ventricular wall thickness appears normal. The left ventricle is thickened in a fashion  consistent with mild concentric hypertrophy. Doppler tissue velocities and mitral inflow profile are consistent with Stage I diastolic dysfunction. The calculated ejection fraction is 68.1 %.  Right Ventricle:  The right ventricle appears normal in size and function.  Left Atrium:  The left atrium appears normal.  Right Atrium:  The right atrium appears normal.  Aortic Valve:  The structure of the aortic valve is tricuspid. There is evidence of trivial (trace) aortic regurgitation.  Mitral Valve:  There is no evidence of mitral regurgitation. The mitral valve appears normal in structure.  Pulmonic Valve:  There is evidence of trace (trivial) pulmonic regurgitation. The pulmonic valve appears normal in structure.  Tricuspid Valve:  There is evidence of trace (trivial) tricuspid regurgitation. The tricuspid valve appears normal in structure.  Pericardium:  There is no evidence of pericardial effusion.  Aorta:  The visualized portions of the aorta (ascending aorta, aortic root, and aortic arch) appear normal.  Pulmonic Artery:   Unable to obtain RVSP due to insufficient tricuspid regurgitation jet.  Conclusions: Left ventricular ejection fraction was normal, estimated in the range of 60 to 65%. The left ventricular cavity size appears normal. The left ventricular wall thickness appears normal. The left ventricle is thickened in a fashion consistent with mild concentric hypertrophy. Doppler tissue velocities and mitral inflow profile are consistent with Stage I diastolic dysfunction. The calculated ejection fraction is 68.1%.There is evidence of trivial (trace) aortic regurgitation.There is evidence of trace (trivial) pulmonic regurgitation.There is evidence of trace (trivial) tricuspid regurgitation.There is no evidence of pericardial effusion.Unable to obtain RVSP due to insufficient tricuspid regurgitation jet.   07/23/2018 - 12/25/2018 Chemotherapy   The patient had carboplatin, doxil and Avastin for chemotherapy treatment x 6 cycles   08/01/2018 Imaging   Ct head showed no injuries.   08/01/2018 Procedure   Findings/impression:  1. Sonographic evaluation of ascites. Sonography was required to gain access to ascites. An image documenting ascites was saved. 2. Successful ultrasound guided paracentesis using a temporary peritoneal drainage catheter inserted in the right upper quadrant as described yielding 400cc of ascites.    08/06/2018 Tumor Marker   Patient's tumor was tested for the following markers: CA-125 Results of the tumor marker test revealed 373   08/20/2018 Tumor Marker   Patient's tumor was tested for the following markers: CA-125 Results of the tumor marker test revealed 51.   10/03/2018 Genetic Testing   Patient has genetic testing done for genetics testing using her saliva. 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  Liver and spleen homogeneously enhance. No lesion seen.  The adjacent gallbladder  is surgically absent.  The adrenal glands, kidneys, pancreas are normal.  Pelvic contents are remarkable for an absent uterus.  The mesenteric stranding has nearly completely resolved. Minimal reticular prominence throughout the pelvis.  No residual ascites.  No bowel wall thickening or. No dilated loops or transition zone.  The appendix is not seen. But no inflammatory changes in the right lower quadrant.  Osseous structures are intact.   11/27/2018 Imaging   Ct scan abdomen and pelvis showed colonic diverticulosis and nonspecific fluid levels in the bowel   12/24/2018 Cancer Staging   Staging form: Ovary, Fallopian Tube, and Primary Peritoneal Carcinoma, AJCC 8th Edition - Pathologic: Stage IVB (rpT3a, pN0, pM1b) - Signed by Heath Lark, MD on 12/24/2018   12/31/2018 Tumor Marker   Patient's tumor was tested for the following markers: CA-125 Results of the tumor marker test revealed 14.4   01/01/2019 Imaging   1. LEFT jugular Port-A-Cath catheter tip projects over the mid SVC. 2. No acute cardiopulmonary disease.    01/04/2019 Echocardiogram   1. The left ventricle has normal systolic function with an ejection fraction of 60-65%. The cavity size was normal. Left ventricular diastolic Doppler parameters are consistent with impaired relaxation.  2. The right ventricle has normal systolic function. The cavity was normal. There is no increase in right ventricular wall thickness.  3. The mitral valve is normal in structure.  4. The tricuspid valve is normal in structure.  5. The aortic valve is normal in structure.  6. The pulmonic valve was normal in structure.  7. There is dilatation  of the ascending aorta measuring 37 mm.    02/13/2019 -  Chemotherapy   The patient is started on Lynparza due to [place]    04/22/2019 Tumor Marker   Patient's tumor was tested for the following markers: CA-125 Results of the tumor marker test revealed 44.8   04/26/2019 Imaging   1. No evidence of  recurrent or metastatic carcinoma within the abdomen or pelvis. 2. Colonic diverticulosis, without radiographic evidence of diverticulitis or other acute findings.   Aortic Atherosclerosis (ICD10-I70.0).     REVIEW OF SYSTEMS:   Constitutional: Denies fevers, chills or abnormal weight loss Eyes: Denies blurriness of vision Ears, nose, mouth, throat, and face: Denies mucositis or sore throat Respiratory: Denies cough, dyspnea or wheezes Cardiovascular: Denies palpitation, chest discomfort or lower extremity swelling Skin: Denies abnormal skin rashes Lymphatics: Denies new lymphadenopathy or easy bruising Neurological:Denies numbness, tingling or new weaknesses Behavioral/Psych: Mood is stable, no new changes  All other systems were reviewed with the patient and are negative.  I have reviewed the past medical history, past surgical history, social history and family history with the patient and they are unchanged from previous note.  ALLERGIES:  is allergic to latex.  MEDICATIONS:  Current Outpatient Medications  Medication Sig Dispense Refill  . Acetaminophen 325 MG CAPS Take 650 mg by mouth every 6 (six) hours as needed.    . ALPRAZolam (XANAX) 0.25 MG tablet Take 0.25 mg by mouth at bedtime as needed.    Marland Kitchen amLODipine (NORVASC) 10 MG tablet Take 10 mg by mouth daily.    . Ascorbic Acid (VITAMIN C PO) Take 500 mg by mouth daily.     Marland Kitchen atenolol (TENORMIN) 25 MG tablet Take 0.5 tablets (12.5 mg total) by mouth daily. 30 tablet 11  . calcium carbonate (TUMS) 500 MG chewable tablet Chew 1 tablet by mouth as needed for indigestion or heartburn.    . Cholecalciferol (VITAMIN D PO) Take 1 tablet by mouth daily. D3 1000units    . escitalopram (LEXAPRO) 10 MG tablet Take 1 tablet (10 mg total) by mouth daily. 90 tablet 1  . fluticasone (FLONASE) 50 MCG/ACT nasal spray Place 1 spray into both nostrils daily.    . hydrochlorothiazide (MICROZIDE) 12.5 MG capsule Take 12.5 mg by mouth daily.     Marland Kitchen levothyroxine (SYNTHROID) 50 MCG tablet Take 0.5 tablets (25 mcg total) by mouth daily before breakfast. 30 tablet 3  . losartan (COZAAR) 100 MG tablet Take 1 tablet (100 mg total) by mouth daily. 30 tablet 0  . Multiple Vitamin (MULTIVITAMIN) tablet Take 1 tablet by mouth daily.    . Multiple Vitamins-Minerals (VITAMIN D3 COMPLETE PO) Take 1,000 Units by mouth daily. 1000 IU Vitamin D3    . olaparib (LYNPARZA) 100 MG tablet Take 1 tablet (100 mg total) by mouth 2 (two) times daily. Swallow whole. May take with food to decrease nausea and vomiting. 60 tablet 11  . ondansetron (ZOFRAN) 8 MG tablet Take 8 mg by mouth every 8 (eight) hours as needed for nausea or vomiting.    Marland Kitchen oxyCODONE-acetaminophen (PERCOCET/ROXICET) 5-325 MG tablet Take 1 tablet by mouth every 6 (six) hours as needed for severe pain. 60 tablet 0  . polyethylene glycol (MIRALAX / GLYCOLAX) 17 g packet Take 17 g by mouth 2 (two) times daily.     No current facility-administered medications for this visit.     PHYSICAL EXAMINATION: ECOG PERFORMANCE STATUS: 1 - Symptomatic but completely ambulatory  Vitals:   04/29/19 1432  BP: 133/62  Pulse: 69  Resp: 18  Temp: 98.5 F (36.9 C)  SpO2: 94%   Filed Weights   04/29/19 1432  Weight: 146 lb 3.2 oz (66.3 kg)    GENERAL:alert, no distress and comfortable Musculoskeletal:no cyanosis of digits and no clubbing  NEURO: alert & oriented x 3 with fluent speech, no focal motor/sensory deficits  LABORATORY DATA:  I have reviewed the data as listed    Component Value Date/Time   NA 134 (L) 04/19/2019 1309   NA 139 06/27/2018 1649   K 4.7 04/19/2019 1309   CL 102 04/19/2019 1309   CO2 24 04/19/2019 1309   GLUCOSE 106 (H) 04/19/2019 1309   BUN 41 (H) 04/19/2019 1309   BUN 17 06/27/2018 1649   CREATININE 1.35 (H) 04/19/2019 1309   CALCIUM 9.1 04/19/2019 1309   PROT 7.3 04/19/2019 1309   PROT 6.8 05/24/2018 0851   ALBUMIN 3.8 04/19/2019 1309   ALBUMIN 4.2 05/24/2018  0851   AST 19 04/19/2019 1309   ALT 12 04/19/2019 1309   ALKPHOS 67 04/19/2019 1309   BILITOT 0.3 04/19/2019 1309   BILITOT 0.2 05/24/2018 0851   GFRNONAA 36 (L) 04/19/2019 1309   GFRAA 42 (L) 04/19/2019 1309    No results found for: SPEP, UPEP  Lab Results  Component Value Date   WBC 4.5 04/19/2019   NEUTROABS 2.8 04/19/2019   HGB 10.0 (L) 04/19/2019   HCT 30.4 (L) 04/19/2019   MCV 109.7 (H) 04/19/2019   PLT 139 (L) 04/19/2019      Chemistry      Component Value Date/Time   NA 134 (L) 04/19/2019 1309   NA 139 06/27/2018 1649   K 4.7 04/19/2019 1309   CL 102 04/19/2019 1309   CO2 24 04/19/2019 1309   BUN 41 (H) 04/19/2019 1309   BUN 17 06/27/2018 1649   CREATININE 1.35 (H) 04/19/2019 1309      Component Value Date/Time   CALCIUM 9.1 04/19/2019 1309   ALKPHOS 67 04/19/2019 1309   AST 19 04/19/2019 1309   ALT 12 04/19/2019 1309   BILITOT 0.3 04/19/2019 1309   BILITOT 0.2 05/24/2018 0851       RADIOGRAPHIC STUDIES: I have reviewed imaging studies with the patient I have personally reviewed the radiological images as listed and agreed with the findings in the report. Ct Abdomen Pelvis W Contrast  Result Date: 04/27/2019 CLINICAL DATA:  Follow-up ovarian/fallopian tube carcinoma. Undergoing chemotherapy. EXAM: CT ABDOMEN AND PELVIS WITH CONTRAST TECHNIQUE: Multidetector CT imaging of the abdomen and pelvis was performed using the standard protocol following bolus administration of intravenous contrast. CONTRAST:  32m OMNIPAQUE IOHEXOL 300 MG/ML  [facility]  [facility] FINDINGS: Lower Chest: No acute findings. Hepatobiliary: No hepatic masses identified. Prior cholecystectomy. No evidence of biliary obstruction. Pancreas:  No mass or inflammatory changes. Spleen: Within normal limits in size and appearance. Adrenals/Urinary Tract: No masses identified. No evidence of hydronephrosis. Stomach/Bowel: No evidence of obstruction,  inflammatory process or abnormal fluid collections. Diverticulosis is seen mainly involving the descending and sigmoid colon, however there is no evidence of diverticulitis. Vascular/Lymphatic: No pathologically enlarged lymph nodes. No abdominal aortic aneurysm. Aortic atherosclerosis. Reproductive: Prior hysterectomy noted. Adnexal regions are unremarkable in appearance. No evidence of peritoneal thickening, omental caking, or ascites. Other:  None. Musculoskeletal:  No suspicious bone lesions identified. IMPRESSION: 1. No evidence of recurrent or metastatic carcinoma within the abdomen or pelvis. 2. Colonic diverticulosis, without radiographic  evidence of diverticulitis or other acute findings. Aortic Atherosclerosis (ICD10-I70.0). Electronically Signed   By: Marlaine Hind M.D.   On: 04/27/2019 15:40    All questions were answered. The patient knows to call the clinic with any problems, questions or concerns. No barriers to learning was detected.  I spent 30 minutes counseling the patient face to face. The total time spent in the appointment was 40 minutes and more than 50% was on counseling and review of test results  Heath Lark, MD 04/30/2019 9:49 AM

## 2019-05-10 ENCOUNTER — Telehealth: Payer: Self-pay

## 2019-05-10 NOTE — Telephone Encounter (Signed)
Oral Oncology Patient Advocate Encounter   Was successful in securing patient a $5000 grant from [employer] to provide copayment coverage for Lynparza.  This will keep the out of pocket expense at $0.     I called [name] and left him a voicemail.    The billing information is as follows and has been shared with [place].   Member ID: [id number] Group ID: [id number] RxBin: [id number] PCN: [id number] Dates of Eligibility: 05/09/19 through 05/08/20  [place] Patient [place] Phone [phone] Fax [phone] 05/10/2019    2:16 PM

## 2019-05-18 ENCOUNTER — Encounter: Payer: Self-pay | Admitting: Hematology and Oncology

## 2019-05-20 ENCOUNTER — Other Ambulatory Visit: Payer: Self-pay | Admitting: Hematology and Oncology

## 2019-05-20 DIAGNOSIS — C5700 Malignant neoplasm of unspecified fallopian tube: Secondary | ICD-10-CM

## 2019-05-20 MED ORDER — AMLODIPINE BESYLATE 10 MG PO TABS: 10.0000 mg | ORAL_TABLET | Freq: Every day | ORAL | 9 refills | Status: DC

## 2019-05-28 ENCOUNTER — Other Ambulatory Visit: Payer: Self-pay

## 2019-05-28 ENCOUNTER — Inpatient Hospital Stay: Payer: Medicare Other

## 2019-05-28 ENCOUNTER — Inpatient Hospital Stay: Payer: Medicare Other | Attending: Hematology and Oncology | Admitting: Hematology and Oncology

## 2019-05-28 ENCOUNTER — Encounter: Payer: Self-pay | Admitting: Hematology and Oncology

## 2019-05-28 VITALS — BP 145/63 | HR 51 | Temp 98.3°F | Resp 18 | Ht 62.0 in | Wt 143.6 lb

## 2019-05-28 DIAGNOSIS — C5701 Malignant neoplasm of right fallopian tube: Secondary | ICD-10-CM | POA: Diagnosis present

## 2019-05-28 DIAGNOSIS — I129 Hypertensive chronic kidney disease with stage 1 through stage 4 chronic kidney disease, or unspecified chronic kidney disease: Secondary | ICD-10-CM | POA: Diagnosis not present

## 2019-05-28 DIAGNOSIS — N183 Chronic kidney disease, stage 3 unspecified: Secondary | ICD-10-CM

## 2019-05-28 DIAGNOSIS — Z95828 Presence of other vascular implants and grafts: Secondary | ICD-10-CM

## 2019-05-28 DIAGNOSIS — D61818 Other pancytopenia: Secondary | ICD-10-CM

## 2019-05-28 DIAGNOSIS — Z452 Encounter for adjustment and management of vascular access device: Secondary | ICD-10-CM | POA: Diagnosis not present

## 2019-05-28 DIAGNOSIS — D6481 Anemia due to antineoplastic chemotherapy: Secondary | ICD-10-CM

## 2019-05-28 DIAGNOSIS — C786 Secondary malignant neoplasm of retroperitoneum and peritoneum: Secondary | ICD-10-CM | POA: Diagnosis not present

## 2019-05-28 DIAGNOSIS — I10 Essential (primary) hypertension: Secondary | ICD-10-CM

## 2019-05-28 DIAGNOSIS — C5700 Malignant neoplasm of unspecified fallopian tube: Secondary | ICD-10-CM

## 2019-05-28 DIAGNOSIS — D539 Nutritional anemia, unspecified: Secondary | ICD-10-CM

## 2019-05-28 DIAGNOSIS — E039 Hypothyroidism, unspecified: Secondary | ICD-10-CM

## 2019-05-28 LAB — CBC WITH DIFFERENTIAL/PLATELET
Abs Immature Granulocytes: 0.02 10*3/uL (ref 0.00–0.07)
Basophils Absolute: 0 10*3/uL (ref 0.0–0.1)
Basophils Relative: 0 %
Eosinophils Absolute: 0.2 10*3/uL (ref 0.0–0.5)
Eosinophils Relative: 3 %
HCT: 28.7 % — ABNORMAL LOW (ref 36.0–46.0)
Hemoglobin: 9.4 g/dL — ABNORMAL LOW (ref 12.0–15.0)
Immature Granulocytes: 0 %
Lymphocytes Relative: 33 %
Lymphs Abs: 1.5 10*3/uL (ref 0.7–4.0)
MCH: 36.7 pg — ABNORMAL HIGH (ref 26.0–34.0)
MCHC: 32.8 g/dL (ref 30.0–36.0)
MCV: 112.1 fL — ABNORMAL HIGH (ref 80.0–100.0)
Monocytes Absolute: 0.5 10*3/uL (ref 0.1–1.0)
Monocytes Relative: 12 %
Neutro Abs: 2.4 10*3/uL (ref 1.7–7.7)
Neutrophils Relative %: 52 %
Platelets: 132 10*3/uL — ABNORMAL LOW (ref 150–400)
RBC: 2.56 MIL/uL — ABNORMAL LOW (ref 3.87–5.11)
RDW: 14 % (ref 11.5–15.5)
WBC: 4.6 10*3/uL (ref 4.0–10.5)
nRBC: 0 % (ref 0.0–0.2)

## 2019-05-28 LAB — COMPREHENSIVE METABOLIC PANEL
ALT: 10 U/L (ref 0–44)
AST: 18 U/L (ref 15–41)
Albumin: 3.9 g/dL (ref 3.5–5.0)
Alkaline Phosphatase: 59 U/L (ref 38–126)
Anion gap: 8 (ref 5–15)
BUN: 32 mg/dL — ABNORMAL HIGH (ref 8–23)
CO2: 24 mmol/L (ref 22–32)
Calcium: 9.4 mg/dL (ref 8.9–10.3)
Chloride: 107 mmol/L (ref 98–111)
Creatinine, Ser: 1.19 mg/dL — ABNORMAL HIGH (ref 0.44–1.00)
GFR calc Af Amer: 49 mL/min — ABNORMAL LOW (ref >60)
GFR calc non Af Amer: 42 mL/min — ABNORMAL LOW (ref >60)
Glucose, Bld: 99 mg/dL (ref 70–99)
Potassium: 4.6 mmol/L (ref 3.5–5.1)
Sodium: 139 mmol/L (ref 135–145)
Total Bilirubin: 0.3 mg/dL (ref 0.3–1.2)
Total Protein: 7.1 g/dL (ref 6.5–8.1)

## 2019-05-28 LAB — TSH: TSH: 2.027 u[IU]/mL (ref 0.308–3.960)

## 2019-05-28 MED ORDER — SODIUM CHLORIDE 0.9% FLUSH
10.0000 mL | Freq: Once | INTRAVENOUS | Status: AC
  Administered 2019-05-28: 10 mL
  Filled 2019-05-28: qty 10

## 2019-05-28 MED ORDER — HEPARIN SOD (PORK) LOCK FLUSH 100 UNIT/ML IV SOLN
500.0000 [IU] | Freq: Once | INTRAVENOUS | Status: AC
  Administered 2019-05-28: 500 [IU]
  Filled 2019-05-28: qty 5

## 2019-05-28 NOTE — Assessment & Plan Note (Signed)
Her blood pressure is returning back to normal and she had almost no recent proteinuria I plan to continue tapering off some of her blood pressure medications in the future

## 2019-05-28 NOTE — Assessment & Plan Note (Signed)
Renal function is stable She will continue aggressive blood pressure management

## 2019-05-28 NOTE — Assessment & Plan Note (Signed)
Pancytopenia is stable She is not symptomatic She will continue reduced dose olaparib for now

## 2019-05-28 NOTE — Assessment & Plan Note (Signed)
She is doing very well with no pain or GI symptoms She tolerated treatment very well I plan to see her back in about 6 weeks We will call her tomorrow once tumor marker result is available Her examination today is benign

## 2019-05-28 NOTE — Progress Notes (Signed)
[facility] PROGRESS NOTE  Patient Care Team: Patient, No Pcp Per as PCP - General ([place]) Ileana Roup, MD as Consulting Physician (General Surgery)  ASSESSMENT & PLAN:  Fallopian tube cancer, carcinoma ([place]) She is doing very well with no pain or GI symptoms She tolerated treatment very well I plan to see her back in about 6 weeks We will call her tomorrow once tumor marker result is available Her examination today is benign  Pancytopenia, acquired ([place]) Pancytopenia is stable She is not symptomatic She will continue reduced dose olaparib for now  Essential hypertension Her blood pressure is returning back to normal and she had almost no recent proteinuria I plan to continue tapering off some of her blood pressure medications in the future  CKD (chronic kidney disease), stage III ([place]) Renal function is stable She will continue aggressive blood pressure management   No orders of the defined types were placed in this encounter.   INTERVAL HISTORY: Please see below for problem oriented charting. She returns for further follow-up Her son and daughter are available over the phone to collaborate her history She had one brief episode of nausea with recent car trip Since she returns from vacation, she felt excellent She is eating well No changes in bowel habits She denies abdominal pain and have stopped taking her Percocet Her blood pressure control at home is satisfactory  SUMMARY OF ONCOLOGIC HISTORY: Oncology History Overview Note  Neg genetics.  HRD test was done on peritoneal fluid from 07/18/2018: HRD +   Fallopian tube cancer, carcinoma ([place])  06/18/2016 Tumor Marker   Patient's tumor was tested for the following markers: CA-125 Results of the tumor marker test revealed 298.   06/23/2016 PET scan   Diffuse hypermetabolic peritoneal carcinomatosis in the abdomen and pelvis, internal mammary adenopathy in the chest,  hypermetabolic right paracardiac/diaphragmatic LN, bilateral pleural effusion and moderate ascites   06/24/2016 Procedure   Therapeutic paracentesis   06/24/2016 Pathology Results   Positive for adenocarcinoma, [place] primary   06/28/2016 Procedure   She underwent CT guided biopsy of omentum   06/28/2016 Pathology Results   High grade serous involving the omentum, positive for p53, PAX8, WT1 and p16.   07/01/2016 Procedure   Findings/impression:   1. Sonographic evaluation of the right internal jugular vein confirms patency. Sonography was required to gain central venous access. An image documenting patency and needle access was saved.   2. Successful placement of right internal jugular vein subcutaneous Mediport as described. Spot film shows the catheter tip at the cavoatrial junction. No pneumothorax. The Mediport aspirates and flushes normally.   07/02/2016 - 08/12/2016 Chemotherapy   The patient had 3 cycles of carboplatin and taxol chemotherapy treatment.     08/29/2016 Imaging   Ct scan of chest, abdomen and pelvis showed marked improvement and resolution of thoracic adenopathy   09/08/2016 Surgery   She underwent interval debulking surgery with TAH/BSO and infracolic omentectomy by Dr. Wynelle Cleveland   09/08/2016 Pathology Results   Right tube: scant serous carcinoma. No viable tumor. Left tube and ovary: normal. Omentum: low volume serous carcinoma with few psamomma bodies.   10/07/2016 - 11/18/2016 Chemotherapy   The patient had 3 more cycles of carboplatin and taxol for chemotherapy treatment.     02/07/2017 Imaging   Impression:  1. Colonic diverticulosis without acute diverticulitis. 2. Stable calcification right kidney without hydronephrosis. 3. No acute process or significant interval change has occurred since August 29, 2016.  05/09/2018 Imaging   Impression:  Findings are most suspicious for acute sigmoid colonic diverticulitis with intraloop abscess  formation.   05/14/2018 Imaging   Impression: The pelvic fluid collection contiguous with sigmoid colonic diverticulitis changes has not significant change since May 09, 2018. No new fluid collections are seen   06/28/2018 Imaging   IMPRESSION: Changes consistent with diverticulitis with free fluid within the pelvis. No focal contained abscess is seen. A previously noted fluid collection deep within the pelvis has resolved in the interval.   Stable nonobstructing right renal stone.  The remainder of the exam is stable from the prior study.    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 There is omental caking as well as stranding within various areas of the peritoneal fat. There is also wall thickening of the sigmoid colon with adjacent stranding. This finding can also be related to peritoneal carcinomatosis and implants although an inflammatory process of the sigmoid colon is not excluded.   Right nephrolithiasis.  Ileus pattern.   07/16/2018 Imaging   Impression:   Findings of peritoneal carcinomatosis, rapidly progressive. Not present in July 2019, progressed when compared to September 13.  Multiple bowel loops are distorted but no distention or transition zone to suggest a component of active obstruction.   07/18/2018 Procedure   Findings/impression:  1. Sonographic evaluation of ascites. Sonography was required to gain access to ascites. An image documenting ascites was saved. 2. Successful ultrasound guided paracentesis using a temporary peritoneal drainage catheter inserted in the right upper quadrant as described yielding 600cc of ascites.    07/20/2018 Echocardiogram   General:  The patient was in normal sinus rhythm.  Left Ventricle:  Left ventricular ejection fraction was normal, estimated in the range of 60  to 65%. The left ventricular cavity size appears normal. The left ventricular wall thickness appears normal. The left ventricle is thickened in a fashion  consistent with mild concentric hypertrophy. Doppler tissue velocities and mitral inflow profile are consistent with Stage I diastolic dysfunction. The calculated ejection fraction is 68.1 %.  Right Ventricle:  The right ventricle appears normal in size and function.  Left Atrium:  The left atrium appears normal.  Right Atrium:  The right atrium appears normal.  Aortic Valve:  The structure of the aortic valve is tricuspid. There is evidence of trivial (trace) aortic regurgitation.  Mitral Valve:  There is no evidence of mitral regurgitation. The mitral valve appears normal in structure.  Pulmonic Valve:  There is evidence of trace (trivial) pulmonic regurgitation. The pulmonic valve appears normal in structure.  Tricuspid Valve:  There is evidence of trace (trivial) tricuspid regurgitation. The tricuspid valve appears normal in structure.  Pericardium:  There is no evidence of pericardial effusion.  Aorta:  The visualized portions of the aorta (ascending aorta, aortic root, and aortic arch) appear normal.  Pulmonic Artery:  Unable to obtain RVSP due to insufficient tricuspid regurgitation jet.  Conclusions: Left ventricular ejection fraction was normal, estimated in the range of 60 to 65%. The left ventricular cavity size appears normal. The left ventricular wall thickness appears normal. The left ventricle is thickened in a fashion consistent with mild concentric hypertrophy. Doppler tissue velocities and mitral inflow profile are consistent with Stage I diastolic dysfunction. The calculated ejection fraction is 68.1%.There is evidence of trivial (trace) aortic regurgitation.There is evidence of trace (trivial)  pulmonic regurgitation.There is evidence of trace (trivial) tricuspid regurgitation.There is no evidence of pericardial  effusion.Unable to obtain RVSP due to insufficient tricuspid regurgitation jet.   07/23/2018 - 12/25/2018 Chemotherapy   The patient had carboplatin, doxil and Avastin for chemotherapy treatment x 6 cycles   08/01/2018 Imaging   Ct head showed no injuries.   08/01/2018 Procedure   Findings/impression:  1. Sonographic evaluation of ascites. Sonography was required to gain access to ascites. An image documenting ascites was saved. 2. Successful ultrasound guided paracentesis using a temporary peritoneal drainage catheter inserted in the right upper quadrant as described yielding 400cc of ascites.    08/06/2018 Tumor Marker   Patient's tumor was tested for the following markers: CA-125 Results of the tumor marker test revealed 373   08/20/2018 Tumor Marker   Patient's tumor was tested for the following markers: CA-125 Results of the tumor marker test revealed 51.   10/03/2018 Genetic Testing   Patient has genetic testing done for genetics testing using her saliva. 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  Liver and spleen homogeneously enhance. No lesion seen.  The adjacent gallbladder is surgically absent.  The adrenal glands, kidneys, pancreas are normal.  Pelvic contents are remarkable for an absent uterus.  The mesenteric stranding has nearly completely resolved. Minimal reticular prominence throughout the pelvis.  No residual ascites.  No bowel wall thickening or. No dilated loops or transition zone.  The appendix is not seen. But no inflammatory changes in the right lower quadrant.  Osseous structures are intact.   11/27/2018 Imaging   Ct scan abdomen and pelvis showed colonic diverticulosis and nonspecific fluid levels in the bowel   12/24/2018 Cancer Staging   Staging form: Ovary, Fallopian Tube, and Primary Peritoneal Carcinoma, AJCC 8th Edition - Pathologic: Stage  IVB (rpT3a, pN0, pM1b) - Signed by Heath Lark, MD on 12/24/2018   12/31/2018 Tumor Marker   Patient's tumor was tested for the following markers: CA-125 Results of the tumor marker test revealed 14.4   01/01/2019 Imaging   1. LEFT jugular Port-A-Cath catheter tip projects over the mid SVC. 2. No acute cardiopulmonary disease.    01/04/2019 Echocardiogram   1. The left ventricle has normal systolic function with an ejection fraction of 60-65%. The cavity size was normal. Left ventricular diastolic Doppler parameters are consistent with impaired relaxation.  2. The right ventricle has normal systolic function. The cavity was normal. There is no increase in right ventricular wall thickness.  3. The mitral valve is normal in structure.  4. The tricuspid valve is normal in structure.  5. The aortic valve is normal in structure.  6. The pulmonic valve was normal in structure.  7. 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    02/13/2019 -  Chemotherapy   The patient is started on Lynparza due to [place]    04/22/2019 Tumor Marker   Patient's tumor was tested for the following markers: CA-125 Results of the tumor marker test revealed 44.8   04/26/2019 Imaging   1. No evidence of recurrent or metastatic carcinoma within the abdomen or pelvis. 2. Colonic diverticulosis, without radiographic evidence of diverticulitis or other acute findings.   Aortic Atherosclerosis (ICD10-I70.0).     REVIEW OF SYSTEMS:   Constitutional: Denies fevers, chills or abnormal weight loss Eyes: Denies blurriness of vision Ears, nose, mouth, throat, and face: Denies mucositis  or sore throat Respiratory: Denies cough, dyspnea or wheezes Cardiovascular: Denies palpitation, chest discomfort or lower extremity swelling Skin: Denies abnormal skin rashes Lymphatics: Denies new lymphadenopathy or easy bruising Neurological:Denies numbness, tingling or new weaknesses Behavioral/Psych: Mood is stable, no  new changes  All other systems were reviewed with the patient and are negative.  I have reviewed the past medical history, past surgical history, social history and family history with the patient and they are unchanged from previous note.  ALLERGIES:  is allergic to latex.  MEDICATIONS:  Current Outpatient Medications  Medication Sig Dispense Refill  . Acetaminophen 325 MG CAPS Take 650 mg by mouth every 6 (six) hours as needed.    . ALPRAZolam (XANAX) 0.25 MG tablet Take 0.25 mg by mouth at bedtime as needed.    Marland Kitchen amLODipine (NORVASC) 10 MG tablet Take 1 tablet (10 mg total) by mouth daily. 90 tablet 9  . Ascorbic Acid (VITAMIN C PO) Take 500 mg by mouth daily.     Marland Kitchen atenolol (TENORMIN) 25 MG tablet Take 0.5 tablets (12.5 mg total) by mouth daily. 30 tablet 11  . calcium carbonate (TUMS) 500 MG chewable tablet Chew 1 tablet by mouth as needed for indigestion or heartburn.    . Cholecalciferol (VITAMIN D PO) Take 1 tablet by mouth daily. D3 1000units    . escitalopram (LEXAPRO) 10 MG tablet Take 1 tablet (10 mg total) by mouth daily. 90 tablet 1  . fluticasone (FLONASE) 50 MCG/ACT nasal spray Place 1 spray into both nostrils daily.    . hydrochlorothiazide (MICROZIDE) 12.5 MG capsule Take 12.5 mg by mouth daily.    Marland Kitchen levothyroxine (SYNTHROID) 50 MCG tablet Take 0.5 tablets (25 mcg total) by mouth daily before breakfast. 30 tablet 3  . losartan (COZAAR) 100 MG tablet Take 1 tablet (100 mg total) by mouth daily. 30 tablet 0  . Multiple Vitamin (MULTIVITAMIN) tablet Take 1 tablet by mouth daily.    . Multiple Vitamins-Minerals (VITAMIN D3 COMPLETE PO) Take 1,000 Units by mouth daily. 1000 IU Vitamin D3    . olaparib (LYNPARZA) 100 MG tablet Take 1 tablet (100 mg total) by mouth 2 (two) times daily. Swallow whole. May take with food to decrease nausea and vomiting. 60 tablet 11  . ondansetron (ZOFRAN) 8 MG tablet Take 8 mg by mouth every 8 (eight) hours as needed for nausea or vomiting.    Marland Kitchen  oxyCODONE-acetaminophen (PERCOCET/ROXICET) 5-325 MG tablet Take 1 tablet by mouth every 6 (six) hours as needed for severe pain. 60 tablet 0  . polyethylene glycol (MIRALAX / GLYCOLAX) 17 g packet Take 17 g by mouth 2 (two) times daily.     No current facility-administered medications for this visit.     PHYSICAL EXAMINATION: ECOG PERFORMANCE STATUS: 0 - Asymptomatic  Vitals:   05/28/19 1129  BP: (!) 145/63  Pulse: (!) 51  Resp: 18  Temp: 98.3 F (36.8 C)  SpO2: 97%   Filed Weights   05/28/19 1129  Weight: 143 lb 9.6 oz (65.1 kg)    GENERAL:alert, no distress and comfortable SKIN: skin color, texture, turgor are normal, no rashes or significant lesions EYES: normal, Conjunctiva are pink and non-injected, sclera clear OROPHARYNX:no exudate, no erythema and lips, buccal mucosa, and tongue normal  NECK: supple, thyroid normal size, non-tender, without nodularity LYMPH:  no palpable lymphadenopathy in the cervical, axillary or inguinal LUNGS: clear to auscultation and percussion with normal breathing effort HEART: regular rate & rhythm and no murmurs and no  lower extremity edema ABDOMEN:abdomen soft, non-tender and normal bowel sounds Musculoskeletal:no cyanosis of digits and no clubbing  NEURO: alert & oriented x 3 with fluent speech, no focal motor/sensory deficits  LABORATORY DATA:  I have reviewed the data as listed    Component Value Date/Time   NA 139 05/28/2019 1112   NA 139 06/27/2018 1649   K 4.6 05/28/2019 1112   CL 107 05/28/2019 1112   CO2 24 05/28/2019 1112   GLUCOSE 99 05/28/2019 1112   BUN 32 (H) 05/28/2019 1112   BUN 17 06/27/2018 1649   CREATININE 1.19 (H) 05/28/2019 1112   CALCIUM 9.4 05/28/2019 1112   PROT 7.1 05/28/2019 1112   PROT 6.8 05/24/2018 0851   ALBUMIN 3.9 05/28/2019 1112   ALBUMIN 4.2 05/24/2018 0851   AST 18 05/28/2019 1112   ALT 10 05/28/2019 1112   ALKPHOS 59 05/28/2019 1112   BILITOT 0.3 05/28/2019 1112   BILITOT 0.2 05/24/2018  0851   GFRNONAA 42 (L) 05/28/2019 1112   GFRAA 49 (L) 05/28/2019 1112    No results found for: SPEP, UPEP  Lab Results  Component Value Date   WBC 4.6 05/28/2019   NEUTROABS 2.4 05/28/2019   HGB 9.4 (L) 05/28/2019   HCT 28.7 (L) 05/28/2019   MCV 112.1 (H) 05/28/2019   PLT 132 (L) 05/28/2019      Chemistry      Component Value Date/Time   NA 139 05/28/2019 1112   NA 139 06/27/2018 1649   K 4.6 05/28/2019 1112   CL 107 05/28/2019 1112   CO2 24 05/28/2019 1112   BUN 32 (H) 05/28/2019 1112   BUN 17 06/27/2018 1649   CREATININE 1.19 (H) 05/28/2019 1112      Component Value Date/Time   CALCIUM 9.4 05/28/2019 1112   ALKPHOS 59 05/28/2019 1112   AST 18 05/28/2019 1112   ALT 10 05/28/2019 1112   BILITOT 0.3 05/28/2019 1112   BILITOT 0.2 05/24/2018 0851       All questions were answered. The patient knows to call the clinic with any problems, questions or concerns. No barriers to learning was detected.  I spent 15 minutes counseling the patient face to face. The total time spent in the appointment was 20 minutes and more than 50% was on counseling and review of test results  Heath Lark, MD 05/28/2019 12:57 PM

## 2019-05-29 ENCOUNTER — Telehealth: Payer: Self-pay | Admitting: Hematology and Oncology

## 2019-05-29 ENCOUNTER — Encounter: Payer: Self-pay | Admitting: Hematology and Oncology

## 2019-05-29 ENCOUNTER — Telehealth: Payer: Self-pay | Admitting: *Deleted

## 2019-05-29 LAB — CA 125: Cancer Antigen (CA) 125: 104 U/mL — ABNORMAL HIGH (ref 0.0–38.1)

## 2019-05-29 NOTE — Telephone Encounter (Signed)
Telephone call to patients son. Advised lab results as directed below. Son is disappointed in results and states he wished they were congruent with the CT Scan results. Son agrees with plan to continue monitoring and will notify this office with any questions or concerns in the interm.

## 2019-05-29 NOTE — Telephone Encounter (Signed)
-----   Message from Lisa Lark, MD sent at 05/29/2019  8:00 AM EDT ----- Regarding: CA-125 Pls call son, [name] Her Ca-125 came back high but she had no symptoms yesterday and CT last month was negative Plan is to continue monitoring Probably repeat another CT in September Scheduling msg sent for 6 weeks

## 2019-05-29 NOTE — Telephone Encounter (Signed)
I talk with patient son regarding schedule  °

## 2019-06-11 ENCOUNTER — Other Ambulatory Visit: Payer: Self-pay | Admitting: Hematology and Oncology

## 2019-06-11 ENCOUNTER — Encounter: Payer: Self-pay | Admitting: Hematology and Oncology

## 2019-06-11 DIAGNOSIS — C5700 Malignant neoplasm of unspecified fallopian tube: Secondary | ICD-10-CM

## 2019-06-11 MED ORDER — OXYCODONE-ACETAMINOPHEN 5-325 MG PO TABS: 1.0000 | ORAL_TABLET | Freq: Four times a day (QID) | ORAL | 0 refills | Status: DC | PRN

## 2019-07-03 ENCOUNTER — Encounter: Payer: Self-pay | Admitting: Hematology and Oncology

## 2019-07-04 ENCOUNTER — Telehealth: Payer: Self-pay

## 2019-07-04 ENCOUNTER — Other Ambulatory Visit: Payer: Self-pay | Admitting: Hematology and Oncology

## 2019-07-04 ENCOUNTER — Telehealth: Payer: Self-pay | Admitting: Hematology and Oncology

## 2019-07-04 ENCOUNTER — Inpatient Hospital Stay: Payer: Medicare Other

## 2019-07-04 ENCOUNTER — Inpatient Hospital Stay: Payer: Medicare Other | Admitting: Hematology and Oncology

## 2019-07-04 DIAGNOSIS — C563 Malignant neoplasm of bilateral ovaries: Secondary | ICD-10-CM

## 2019-07-04 DIAGNOSIS — C5700 Malignant neoplasm of unspecified fallopian tube: Secondary | ICD-10-CM

## 2019-07-04 DIAGNOSIS — C561 Malignant neoplasm of right ovary: Secondary | ICD-10-CM

## 2019-07-04 NOTE — Telephone Encounter (Signed)
Called son and given appt times for lab, flush and scan tomorrow. Instructed to drink first bottle of contrast at  8 am and 2nd bottle at 10 am. Prior authorization is still working on authorization for scan. Lisa Moyer verbalized understanding and  will pick up contrast today. Lisa Moyer has enough pain medication to last until next Wednesday.

## 2019-07-04 NOTE — Telephone Encounter (Signed)
I spoke with the son over the telephone The patient is getting significant worsening abdominal pain I am concerned about small bowel obstruction versus cancer recurrence I plan to cancel her appointment today and schedule urgent CT scan tomorrow Her son, [name] expressed verbal understanding

## 2019-07-04 NOTE — Telephone Encounter (Signed)
Called son back and given appt time for 1 pm on 9/8. He verbalized understanding.

## 2019-07-04 NOTE — Telephone Encounter (Signed)
-----   Message from Heath Lark, MD sent at 07/04/2019  8:57 AM EDT ----- Regarding: pls call son Lisa Moyer I saw his mychart message I suggest we proceed with CT scan I can try to get that ordered tomorrow or next week We can keep labs and flush but only see me after CT Seeing me tomorrow would be a waste of time since I would probably order Ct anyway

## 2019-07-04 NOTE — Telephone Encounter (Signed)
Called and given below message. Son verbalized understanding. Dr. Alvy Bimler talked with son and reassured them regarding below message. Appts canceled for today.

## 2019-07-05 ENCOUNTER — Telehealth: Payer: Self-pay

## 2019-07-05 ENCOUNTER — Other Ambulatory Visit: Payer: Self-pay

## 2019-07-05 ENCOUNTER — Ambulatory Visit (HOSPITAL_COMMUNITY)
Admission: RE | Admit: 2019-07-05 | Discharge: 2019-07-05 | Disposition: A | Discharge status: Home / Self Care | Payer: Medicare Other | Source: Ambulatory Visit | Attending: Hematology and Oncology | Admitting: Hematology and Oncology

## 2019-07-05 ENCOUNTER — Encounter (HOSPITAL_COMMUNITY): Payer: Self-pay

## 2019-07-05 ENCOUNTER — Inpatient Hospital Stay: Payer: Medicare Other

## 2019-07-05 ENCOUNTER — Encounter: Payer: Self-pay | Admitting: Hematology and Oncology

## 2019-07-05 ENCOUNTER — Inpatient Hospital Stay (HOSPITAL_BASED_OUTPATIENT_CLINIC_OR_DEPARTMENT_OTHER): Payer: Medicare Other | Admitting: Hematology and Oncology

## 2019-07-05 ENCOUNTER — Inpatient Hospital Stay: Payer: Medicare Other | Attending: Hematology and Oncology

## 2019-07-05 DIAGNOSIS — I129 Hypertensive chronic kidney disease with stage 1 through stage 4 chronic kidney disease, or unspecified chronic kidney disease: Secondary | ICD-10-CM | POA: Insufficient documentation

## 2019-07-05 DIAGNOSIS — R0902 Hypoxemia: Secondary | ICD-10-CM | POA: Diagnosis not present

## 2019-07-05 DIAGNOSIS — Z90710 Acquired absence of both cervix and uterus: Secondary | ICD-10-CM | POA: Diagnosis not present

## 2019-07-05 DIAGNOSIS — N183 Chronic kidney disease, stage 3 unspecified: Secondary | ICD-10-CM

## 2019-07-05 DIAGNOSIS — R410 Disorientation, unspecified: Secondary | ICD-10-CM | POA: Diagnosis not present

## 2019-07-05 DIAGNOSIS — C563 Malignant neoplasm of bilateral ovaries: Secondary | ICD-10-CM

## 2019-07-05 DIAGNOSIS — Z5111 Encounter for antineoplastic chemotherapy: Secondary | ICD-10-CM | POA: Diagnosis present

## 2019-07-05 DIAGNOSIS — C5700 Malignant neoplasm of unspecified fallopian tube: Secondary | ICD-10-CM

## 2019-07-05 DIAGNOSIS — Z79899 Other long term (current) drug therapy: Secondary | ICD-10-CM | POA: Insufficient documentation

## 2019-07-05 DIAGNOSIS — D61818 Other pancytopenia: Secondary | ICD-10-CM | POA: Insufficient documentation

## 2019-07-05 DIAGNOSIS — E039 Hypothyroidism, unspecified: Secondary | ICD-10-CM

## 2019-07-05 DIAGNOSIS — D539 Nutritional anemia, unspecified: Secondary | ICD-10-CM

## 2019-07-05 DIAGNOSIS — C786 Secondary malignant neoplasm of retroperitoneum and peritoneum: Secondary | ICD-10-CM | POA: Diagnosis not present

## 2019-07-05 DIAGNOSIS — R5383 Other fatigue: Secondary | ICD-10-CM | POA: Diagnosis not present

## 2019-07-05 DIAGNOSIS — C562 Malignant neoplasm of left ovary: Secondary | ICD-10-CM | POA: Diagnosis present

## 2019-07-05 DIAGNOSIS — A689 Relapsing fever, unspecified: Secondary | ICD-10-CM | POA: Insufficient documentation

## 2019-07-05 DIAGNOSIS — Z90722 Acquired absence of ovaries, bilateral: Secondary | ICD-10-CM | POA: Insufficient documentation

## 2019-07-05 DIAGNOSIS — C561 Malignant neoplasm of right ovary: Secondary | ICD-10-CM | POA: Insufficient documentation

## 2019-07-05 DIAGNOSIS — G893 Neoplasm related pain (acute) (chronic): Secondary | ICD-10-CM | POA: Insufficient documentation

## 2019-07-05 DIAGNOSIS — D6481 Anemia due to antineoplastic chemotherapy: Secondary | ICD-10-CM

## 2019-07-05 DIAGNOSIS — C5701 Malignant neoplasm of right fallopian tube: Secondary | ICD-10-CM | POA: Diagnosis present

## 2019-07-05 DIAGNOSIS — Z95828 Presence of other vascular implants and grafts: Secondary | ICD-10-CM

## 2019-07-05 DIAGNOSIS — T451X5A Adverse effect of antineoplastic and immunosuppressive drugs, initial encounter: Secondary | ICD-10-CM

## 2019-07-05 DIAGNOSIS — R61 Generalized hyperhidrosis: Secondary | ICD-10-CM | POA: Insufficient documentation

## 2019-07-05 DIAGNOSIS — Z7189 Other specified counseling: Secondary | ICD-10-CM

## 2019-07-05 LAB — CBC WITH DIFFERENTIAL/PLATELET
Abs Immature Granulocytes: 0.02 10*3/uL (ref 0.00–0.07)
Basophils Absolute: 0 10*3/uL (ref 0.0–0.1)
Basophils Relative: 1 %
Eosinophils Absolute: 0.1 10*3/uL (ref 0.0–0.5)
Eosinophils Relative: 1 %
HCT: 29.2 % — ABNORMAL LOW (ref 36.0–46.0)
Hemoglobin: 9.8 g/dL — ABNORMAL LOW (ref 12.0–15.0)
Immature Granulocytes: 0 %
Lymphocytes Relative: 21 %
Lymphs Abs: 1.3 10*3/uL (ref 0.7–4.0)
MCH: 37.3 pg — ABNORMAL HIGH (ref 26.0–34.0)
MCHC: 33.6 g/dL (ref 30.0–36.0)
MCV: 111 fL — ABNORMAL HIGH (ref 80.0–100.0)
Monocytes Absolute: 0.5 10*3/uL (ref 0.1–1.0)
Monocytes Relative: 8 %
Neutro Abs: 4.3 10*3/uL (ref 1.7–7.7)
Neutrophils Relative %: 69 %
Platelets: 150 10*3/uL (ref 150–400)
RBC: 2.63 MIL/uL — ABNORMAL LOW (ref 3.87–5.11)
RDW: 13.3 % (ref 11.5–15.5)
WBC: 6.3 10*3/uL (ref 4.0–10.5)
nRBC: 0 % (ref 0.0–0.2)

## 2019-07-05 LAB — COMPREHENSIVE METABOLIC PANEL
ALT: 10 U/L (ref 0–44)
AST: 17 U/L (ref 15–41)
Albumin: 3.7 g/dL (ref 3.5–5.0)
Alkaline Phosphatase: 72 U/L (ref 38–126)
Anion gap: 12 (ref 5–15)
BUN: 24 mg/dL — ABNORMAL HIGH (ref 8–23)
CO2: 21 mmol/L — ABNORMAL LOW (ref 22–32)
Calcium: 9.4 mg/dL (ref 8.9–10.3)
Chloride: 103 mmol/L (ref 98–111)
Creatinine, Ser: 1.08 mg/dL — ABNORMAL HIGH (ref 0.44–1.00)
GFR calc Af Amer: 55 mL/min — ABNORMAL LOW (ref >60)
GFR calc non Af Amer: 48 mL/min — ABNORMAL LOW (ref >60)
Glucose, Bld: 115 mg/dL — ABNORMAL HIGH (ref 70–99)
Potassium: 4.3 mmol/L (ref 3.5–5.1)
Sodium: 136 mmol/L (ref 135–145)
Total Bilirubin: 0.3 mg/dL (ref 0.3–1.2)
Total Protein: 7.3 g/dL (ref 6.5–8.1)

## 2019-07-05 LAB — TSH: TSH: 3.41 u[IU]/mL (ref 0.308–3.960)

## 2019-07-05 MED ORDER — SODIUM CHLORIDE (PF) 0.9 % IJ SOLN
INTRAMUSCULAR | Status: AC
  Filled 2019-07-05: qty 50

## 2019-07-05 MED ORDER — MORPHINE SULFATE ER 15 MG PO TBCR: 15.0000 mg | EXTENDED_RELEASE_TABLET | Freq: Two times a day (BID) | ORAL | 0 refills | Status: DC

## 2019-07-05 MED ORDER — OXYCODONE-ACETAMINOPHEN 5-325 MG PO TABS: 1.0000 | ORAL_TABLET | ORAL | 0 refills | Status: DC | PRN

## 2019-07-05 MED ORDER — IOHEXOL 300 MG/ML  SOLN
100.0000 mL | Freq: Once | INTRAMUSCULAR | Status: AC | PRN
  Administered 2019-07-05: 100 mL via INTRAVENOUS

## 2019-07-05 MED ORDER — HEPARIN SOD (PORK) LOCK FLUSH 100 UNIT/ML IV SOLN
INTRAVENOUS | Status: AC
  Filled 2019-07-05: qty 5

## 2019-07-05 MED ORDER — SODIUM CHLORIDE 0.9% FLUSH
10.0000 mL | Freq: Once | INTRAVENOUS | Status: AC
  Administered 2019-07-05: 10:00:00 10 mL
  Filled 2019-07-05: qty 10

## 2019-07-05 MED ORDER — HEPARIN SOD (PORK) LOCK FLUSH 100 UNIT/ML IV SOLN
500.0000 [IU] | Freq: Once | INTRAVENOUS | Status: AC
  Administered 2019-07-05: 500 [IU] via INTRAVENOUS

## 2019-07-05 MED FILL — OXYCODONE-ACETAMINOPHEN 5-3: 5-325 | 23 days supply | Qty: 90 | Fill #0

## 2019-07-05 MED FILL — MORPHINE SULF ER 15 MG TAB: 15 | 30 days supply | Qty: 60 | Fill #0

## 2019-07-05 NOTE — Assessment & Plan Note (Signed)
I had numerous goals of care discussion with the patient and family Goal is strictly palliative in nature With combination chemotherapy, I would anticipate 25 to 40% chance of response With single agent treatment, I estimated 20 to 25% chance of response rates At present time, she appears to be interested to pursue palliative chemotherapy I will talk to the son again next week for further follow-up on plan of care

## 2019-07-05 NOTE — Telephone Encounter (Signed)
Called son and given 2 pm appt with Dr. Alvy Bimler for today. He verbalized understanding. Next weeks appt canceled.

## 2019-07-05 NOTE — Assessment & Plan Note (Signed)
Her renal function is stable Observe 

## 2019-07-05 NOTE — Progress Notes (Signed)
Lisa Moyer OFFICE PROGRESS NOTE  Patient Care Team: Patient, No Pcp Per as PCP - General ([place]) Ileana Roup, MD as Consulting Physician (General Surgery)  ASSESSMENT & PLAN:  Fallopian tube cancer, carcinoma ([place]) I have reviewed all blood work, imaging studies with the patient and his family over the telephone CT imaging show significant disease progression in the span of less than 3 months The patient is symptomatic We have extensive discussions about treatment options Even though it has been 6 months or more since her last dose of carboplatin, with her significant signs of disease progression, I suspect Lisa Moyer is platinum refractory I have reviewed the current guidelines with the patient and family If we deem her platinum sensitive, Lisa Moyer can either get combination chemotherapy with carboplatin/gemcitabine, carboplatin/paclitaxel or carboplatin/docetaxel If we consider her platinum refractory, I would recommend consideration for single agent gemcitabine versus docetaxel Given her age and risk of cardiomyopathy, I do not recommend treatment with doxorubicin further The patient have significant side effects from bevacizumab and still have poorly controlled hypertension and I would not recommend combination chemo involving bevacizumab Lisa Moyer is recommended to start olaparib Some of the risks, benefits, side effects of various treatment options including risk of allergic reaction with carboplatin, bone marrow suppression requiring transfusion support, risks of infection, neuropathy, hair loss and others were fully discussed with the patient and family Lisa Moyer is undecided I will call her son next week for further follow-up  Anemia due to antineoplastic chemotherapy Lisa Moyer has multifactorial anemia, likely due to anemia chronic kidney disease and side effects of olaparib Lisa Moyer is recommended to stop chemotherapy We will observe her blood count for now  CKD (chronic  kidney disease), stage III ([place]) Her renal function is stable Observe  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 I recommend starting her on long-acting morphine sulfate I recommend her to try morphine sulfate at nighttime only for a few days and Lisa Moyer if Lisa Moyer has no problems with excessive sedation, constipation or nausea, Lisa Moyer will start taking long-acting morphine sulfate twice a day I also refill her prescription Percocet to take as needed We discussed narcotic refill policy  Goals of care, counseling/discussion I had numerous goals of care discussion with the patient and family Goal is strictly palliative in nature With combination chemotherapy, I would anticipate 25 to 40% chance of response With single agent treatment, I estimated 20 to 25% chance of response rates At present time, Lisa Moyer appears to be interested to pursue palliative chemotherapy I will talk to the son again next week for further follow-up on plan of care   No orders of the defined types were placed in this encounter.   INTERVAL HISTORY: Please see below for problem oriented charting. Lisa Moyer is seen today to review test results and follow-up Over the past 2 weeks, Lisa Moyer started to have progressive left-sided abdominal pain Lisa Moyer is now taking Percocet every 4-6 hours for pain control Lisa Moyer denies nausea Lisa Moyer has mild constipation, relieved by laxatives Both her son and daughter are available over the telephone to discuss test results and plan of care  SUMMARY OF ONCOLOGIC HISTORY: Oncology History Overview Note  Neg genetics.  HRD test was done on peritoneal fluid from 07/18/2018: HRD +   Fallopian tube cancer, carcinoma ([place])  06/18/2016 Tumor Marker   Patient's tumor was tested for the following markers: CA-125 Results of the tumor marker test revealed 298.   06/23/2016 PET scan   Diffuse hypermetabolic peritoneal carcinomatosis  in the abdomen and pelvis, internal mammary adenopathy in  the chest, hypermetabolic right paracardiac/diaphragmatic LN, bilateral pleural effusion and moderate ascites   06/24/2016 Procedure   Therapeutic paracentesis   06/24/2016 Pathology Results   Positive for adenocarcinoma, Mullerian primary   06/28/2016 Procedure   Lisa Moyer underwent CT guided biopsy of omentum   06/28/2016 Pathology Results   High grade serous involving the omentum, positive for p53, PAX8, WT1 and p16.   07/01/2016 Procedure   Findings/impression:   1. Sonographic evaluation of the right internal jugular vein confirms patency. Sonography was required to gain central venous access. An image documenting patency and needle access was saved.   2. Successful placement of right internal jugular vein subcutaneous Mediport as described. Spot film shows the catheter tip at the cavoatrial junction. No pneumothorax. The Mediport aspirates and flushes normally.   07/02/2016 - 08/12/2016 Chemotherapy   The patient had 3 cycles of carboplatin and taxol chemotherapy treatment.     08/29/2016 Imaging   Ct scan of chest, abdomen and pelvis showed marked improvement and resolution of thoracic adenopathy   09/08/2016 Surgery   Lisa Moyer underwent interval debulking surgery with TAH/BSO and infracolic omentectomy by Dr. Wynelle Cleveland   09/08/2016 Pathology Results   Right tube: scant serous carcinoma. No viable tumor. Left tube and ovary: normal. Omentum: low volume serous carcinoma with few psamomma bodies.   10/07/2016 - 11/18/2016 Chemotherapy   The patient had 3 more cycles of carboplatin and taxol for chemotherapy treatment.     02/07/2017 Imaging   Impression:  1. Colonic diverticulosis without acute diverticulitis. 2. Stable calcification right kidney without hydronephrosis. 3. No acute process or significant interval change has occurred since August 29, 2016.   05/09/2018 Imaging   Impression:  Findings are most suspicious for acute sigmoid colonic diverticulitis with intraloop abscess  formation.   05/14/2018 Imaging   Impression: The pelvic fluid collection contiguous with sigmoid colonic diverticulitis changes has not significant change since May 09, 2018. No new fluid collections are seen   06/28/2018 Imaging   IMPRESSION: Changes consistent with diverticulitis with free fluid within the pelvis. No focal contained abscess is seen. A previously noted fluid collection deep within the pelvis has resolved in the interval.   Stable nonobstructing right renal stone.  The remainder of the exam is stable from the prior study.    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 There is omental caking as well as stranding within various areas of the peritoneal fat. There is also wall thickening of the sigmoid colon with adjacent stranding. This finding can also be related to peritoneal carcinomatosis and implants although an inflammatory process of the sigmoid colon is not excluded.   Right nephrolithiasis.  Ileus pattern.   07/16/2018 Imaging   Impression:   Findings of peritoneal carcinomatosis, rapidly progressive. Not present in July 2019, progressed when compared to September 13.  Multiple bowel loops are distorted but no distention or transition zone to suggest a component of active obstruction.   07/18/2018 Procedure   Findings/impression:  1. Sonographic evaluation of ascites. Sonography was required to gain access to ascites. An image documenting ascites was saved. 2. Successful ultrasound guided paracentesis using a temporary peritoneal drainage catheter inserted in the right upper quadrant as described yielding 600cc of ascites.    07/20/2018 Echocardiogram   General:  The patient was in normal  sinus rhythm.  Left Ventricle:  Left ventricular ejection fraction was normal, estimated in the range of 60  to 65%. The left ventricular cavity size appears normal. The left ventricular wall thickness appears normal. The left ventricle is thickened in a fashion  consistent with mild concentric hypertrophy. Doppler tissue velocities and mitral inflow profile are consistent with Stage I diastolic dysfunction. The calculated ejection fraction is 68.1 %.  Right Ventricle:  The right ventricle appears normal in size and function.  Left Atrium:  The left atrium appears normal.  Right Atrium:  The right atrium appears normal.  Aortic Valve:  The structure of the aortic valve is tricuspid. There is evidence of trivial (trace) aortic regurgitation.  Mitral Valve:  There is no evidence of mitral regurgitation. The mitral valve appears normal in structure.  Pulmonic Valve:  There is evidence of trace (trivial) pulmonic regurgitation. The pulmonic valve appears normal in structure.  Tricuspid Valve:  There is evidence of trace (trivial) tricuspid regurgitation. The tricuspid valve appears normal in structure.  Pericardium:  There is no evidence of pericardial effusion.  Aorta:  The visualized portions of the aorta (ascending aorta, aortic root, and aortic arch) appear normal.  Pulmonic Artery:  Unable to obtain RVSP due to insufficient tricuspid regurgitation jet.  Conclusions: Left ventricular ejection fraction was normal, estimated in the range of 60 to 65%. The left ventricular cavity size appears normal. The left ventricular wall thickness appears normal. The left ventricle is thickened in a fashion consistent with mild concentric hypertrophy. Doppler tissue velocities and mitral inflow profile are consistent with Stage I diastolic dysfunction. The calculated ejection fraction is 68.1%.There is evidence of trivial (trace) aortic regurgitation.There is evidence of trace (trivial) pulmonic regurgitation.There is evidence of trace (trivial) tricuspid regurgitation.There is no evidence of pericardial  effusion.Unable to obtain RVSP due to insufficient tricuspid regurgitation jet.   07/23/2018 - 12/25/2018 Chemotherapy   The patient had carboplatin, doxil and Avastin for chemotherapy treatment x 6 cycles   08/01/2018 Imaging   Ct head showed no injuries.   08/01/2018 Procedure   Findings/impression:  1. Sonographic evaluation of ascites. Sonography was required to gain access to ascites. An image documenting ascites was saved. 2. Successful ultrasound guided paracentesis using a temporary peritoneal drainage catheter inserted in the right upper quadrant as described yielding 400cc of ascites.    08/06/2018 Tumor Marker   Patient's tumor was tested for the following markers: CA-125 Results of the tumor marker test revealed 373   08/20/2018 Tumor Marker   Patient's tumor was tested for the following markers: CA-125 Results of the tumor marker test revealed 51.   10/03/2018 Genetic Testing   Patient has genetic testing done for genetics testing using her saliva. 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  Liver and spleen homogeneously enhance. No lesion seen.  The adjacent gallbladder is surgically absent.  The adrenal glands, kidneys, pancreas are normal.  Pelvic contents are remarkable for an absent uterus.  The mesenteric stranding has nearly completely resolved. Minimal reticular prominence throughout the pelvis.  No residual ascites.  No bowel wall thickening or. No dilated loops or transition zone.  The appendix is not seen. But no inflammatory changes in the right lower quadrant.  Osseous structures are intact.   11/27/2018 Imaging   Ct scan abdomen and pelvis showed colonic diverticulosis and nonspecific fluid levels in the bowel  12/24/2018 Cancer Staging   Staging form: Ovary, Fallopian Tube, and Primary Peritoneal Carcinoma, AJCC 8th Edition - Pathologic: Stage  IVB (rpT3a, pN0, pM1b) - Signed by Heath Lark, MD on 12/24/2018   12/31/2018 Tumor Marker   Patient's tumor was tested for the following markers: CA-125 Results of the tumor marker test revealed 14.4   01/01/2019 Imaging   1. LEFT jugular Port-A-Cath catheter tip projects over the mid SVC. 2. No acute cardiopulmonary disease.    01/04/2019 Echocardiogram   1. The left ventricle has normal systolic function with an ejection fraction of 60-65%. The cavity size was normal. Left ventricular diastolic Doppler parameters are consistent with impaired relaxation.  2. The right ventricle has normal systolic function. The cavity was normal. There is no increase in right ventricular wall thickness.  3. The mitral valve is normal in structure.  4. The tricuspid valve is normal in structure.  5. The aortic valve is normal in structure.  6. The pulmonic valve was normal in structure.  7. 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    02/13/2019 -  Chemotherapy   The patient is started on Lynparza due to [place]    04/22/2019 Tumor Marker   Patient's tumor was tested for the following markers: CA-125 Results of the tumor marker test revealed 44.8   04/26/2019 Imaging   1. No evidence of recurrent or metastatic carcinoma within the abdomen or pelvis. 2. Colonic diverticulosis, without radiographic evidence of diverticulitis or other acute findings.   Aortic Atherosclerosis (ICD10-I70.0).   05/28/2019 Tumor Marker   Patient's tumor was tested for the following markers: CA-125 Results of the tumor marker test revealed 104   07/05/2019 Imaging   1. Imaging findings consistent with recurrent peritoneal carcinomatosis. This is most apparent within the left abdomen involving the transverse colon and descending colon where there is increased soft tissue, loculated fluid and colonic serosal soft tissue thickening. 2. Increase in size of retroperitoneal and pelvic lymph nodes compatible with  metastatic adenopathy. 3. No evidence for bowel obstruction.       REVIEW OF SYSTEMS:   Constitutional: Denies fevers, chills or abnormal weight loss Eyes: Denies blurriness of vision Ears, nose, mouth, throat, and face: Denies mucositis or sore throat Respiratory: Denies cough, dyspnea or wheezes Cardiovascular: Denies palpitation, chest discomfort or lower extremity swelling Skin: Denies abnormal skin rashes Lymphatics: Denies new lymphadenopathy or easy bruising Neurological:Denies numbness, tingling or new weaknesses Behavioral/Psych: Mood is stable, no new changes  All other systems were reviewed with the patient and are negative.  I have reviewed the past medical history, past surgical history, social history and family history with the patient and they are unchanged from previous note.  ALLERGIES:  is allergic to latex.  MEDICATIONS:  Current Outpatient Medications  Medication Sig Dispense Refill  . Acetaminophen 325 MG CAPS Take 650 mg by mouth every 6 (six) hours as needed.    . ALPRAZolam (XANAX) 0.25 MG tablet Take 0.25 mg by mouth at bedtime as needed.    Marland Kitchen amLODipine (NORVASC) 10 MG tablet Take 1 tablet (10 mg total) by mouth daily. 90 tablet 9  . Ascorbic Acid (VITAMIN C PO) Take 500 mg by mouth daily.     Marland Kitchen atenolol (TENORMIN) 25 MG tablet Take 0.5 tablets (12.5 mg total) by mouth daily. 30 tablet 11  . calcium carbonate (TUMS) 500 MG chewable tablet Chew 1 tablet by mouth as needed for indigestion or heartburn.    . Cholecalciferol (VITAMIN  D PO) Take 1 tablet by mouth daily. D3 1000units    . escitalopram (LEXAPRO) 10 MG tablet Take 1 tablet (10 mg total) by mouth daily. 90 tablet 1  . fluticasone (FLONASE) 50 MCG/ACT nasal spray Place 1 spray into both nostrils daily.    . hydrochlorothiazide (MICROZIDE) 12.5 MG capsule Take 12.5 mg by mouth daily.    Marland Kitchen levothyroxine (SYNTHROID) 50 MCG tablet Take 0.5 tablets (25 mcg total) by mouth daily before breakfast. 30  tablet 3  . losartan (COZAAR) 100 MG tablet Take 1 tablet (100 mg total) by mouth daily. 30 tablet 0  . morphine (MS CONTIN) 15 MG 12 hr tablet Take 1 tablet (15 mg total) by mouth every 12 (twelve) hours. 60 tablet 0  . Multiple Vitamin (MULTIVITAMIN) tablet Take 1 tablet by mouth daily.    . Multiple Vitamins-Minerals (VITAMIN D3 COMPLETE PO) Take 1,000 Units by mouth daily. 1000 IU Vitamin D3    . olaparib (LYNPARZA) 100 MG tablet Take 2 tablets (200 mg total) by mouth daily. 60 tablet 11  . ondansetron (ZOFRAN) 8 MG tablet Take 8 mg by mouth every 8 (eight) hours as needed for nausea or vomiting.    Marland Kitchen oxyCODONE-acetaminophen (PERCOCET/ROXICET) 5-325 MG tablet Take 1 tablet by mouth every 4 (four) hours as needed for severe pain. 90 tablet 0  . polyethylene glycol (MIRALAX / GLYCOLAX) 17 g packet Take 17 g by mouth 2 (two) times daily.     No current facility-administered medications for this visit.    Facility-Administered Medications Ordered in Other Visits  Medication Dose Route Frequency Provider Last Rate Last Dose  . heparin lock flush 100 UNIT/ML injection           . sodium chloride (PF) 0.9 % injection             PHYSICAL EXAMINATION: ECOG PERFORMANCE STATUS: 1 - Symptomatic but completely ambulatory  Vitals:   07/05/19 1415  BP: 113/80  Pulse: 66  Resp: 18  Temp: 98.9 F (37.2 C)  SpO2: 97%   Filed Weights   07/05/19 1415  Weight: 150 lb 3.2 oz (68.1 kg)    GENERAL:alert, no distress and comfortable Musculoskeletal:no cyanosis of digits and no clubbing  NEURO: alert & oriented x 3 with fluent speech, no focal motor/sensory deficits  LABORATORY DATA:  I have reviewed the data as listed    Component Value Date/Time   NA 136 07/05/2019 0930   NA 139 06/27/2018 1649   K 4.3 07/05/2019 0930   CL 103 07/05/2019 0930   CO2 21 (L) 07/05/2019 0930   GLUCOSE 115 (H) 07/05/2019 0930   BUN 24 (H) 07/05/2019 0930   BUN 17 06/27/2018 1649   CREATININE 1.08 (H)  07/05/2019 0930   CALCIUM 9.4 07/05/2019 0930   PROT 7.3 07/05/2019 0930   PROT 6.8 05/24/2018 0851   ALBUMIN 3.7 07/05/2019 0930   ALBUMIN 4.2 05/24/2018 0851   AST 17 07/05/2019 0930   ALT 10 07/05/2019 0930   ALKPHOS 72 07/05/2019 0930   BILITOT 0.3 07/05/2019 0930   BILITOT 0.2 05/24/2018 0851   GFRNONAA 48 (L) 07/05/2019 0930   GFRAA 55 (L) 07/05/2019 0930    No results found for: SPEP, UPEP  Lab Results  Component Value Date   WBC 6.3 07/05/2019   NEUTROABS 4.3 07/05/2019   HGB 9.8 (L) 07/05/2019   HCT 29.2 (L) 07/05/2019   MCV 111.0 (H) 07/05/2019   PLT 150 07/05/2019  Chemistry      Component Value Date/Time   NA 136 07/05/2019 0930   NA 139 06/27/2018 1649   K 4.3 07/05/2019 0930   CL 103 07/05/2019 0930   CO2 21 (L) 07/05/2019 0930   BUN 24 (H) 07/05/2019 0930   BUN 17 06/27/2018 1649   CREATININE 1.08 (H) 07/05/2019 0930      Component Value Date/Time   CALCIUM 9.4 07/05/2019 0930   ALKPHOS 72 07/05/2019 0930   AST 17 07/05/2019 0930   ALT 10 07/05/2019 0930   BILITOT 0.3 07/05/2019 0930   BILITOT 0.2 05/24/2018 0851       RADIOGRAPHIC STUDIES: I have reviewed multiple imaging studies with the patient and family I have personally reviewed the radiological images as listed and agreed with the findings in the report. Ct Abdomen Pelvis W Contrast  Result Date: 07/05/2019 CLINICAL DATA:  Epithelial ovarian/fallopian tube/primary peritoneal carcinoma. EXAM: CT ABDOMEN AND PELVIS WITH CONTRAST TECHNIQUE: Multidetector CT imaging of the abdomen and pelvis was performed using the standard protocol following bolus administration of intravenous contrast. CONTRAST:  112m OMNIPAQUE IOHEXOL 300 MG/ML  SOLN COMPARISON:  04/26/2019 FINDINGS: Lower chest: 4 mm left lower lobe lung nodule noted, unchanged. Hepatobiliary: There is no focal liver abnormality identified. Previous cholecystectomy. Common bile duct is upper limits of normal in caliber measuring 6 mm.  Pancreas: Unremarkable. No pancreatic ductal dilatation or surrounding inflammatory changes. Spleen: Normal in size without focal abnormality. Adrenals/Urinary Tract: Normal appearance of the adrenal glands. Small, 3 mm right lower pole kidney stone. No kidney mass or hydronephrosis identified at this time. The urinary bladder appears normal. Stomach/Bowel: Stomach is normal. No bowel wall inflammation or distension. Distal colonic diverticulosis identified without acute inflammation. New serosal thickening along the posterior wall of the transverse colon, image 46/2. Vascular/Lymphatic: Aortic atherosclerosis. No aneurysm. Increase in size of retroperitoneal lymph nodes. Left periaortic node the up measures 0.8 cm, image 32/2 new from previous exam. Left retroperitoneal lymph node measures 0.7 cm, image 35/2 previously 0.3 cm. 1 cm left common iliac node noted, image 52/2. Previously 0.6 cm. 1.1 cm left external iliac node identified. Previously 0.9 cm. Reproductive: Status post hysterectomy. No adnexal masses. Other: Peritoneal carcinomatosis is identified. Most notable is in the left lower quadrant of the abdomen surrounding the distal transverse colon. Here, there is an area of soft tissue infiltration measuring 5.1 x 2.2 cm with adjacent serosal involvement of the colon, image 48/2. Anterior to the is a cystic lesion along the undersurface of the ventral abdominal wall which measures 3.3 x 2.1 cm, image 48/2. Peritoneal thickening and nodularity along the left pericolic gutter is new, image 42/5. Adjacent increased serosal soft tissue is noted along the lateral aspect of the descending colon, image 50/5. Within the left upper quadrant of the abdomen there is an area of peritoneal soft tissue adjacent to the distal transverse colon which measures 3.8 x 2.5 cm, image 38/5. Lymph node or peritoneal nodule is identified in the left lower quadrant of the abdomen measuring 1.0 x 1.6 cm, image 30/5. Previously 0.7 x  1.4 cm. Musculoskeletal: Scoliosis and degenerative disc disease identified within the lumbar spine. IMPRESSION: 1. Imaging findings consistent with recurrent peritoneal carcinomatosis. This is most apparent within the left abdomen involving the transverse colon and descending colon where there is increased soft tissue, loculated fluid and colonic serosal soft tissue thickening. 2. Increase in size of retroperitoneal and pelvic lymph nodes compatible with metastatic adenopathy. 3. No evidence for bowel  obstruction. Electronically Signed   By: Kerby Moors M.D.   On: 07/05/2019 11:55    All questions were answered. The patient knows to call the clinic with any problems, questions or concerns. No barriers to learning was detected.  I spent 80 minutes counseling the patient face to face. The total time spent in the appointment was 90 minutes and more than 50% was on counseling and review of test results  Heath Lark, MD 07/05/2019 4:09 PM

## 2019-07-05 NOTE — Assessment & Plan Note (Signed)
She has poorly controlled cancer associated pain I recommend starting her on long-acting morphine sulfate I recommend her to try morphine sulfate at nighttime only for a few days and she if she has no problems with excessive sedation, constipation or nausea, she will start taking long-acting morphine sulfate twice a day I also refill her prescription Percocet to take as needed We discussed narcotic refill policy

## 2019-07-05 NOTE — Assessment & Plan Note (Signed)
She has multifactorial anemia, likely due to anemia chronic kidney disease and side effects of olaparib She is recommended to stop chemotherapy We will observe her blood count for now

## 2019-07-05 NOTE — Assessment & Plan Note (Signed)
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 I have reviewed the current guidelines with the patient and family If we deem her platinum sensitive, she can either get combination chemotherapy with carboplatin/gemcitabine, carboplatin/paclitaxel or carboplatin/docetaxel If we consider her platinum refractory, I would recommend consideration for single agent gemcitabine versus docetaxel Given her age and risk of cardiomyopathy, I do not recommend treatment with doxorubicin further The patient have significant side effects from bevacizumab and still have poorly controlled hypertension and I would not recommend combination chemo involving bevacizumab She is recommended to start olaparib Some of the risks, benefits, side effects of various treatment options including risk of allergic reaction with carboplatin, bone marrow suppression requiring transfusion support, risks of infection, neuropathy, hair loss and others were fully discussed with the patient and family She is undecided I will call her son next week for further follow-up

## 2019-07-06 LAB — CA 125: Cancer Antigen (CA) 125: 233 U/mL — ABNORMAL HIGH (ref 0.0–38.1)

## 2019-07-09 ENCOUNTER — Ambulatory Visit: Payer: Medicare Other | Admitting: Hematology and Oncology

## 2019-07-09 ENCOUNTER — Other Ambulatory Visit: Payer: Medicare Other

## 2019-07-09 ENCOUNTER — Encounter: Payer: Self-pay | Admitting: Hematology and Oncology

## 2019-07-09 ENCOUNTER — Telehealth: Payer: Self-pay | Admitting: Hematology and Oncology

## 2019-07-09 ENCOUNTER — Other Ambulatory Visit: Payer: Self-pay | Admitting: Hematology and Oncology

## 2019-07-09 DIAGNOSIS — C5700 Malignant neoplasm of unspecified fallopian tube: Secondary | ICD-10-CM

## 2019-07-09 NOTE — Telephone Encounter (Signed)
Scheduled appt per 9/8 sch message- unable to reach pt  Son . Left message with appt date and time

## 2019-07-09 NOTE — Telephone Encounter (Signed)
I spoke with the son to discuss plan of care Ultimately, we agreed to proceed with palliative chemotherapy with gemcitabine I will proceed with treatment at 800 mg per metered square on day 1 and 8 with the cycle of every 21 days I will see her prior to day 8 of treatment for toxicity review She has adequate pain management for now

## 2019-07-09 NOTE — Progress Notes (Signed)
DISCONTINUE OFF PATHWAY REGIMEN - Ovarian   [id number]:Bevacizumab 15 mg/kg q21d:   A cycle is every 21 days:     Bevacizumab-xxxx   **Always confirm dose/schedule in your pharmacy ordering system**  REASON: Disease Progression PRIOR TREATMENT: Off Pathway: Bevacizumab 15 mg/kg q21d TREATMENT RESPONSE: Progressive Disease (PD)  START OFF PATHWAY REGIMEN - Ovarian   [id number]:Gemcitabine 1,000 mg/m2 D1, 8  q21 Days:   A cycle is every 21 days:     Gemcitabine   **Always confirm dose/schedule in your pharmacy ordering system**  Patient Characteristics: Recurrent or Progressive Disease, Third Line Therapy, Platinum Resistant or < 6 Months Since Last Platinum Therapy Therapeutic Status: Recurrent or Progressive Disease BRCA Mutation Status: Absent Line of Therapy: Third Line  Intent of Therapy: Non-Curative / Palliative Intent, Discussed with Patient

## 2019-07-10 ENCOUNTER — Encounter: Payer: Self-pay | Admitting: Hematology and Oncology

## 2019-07-15 ENCOUNTER — Inpatient Hospital Stay: Payer: Medicare Other

## 2019-07-15 ENCOUNTER — Other Ambulatory Visit: Payer: Self-pay | Admitting: *Deleted

## 2019-07-15 ENCOUNTER — Other Ambulatory Visit: Payer: Self-pay

## 2019-07-15 VITALS — BP 125/62 | HR 47 | Temp 98.3°F | Resp 18

## 2019-07-15 DIAGNOSIS — E039 Hypothyroidism, unspecified: Secondary | ICD-10-CM

## 2019-07-15 DIAGNOSIS — Z5111 Encounter for antineoplastic chemotherapy: Secondary | ICD-10-CM | POA: Diagnosis not present

## 2019-07-15 DIAGNOSIS — C5700 Malignant neoplasm of unspecified fallopian tube: Secondary | ICD-10-CM

## 2019-07-15 DIAGNOSIS — D539 Nutritional anemia, unspecified: Secondary | ICD-10-CM

## 2019-07-15 DIAGNOSIS — Z7189 Other specified counseling: Secondary | ICD-10-CM

## 2019-07-15 DIAGNOSIS — D6481 Anemia due to antineoplastic chemotherapy: Secondary | ICD-10-CM

## 2019-07-15 DIAGNOSIS — Z95828 Presence of other vascular implants and grafts: Secondary | ICD-10-CM

## 2019-07-15 LAB — CBC WITH DIFFERENTIAL/PLATELET
Abs Immature Granulocytes: 0.03 10*3/uL (ref 0.00–0.07)
Basophils Absolute: 0 10*3/uL (ref 0.0–0.1)
Basophils Relative: 1 %
Eosinophils Absolute: 0.1 10*3/uL (ref 0.0–0.5)
Eosinophils Relative: 2 %
HCT: 27.7 % — ABNORMAL LOW (ref 36.0–46.0)
Hemoglobin: 9 g/dL — ABNORMAL LOW (ref 12.0–15.0)
Immature Granulocytes: 1 %
Lymphocytes Relative: 25 %
Lymphs Abs: 1.3 10*3/uL (ref 0.7–4.0)
MCH: 36.7 pg — ABNORMAL HIGH (ref 26.0–34.0)
MCHC: 32.5 g/dL (ref 30.0–36.0)
MCV: 113.1 fL — ABNORMAL HIGH (ref 80.0–100.0)
Monocytes Absolute: 0.6 10*3/uL (ref 0.1–1.0)
Monocytes Relative: 11 %
Neutro Abs: 3.3 10*3/uL (ref 1.7–7.7)
Neutrophils Relative %: 60 %
Platelets: 160 10*3/uL (ref 150–400)
RBC: 2.45 MIL/uL — ABNORMAL LOW (ref 3.87–5.11)
RDW: 13.3 % (ref 11.5–15.5)
WBC: 5.4 10*3/uL (ref 4.0–10.5)
nRBC: 0 % (ref 0.0–0.2)

## 2019-07-15 LAB — COMPREHENSIVE METABOLIC PANEL
ALT: 8 U/L (ref 0–44)
AST: 19 U/L (ref 15–41)
Albumin: 3.7 g/dL (ref 3.5–5.0)
Alkaline Phosphatase: 68 U/L (ref 38–126)
Anion gap: 9 (ref 5–15)
BUN: 35 mg/dL — ABNORMAL HIGH (ref 8–23)
CO2: 26 mmol/L (ref 22–32)
Calcium: 9.1 mg/dL (ref 8.9–10.3)
Chloride: 104 mmol/L (ref 98–111)
Creatinine, Ser: 1.49 mg/dL — ABNORMAL HIGH (ref 0.44–1.00)
GFR calc Af Amer: 38 mL/min — ABNORMAL LOW (ref >60)
GFR calc non Af Amer: 32 mL/min — ABNORMAL LOW (ref >60)
Glucose, Bld: 101 mg/dL — ABNORMAL HIGH (ref 70–99)
Potassium: 4.6 mmol/L (ref 3.5–5.1)
Sodium: 139 mmol/L (ref 135–145)
Total Bilirubin: <0.2 mg/dL — ABNORMAL LOW (ref 0.3–1.2)
Total Protein: 7 g/dL (ref 6.5–8.1)

## 2019-07-15 LAB — TSH: TSH: 8.437 u[IU]/mL — ABNORMAL HIGH (ref 0.308–3.960)

## 2019-07-15 MED ORDER — SODIUM CHLORIDE 0.9% FLUSH
10.0000 mL | INTRAVENOUS | Status: DC | PRN
  Administered 2019-07-15: 10 mL
  Filled 2019-07-15: qty 10

## 2019-07-15 MED ORDER — SODIUM CHLORIDE 0.9 % IV SOLN
800.0000 mg/m2 | Freq: Once | INTRAVENOUS | Status: AC
  Administered 2019-07-15: 1368 mg via INTRAVENOUS
  Filled 2019-07-15: qty 35.98

## 2019-07-15 MED ORDER — HEPARIN SOD (PORK) LOCK FLUSH 100 UNIT/ML IV SOLN
500.0000 [IU] | Freq: Once | INTRAVENOUS | Status: AC | PRN
  Administered 2019-07-15: 500 [IU]
  Filled 2019-07-15: qty 5

## 2019-07-15 MED ORDER — PROCHLORPERAZINE MALEATE 10 MG PO TABS
10.0000 mg | ORAL_TABLET | Freq: Once | ORAL | Status: AC
  Administered 2019-07-15: 10 mg via ORAL

## 2019-07-15 MED ORDER — SODIUM CHLORIDE 0.9% FLUSH
10.0000 mL | Freq: Once | INTRAVENOUS | Status: AC
  Administered 2019-07-15: 14:00:00 10 mL
  Filled 2019-07-15: qty 10

## 2019-07-15 MED ORDER — ONDANSETRON HCL 8 MG PO TABS: 8.0000 mg | ORAL_TABLET | Freq: Three times a day (TID) | ORAL | 0 refills | Status: DC | PRN

## 2019-07-15 MED ORDER — PROCHLORPERAZINE MALEATE 10 MG PO TABS
ORAL_TABLET | ORAL | Status: AC
  Filled 2019-07-15: qty 1

## 2019-07-15 MED ORDER — SODIUM CHLORIDE 0.9 % IV SOLN
Freq: Once | INTRAVENOUS | Status: AC
  Administered 2019-07-15: 15:00:00 via INTRAVENOUS
  Filled 2019-07-15: qty 250

## 2019-07-15 NOTE — Patient Instructions (Signed)
Cancer Center Discharge Instructions for Patients Receiving Chemotherapy  Today you received the following chemotherapy agents: Gemzar  To help prevent nausea and vomiting after your treatment, we encourage you to take your nausea medication as directed If you develop nausea and vomiting that is not controlled by your nausea medication, call the clinic.   BELOW ARE SYMPTOMS THAT SHOULD BE REPORTED IMMEDIATELY:  *FEVER GREATER THAN 100.5 F  *CHILLS WITH OR WITHOUT FEVER  NAUSEA AND VOMITING THAT IS NOT CONTROLLED WITH YOUR NAUSEA MEDICATION  *UNUSUAL SHORTNESS OF BREATH  *UNUSUAL BRUISING OR BLEEDING  TENDERNESS IN MOUTH AND THROAT WITH OR WITHOUT PRESENCE OF ULCERS  *URINARY PROBLEMS  *BOWEL PROBLEMS  UNUSUAL RASH Items with * indicate a potential emergency and should be followed up as soon as possible.  Feel free to call the clinic should you have any questions or concerns. The clinic phone number is [phone].  Please show the CHEMO ALERT CARD at check-in to the Emergency Department and triage nurse.  Gemcitabine injection What is this medicine? GEMCITABINE (jem SYE ta been) is a chemotherapy drug. This medicine is used to treat many types of cancer like breast cancer, lung cancer, pancreatic cancer, and ovarian cancer. This medicine may be used for other purposes; ask your health care provider or pharmacist if you have questions. COMMON BRAND NAME(S): Gemzar, Infugem What should I tell my health care provider before I take this medicine? They need to know if you have any of these conditions:  blood disorders  infection  kidney disease  liver disease  lung or breathing disease, like asthma  recent or ongoing radiation therapy  an unusual or allergic reaction to gemcitabine, other chemotherapy, other medicines, foods, dyes, or preservatives  pregnant or trying to get pregnant  breast-feeding How should I use this medicine? This drug is  given as an infusion into a vein. It is administered in a hospital or clinic by a specially trained health care professional. Talk to your pediatrician regarding the use of this medicine in children. Special care may be needed. Overdosage: If you think you have taken too much of this medicine contact a poison control center or emergency room at once. NOTE: This medicine is only for you. Do not share this medicine with others. What if I miss a dose? It is important not to miss your dose. Call your doctor or health care professional if you are unable to keep an appointment. What may interact with this medicine?  medicines to increase blood counts like filgrastim, pegfilgrastim, sargramostim  some other chemotherapy drugs like cisplatin  vaccines Talk to your doctor or health care professional before taking any of these medicines:  acetaminophen  aspirin  ibuprofen  ketoprofen  naproxen This list may not describe all possible interactions. Give your health care provider a list of all the medicines, herbs, non-prescription drugs, or dietary supplements you use. Also tell them if you smoke, drink alcohol, or use illegal drugs. Some items may interact with your medicine. What should I watch for while using this medicine? Visit your doctor for checks on your progress. This drug may make you feel generally unwell. This is not uncommon, as chemotherapy can affect healthy cells as well as cancer cells. Report any side effects. Continue your course of treatment even though you feel ill unless your doctor tells you to stop. In some cases, you may be given additional medicines to help with side effects. Follow all directions for their use. Call your doctor   or health care professional for advice if you get a fever, chills or sore throat, or other symptoms of a cold or flu. Do not treat yourself. This drug decreases your body's ability to fight infections. Try to avoid being around people who are  sick. This medicine may increase your risk to bruise or bleed. Call your doctor or health care professional if you notice any unusual bleeding. Be careful brushing and flossing your teeth or using a toothpick because you may get an infection or bleed more easily. If you have any dental work done, tell your dentist you are receiving this medicine. Avoid taking products that contain aspirin, acetaminophen, ibuprofen, naproxen, or ketoprofen unless instructed by your doctor. These medicines may hide a fever. Do not become pregnant while taking this medicine or for 6 months after stopping it. Women should inform their doctor if they wish to become pregnant or think they might be pregnant. Men should not father a child while taking this medicine and for 3 months after stopping it. There is a potential for serious side effects to an unborn child. Talk to your health care professional or pharmacist for more information. Do not breast-feed an infant while taking this medicine or for at least 1 week after stopping it. Men should inform their doctors if they wish to father a child. This medicine may lower sperm counts. Talk with your doctor or health care professional if you are concerned about your fertility. What side effects may I notice from receiving this medicine? Side effects that you should report to your doctor or health care professional as soon as possible:  allergic reactions like skin rash, itching or hives, swelling of the face, lips, or tongue  breathing problems  pain, redness, or irritation at site where injected  signs and symptoms of a dangerous change in heartbeat or heart rhythm like chest pain; dizziness; fast or irregular heartbeat; palpitations; feeling faint or lightheaded, falls; breathing problems  signs of decreased platelets or bleeding - bruising, pinpoint red spots on the skin, black, tarry stools, blood in the urine  signs of decreased red blood cells - unusually weak or  tired, feeling faint or lightheaded, falls  signs of infection - fever or chills, cough, sore throat, pain or difficulty passing urine  signs and symptoms of kidney injury like trouble passing urine or change in the amount of urine  signs and symptoms of liver injury like dark yellow or brown urine; general ill feeling or flu-like symptoms; light-colored stools; loss of appetite; nausea; right upper belly pain; unusually weak or tired; yellowing of the eyes or skin  swelling of ankles, feet, hands Side effects that usually do not require medical attention (report to your doctor or health care professional if they continue or are bothersome):  constipation  diarrhea  hair loss  loss of appetite  nausea  rash  vomiting This list may not describe all possible side effects. Call your doctor for medical advice about side effects. You may report side effects to FDA at 1-800-FDA-1088. Where should I keep my medicine? This drug is given in a hospital or clinic and will not be stored at home. NOTE: This sheet is a summary. It may not cover all possible information. If you have questions about this medicine, talk to your doctor, pharmacist, or health care provider.  2020 Elsevier/Gold Standard (2018-01-10 18:06:11)  

## 2019-07-16 ENCOUNTER — Telehealth: Payer: Self-pay | Admitting: *Deleted

## 2019-07-16 NOTE — Telephone Encounter (Signed)
Telephone call to son and advised lab results, son verbalized an understanding to increase levothyroxine to the whole tablet before breakfast.   Medication list updated.

## 2019-07-16 NOTE — Telephone Encounter (Signed)
-----   Message from Heath Lark, MD sent at 07/16/2019  8:24 AM EDT ----- Regarding: TSH Pls call son TSH is a bit high I recommend increase levothyroxine to full tablet (50 mcg) Please update her med list

## 2019-07-17 ENCOUNTER — Emergency Department (HOSPITAL_COMMUNITY)
Admission: EM | Admit: 2019-07-17 | Discharge: 2019-07-17 | Disposition: A | Discharge status: Home / Self Care | Payer: Medicare Other | Attending: Emergency Medicine | Admitting: Emergency Medicine

## 2019-07-17 ENCOUNTER — Other Ambulatory Visit: Payer: Self-pay

## 2019-07-17 ENCOUNTER — Encounter (HOSPITAL_COMMUNITY): Payer: Self-pay | Admitting: Emergency Medicine

## 2019-07-17 ENCOUNTER — Emergency Department (HOSPITAL_COMMUNITY): Payer: Medicare Other

## 2019-07-17 ENCOUNTER — Telehealth: Payer: Self-pay | Admitting: *Deleted

## 2019-07-17 DIAGNOSIS — I129 Hypertensive chronic kidney disease with stage 1 through stage 4 chronic kidney disease, or unspecified chronic kidney disease: Secondary | ICD-10-CM | POA: Insufficient documentation

## 2019-07-17 DIAGNOSIS — Z79899 Other long term (current) drug therapy: Secondary | ICD-10-CM | POA: Insufficient documentation

## 2019-07-17 DIAGNOSIS — Z9221 Personal history of antineoplastic chemotherapy: Secondary | ICD-10-CM | POA: Insufficient documentation

## 2019-07-17 DIAGNOSIS — N183 Chronic kidney disease, stage 3 (moderate): Secondary | ICD-10-CM | POA: Diagnosis not present

## 2019-07-17 DIAGNOSIS — R410 Disorientation, unspecified: Secondary | ICD-10-CM

## 2019-07-17 DIAGNOSIS — Z91040 Latex allergy status: Secondary | ICD-10-CM | POA: Insufficient documentation

## 2019-07-17 DIAGNOSIS — Z20828 Contact with and (suspected) exposure to other viral communicable diseases: Secondary | ICD-10-CM | POA: Diagnosis not present

## 2019-07-17 DIAGNOSIS — R4182 Altered mental status, unspecified: Secondary | ICD-10-CM | POA: Diagnosis present

## 2019-07-17 DIAGNOSIS — Z8544 Personal history of malignant neoplasm of other female genital organs: Secondary | ICD-10-CM | POA: Insufficient documentation

## 2019-07-17 DIAGNOSIS — R509 Fever, unspecified: Secondary | ICD-10-CM | POA: Diagnosis not present

## 2019-07-17 LAB — CBC WITH DIFFERENTIAL/PLATELET
Abs Immature Granulocytes: 0.03 10*3/uL (ref 0.00–0.07)
Basophils Absolute: 0 10*3/uL (ref 0.0–0.1)
Basophils Relative: 0 %
Eosinophils Absolute: 0 10*3/uL (ref 0.0–0.5)
Eosinophils Relative: 0 %
HCT: 27.5 % — ABNORMAL LOW (ref 36.0–46.0)
Hemoglobin: 9 g/dL — ABNORMAL LOW (ref 12.0–15.0)
Immature Granulocytes: 0 %
Lymphocytes Relative: 6 %
Lymphs Abs: 0.6 10*3/uL — ABNORMAL LOW (ref 0.7–4.0)
MCH: 37 pg — ABNORMAL HIGH (ref 26.0–34.0)
MCHC: 32.7 g/dL (ref 30.0–36.0)
MCV: 113.2 fL — ABNORMAL HIGH (ref 80.0–100.0)
Monocytes Absolute: 0.1 10*3/uL (ref 0.1–1.0)
Monocytes Relative: 1 %
Neutro Abs: 9.4 10*3/uL — ABNORMAL HIGH (ref 1.7–7.7)
Neutrophils Relative %: 93 %
Platelets: 138 10*3/uL — ABNORMAL LOW (ref 150–400)
RBC: 2.43 MIL/uL — ABNORMAL LOW (ref 3.87–5.11)
RDW: 13.3 % (ref 11.5–15.5)
WBC: 10.2 10*3/uL (ref 4.0–10.5)
nRBC: 0 % (ref 0.0–0.2)

## 2019-07-17 LAB — COMPREHENSIVE METABOLIC PANEL
ALT: 17 U/L (ref 0–44)
AST: 27 U/L (ref 15–41)
Albumin: 3.5 g/dL (ref 3.5–5.0)
Alkaline Phosphatase: 66 U/L (ref 38–126)
Anion gap: 10 (ref 5–15)
BUN: 26 mg/dL — ABNORMAL HIGH (ref 8–23)
CO2: 22 mmol/L (ref 22–32)
Calcium: 8.8 mg/dL — ABNORMAL LOW (ref 8.9–10.3)
Chloride: 103 mmol/L (ref 98–111)
Creatinine, Ser: 1.04 mg/dL — ABNORMAL HIGH (ref 0.44–1.00)
GFR calc Af Amer: 58 mL/min — ABNORMAL LOW (ref >60)
GFR calc non Af Amer: 50 mL/min — ABNORMAL LOW (ref >60)
Glucose, Bld: 133 mg/dL — ABNORMAL HIGH (ref 70–99)
Potassium: 3.9 mmol/L (ref 3.5–5.1)
Sodium: 135 mmol/L (ref 135–145)
Total Bilirubin: 0.5 mg/dL (ref 0.3–1.2)
Total Protein: 6.7 g/dL (ref 6.5–8.1)

## 2019-07-17 LAB — URINALYSIS, ROUTINE W REFLEX MICROSCOPIC
Bilirubin Urine: NEGATIVE
Glucose, UA: NEGATIVE mg/dL
Hgb urine dipstick: NEGATIVE
Ketones, ur: NEGATIVE mg/dL
Leukocytes,Ua: NEGATIVE
Nitrite: NEGATIVE
Protein, ur: NEGATIVE mg/dL
Specific Gravity, Urine: 1.015 (ref 1.005–1.030)
pH: 5 (ref 5.0–8.0)

## 2019-07-17 LAB — LACTIC ACID, PLASMA
Lactic Acid, Venous: 0.7 mmol/L (ref 0.5–1.9)
Lactic Acid, Venous: 1 mmol/L (ref 0.5–1.9)

## 2019-07-17 LAB — SARS CORONAVIRUS 2 BY RT PCR (HOSPITAL ORDER, PERFORMED IN ~~LOC~~ HOSPITAL LAB): SARS Coronavirus 2: NEGATIVE

## 2019-07-17 MED ORDER — VANCOMYCIN HCL IN DEXTROSE 1-5 GM/200ML-% IV SOLN
1000.0000 mg | Freq: Once | INTRAVENOUS | Status: AC
  Administered 2019-07-17: 1000 mg via INTRAVENOUS
  Filled 2019-07-17: qty 200

## 2019-07-17 MED ORDER — SODIUM CHLORIDE 0.9 % IV SOLN
2.0000 g | Freq: Once | INTRAVENOUS | Status: AC
  Administered 2019-07-17: 2 g via INTRAVENOUS
  Filled 2019-07-17: qty 2

## 2019-07-17 MED ORDER — HEPARIN SOD (PORK) LOCK FLUSH 100 UNIT/ML IV SOLN
500.0000 [IU] | Freq: Once | INTRAVENOUS | Status: AC
  Administered 2019-07-17: 500 [IU]
  Filled 2019-07-17: qty 5

## 2019-07-17 MED ORDER — ACETAMINOPHEN 325 MG PO TABS
650.0000 mg | ORAL_TABLET | Freq: Once | ORAL | Status: AC
  Administered 2019-07-17: 650 mg via ORAL
  Filled 2019-07-17: qty 2

## 2019-07-17 MED ORDER — SODIUM CHLORIDE 0.9 % IV BOLUS
1000.0000 mL | Freq: Once | INTRAVENOUS | Status: AC
  Administered 2019-07-17: 1000 mL via INTRAVENOUS

## 2019-07-17 NOTE — ED Provider Notes (Signed)
[facility] DEPT Provider Note   CSN: [id number] Arrival date & time: 07/17/19  1037     History   Chief Complaint Chief Complaint  Patient presents with  . Nausea  . Altered Mental Status  . Fatigue    HPI Lisa Moyer is a 84 y.o. female hx of hyperlipidemia, hypertension, here presenting with altered mental status, fever.  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  She was progressively confused since yesterday .  Today daughter noticed a fever of 101.3 at home.  Patient's confusion also got worse.  She was also noted to have oxygen level in the low 90s.  She recently went to [place] for a week to be with family.  Denies any sick contacts.  Denies any urinary symptoms.      The history is provided by a relative.  Level V caveat- condition of patient. Daughter wanted to translate instead of using interpreter services   Past Medical History:  Diagnosis Date  . Arthritis    mild  . Blood transfusion without reported diagnosis   . DDD (degenerative disc disease), lumbar 05/09/2018   CT shows multilevel degenerative hypertrophic changes worst at L2-3  . Diverticulitis   . Fallopian tube cancer, carcinoma ([place])   . Fatty liver 06/28/2018   seen on CT  . Hyperlipemia   . Hypertension   . Kidney stone 05/2018   Right 21mm nonobstructing renal stone seen on CT - pt asymptomatic  . Pneumonia   . Sliding hiatal hernia 06/28/2018   seen on CT    Patient Active Problem List   Diagnosis Date Noted  . Cancer associated pain 07/05/2019  . Ovarian cancer ([place]) 04/23/2019  . Insomnia disorder 04/04/2019  . Acquired hypothyroidism 03/15/2019  . Abdominal pain 02/22/2019  . Other proteinuria 02/22/2019  . CKD (chronic kidney disease), stage III ([place]) 02/22/2019  . Nausea & vomiting 02/22/2019  . Port-A-Cath in place 02/07/2019  . Physical debility 01/11/2019  . Goals of care, counseling/discussion  01/11/2019  . Dyspnea on exertion 01/01/2019  . Pancytopenia, acquired ([place]) 01/01/2019  . Essential hypertension 01/01/2019  . Anemia due to antineoplastic chemotherapy 12/31/2018  . Deficiency anemia 12/31/2018  . Diarrhea 12/31/2018  . Other fatigue 12/31/2018  . Fallopian tube cancer, carcinoma ([place]) 12/24/2018  . Diverticulitis of colon 06/27/2018    Past Surgical History:  Procedure Laterality Date  . ABDOMINAL HYSTERECTOMY  08/2016   total with BSO due to fallopian tube carcinoma, and pre-adjuvent and post-operative chemo as well each x 3 mos  . CHOLECYSTECTOMY  2001  . OTHER SURGICAL HISTORY  2017-2018   Chemotherapy      OB History   No obstetric history on file.      Home Medications    Prior to Admission medications   Medication Sig Start Date End Date Taking? Authorizing Provider  Acetaminophen 325 MG CAPS Take 650 mg by mouth every 6 (six) hours as needed.   Yes [provider]  amLODipine (NORVASC) 10 MG tablet Take 1 tablet (10 mg total) by mouth daily. 05/20/19  Yes Gorsuch, Ni, MD  atenolol (TENORMIN) 25 MG tablet Take 0.5 tablets (12.5 mg total) by mouth daily. 02/21/19  Yes Gorsuch, Ernst Spell, MD  Cholecalciferol (VITAMIN D PO) Take 1 tablet by mouth daily. D3 1000units   Yes [provider]  hydrochlorothiazide (MICROZIDE) 12.5 MG capsule Take 12.5 mg by mouth daily.   Yes [provider]  levothyroxine (  SYNTHROID) 50 MCG tablet Take 0.5 tablets (25 mcg total) by mouth daily before breakfast. Patient taking differently: Take 50 mcg by mouth daily before breakfast.  03/15/19  Yes Gorsuch, Ni, MD  losartan (COZAAR) 100 MG tablet Take 1 tablet (100 mg total) by mouth daily. 03/07/19  Yes Gorsuch, Ni, MD  morphine (MS CONTIN) 15 MG 12 hr tablet Take 1 tablet (15 mg total) by mouth every 12 (twelve) hours. 07/05/19  Yes Heath Lark, MD  Multiple Vitamin (MULTIVITAMIN) tablet Take 1 tablet by mouth daily.   Yes [provider]  ondansetron  (ZOFRAN) 8 MG tablet Take 1 tablet (8 mg total) by mouth every 8 (eight) hours as needed for nausea or vomiting. 07/15/19  Yes Gorsuch, Ernst Spell, MD  oxyCODONE-acetaminophen (PERCOCET/ROXICET) 5-325 MG tablet Take 1 tablet by mouth every 4 (four) hours as needed for severe pain. 07/05/19  Yes Gorsuch, Ni, MD  polyethylene glycol (MIRALAX / GLYCOLAX) 17 g packet Take 17 g by mouth 2 (two) times daily.   Yes [provider]  escitalopram (LEXAPRO) 10 MG tablet Take 1 tablet (10 mg total) by mouth daily. Patient not taking: Reported on 07/17/2019 01/10/19   Heath Lark, MD  olaparib (LYNPARZA) 100 MG tablet Take 2 tablets (200 mg total) by mouth daily. Patient not taking: Reported on 07/17/2019 06/11/19   Heath Lark, MD    Family History Family History  Problem Relation Age of Onset  . Heart disease Mother        MI cause of death  . Colon cancer Mother        colon  . Pancreatic cancer Father        pancreatic    Social History Social History   Tobacco Use  . Smoking status: Never Smoker  . Smokeless tobacco: Never Used  Substance Use Topics  . Alcohol use: Never    Frequency: Never  . Drug use: Never     Allergies   Latex   Review of Systems Review of Systems  Constitutional: Positive for fever.  Psychiatric/Behavioral: Positive for confusion.  All other systems reviewed and are negative.    Physical Exam Updated Vital Signs BP (!) 124/55   Pulse 69   Temp 100.2 F (37.9 C)   Resp 15   Ht 5\' 2"  (1.575 m)   Wt 68 kg   SpO2 98%   BMI 27.44 kg/m   Physical Exam Vitals signs and nursing note reviewed.  HENT:     Head: Normocephalic.     Nose: Nose normal.     Mouth/Throat:     Mouth: Mucous membranes are dry.  Eyes:     Extraocular Movements: Extraocular movements intact.     Pupils: Pupils are equal, round, and reactive to light.  Neck:     Musculoskeletal: Normal range of motion.     Comments: No meningeal signs  Cardiovascular:     Rate and Rhythm:  Normal rate and regular rhythm.     Pulses: Normal pulses.     Heart sounds: Normal heart sounds.  Pulmonary:     Effort: Pulmonary effort is normal.     Comments: Port R chest with no signs of cellulitis. Crackles bilateral bases  Abdominal:     General: Abdomen is flat.     Palpations: Abdomen is soft.  Musculoskeletal: Normal range of motion.  Skin:    General: Skin is warm.     Capillary Refill: Capillary refill takes less than 2 seconds.  Neurological:  Mental Status: She is alert.     Comments: A & O x 2. Strength 4/5 bilaterally. Nl sensation throughout   Psychiatric:        Mood and Affect: Mood normal.      ED Treatments / Results  Labs (all labs ordered are listed, but only abnormal results are displayed) Labs Reviewed  CBC WITH DIFFERENTIAL/PLATELET - Abnormal; Notable for the following components:      Result Value   RBC 2.43 (*)    Hemoglobin 9.0 (*)    HCT 27.5 (*)    MCV 113.2 (*)    MCH 37.0 (*)    Platelets 138 (*)    Neutro Abs 9.4 (*)    Lymphs Abs 0.6 (*)    All other components within normal limits  COMPREHENSIVE METABOLIC PANEL - Abnormal; Notable for the following components:   Glucose, Bld 133 (*)    BUN 26 (*)    Creatinine, Ser 1.04 (*)    Calcium 8.8 (*)    GFR calc non Af Amer 50 (*)    GFR calc Af Amer 58 (*)    All other components within normal limits  CULTURE, BLOOD (ROUTINE X 2)  CULTURE, BLOOD (ROUTINE X 2)  SARS CORONAVIRUS 2 (HOSPITAL ORDER, [facility])  URINE CULTURE  LACTIC ACID, PLASMA  URINALYSIS, ROUTINE W REFLEX MICROSCOPIC  LACTIC ACID, PLASMA    EKG None  Radiology Dg Chest Port 1 View  Result Date: 07/17/2019 CLINICAL DATA:  Hypoxemia, confusion and fatigue. Undergoing chemotherapy for GYN malignancy. EXAM: PORTABLE CHEST 1 VIEW COMPARISON:  Radiographs 12/31/2018.  Abdominal CT 07/05/2019. FINDINGS: 1141 hours. There are lower lung volumes. Right IJ Port-A-Cath tip is unchanged at  the lower SVC level. The heart size and mediastinal contours are stable for the lesser degree of inspiration. There is mild bibasilar atelectasis, vascular congestion and possible mild edema. No confluent airspace opacity, significant pleural effusion or pneumothorax. The bones appear unchanged. IMPRESSION: Lower lung volumes with bibasilar atelectasis, vascular congestion and possible mild pulmonary edema. No confluent airspace opacity. Electronically Signed   By: Richardean Sale M.D.   On: 07/17/2019 11:58    Procedures Procedures (including critical care time)  Medications Ordered in ED Medications  acetaminophen (TYLENOL) tablet 650 mg (650 mg Oral Given 07/17/19 1212)  sodium chloride 0.9 % bolus 1,000 mL (1,000 mLs Intravenous New Bag/Given 07/17/19 1212)  vancomycin (VANCOCIN) IVPB 1000 mg/200 mL premix (0 mg Intravenous Stopped 07/17/19 1352)  ceFEPIme (MAXIPIME) 2 g in sodium chloride 0.9 % 100 mL IVPB (0 g Intravenous Stopped 07/17/19 1252)     Initial Impression / Assessment and Plan / ED Course  I have reviewed the triage vital signs and the nursing notes.  Pertinent labs & imaging results that were available during my care of the patient were reviewed by me and considered in my medical decision making (see chart for details).       Lisa Moyer is a 84 y.o. female here with confusion, fever. She is currently on chemo. Concerned for bacteremia vs UTI vs pneumonia. Also traveled to [place] recently so consider COVID as well. Will get labs, lactate, cultures, CXR, UA. Consider neutropenic fever so will give vanc/cefepime empirically.   2:14 PM Labs showed normal WBC count. Lactate nl. UA nl. CXR clear. No clear etiology of her fever. Mental status improved. Likely viral syndrome at this point. Urine and blood cultures sent. I offered admission for observation vs close follow  up. She has follow up with oncology. If cultures positive, will call patient for admission. Daughter in  agreement    Final Clinical Impressions(s) / ED Diagnoses   Final diagnoses:  None    ED Discharge Orders    None       Drenda Freeze, MD 07/17/19 1422

## 2019-07-17 NOTE — ED Notes (Signed)
Pure wick has been placed. Suction set to 45mmHg.  

## 2019-07-17 NOTE — ED Triage Notes (Signed)
Pt arrived by EMS from home. Pt is cancer pt, received chemo on Monday for fallopian tube cancer. Pt has a PICC, not sure if she still has port access. Pt mostly speaks [place]. SpO2 in low 90s on RA. Hx of hypertension. Pt complaining of feeling tired and confused.  Highest temp at home was 101.3 per daughter  Daughter reports pt took her levothyroxine this morning.  BP 140/80 HR 72 RR 18 CBG 160 Temp 99

## 2019-07-17 NOTE — Discharge Instructions (Signed)
Continue your current meds   Take tylenol, motrin for fever. Take your temperature at home   See your oncologist this week   Blood and urine cultures as sent. If they are positive, you will be called   Return to ER if you have fever > 101 for 2 days, worse confusion, weakness, vomiting

## 2019-07-17 NOTE — ED Triage Notes (Addendum)
Patient arrived by EMS from home. Pt c/o weakness, fever, and confusion that started last night.   Pt's primary language is [place].  Hx of HTN    Pt has fallopian tube cancer.    Pt received chemotherapy on Monday.   Patient took 12 mg of Zofran this morning per EMS.

## 2019-07-17 NOTE — Telephone Encounter (Signed)
Telephone call received from daughter. Patient was doing very well yesterday following her first Gemzar. This morning she has a fever of 102.3 and is very confused and weak. Daughter says she is not responding, and taking a long time to reply to questions. She was unable to get from her bathroom back to bed without direction "like she didn't know where she was". Daughter advised to bring her to the ED at [facility]. Daughter was unsure her capability to get her out of the house. Encouraged her to call 911 to transport her. Daughter states she is going to do that now.

## 2019-07-18 ENCOUNTER — Telehealth: Payer: Self-pay | Admitting: *Deleted

## 2019-07-18 LAB — URINE CULTURE: Culture: NO GROWTH

## 2019-07-18 MED ORDER — ONDANSETRON 8 MG PO TBDP: 8.0000 mg | ORAL_TABLET | Freq: Three times a day (TID) | ORAL | 3 refills | Status: AC | PRN

## 2019-07-18 NOTE — Addendum Note (Signed)
Addended by: Jethro Bolus A on: 07/18/2019 11:37 AM   Modules accepted: Orders

## 2019-07-18 NOTE — Telephone Encounter (Signed)
Telephone call to patient's son. He is out of town right now and his sister is staying with the patient. He reports someone will be able to come along with her to the appointment on Monday.

## 2019-07-18 NOTE — Telephone Encounter (Signed)
pls call in to local pharmacy 30 tabs 3 refills

## 2019-07-18 NOTE — Telephone Encounter (Signed)
Daughter is requesting Zofran ODT for issues with future nausea. She feels the regular Zofran caused the patient to vomit and she threw up the medication.

## 2019-07-22 ENCOUNTER — Inpatient Hospital Stay: Payer: Medicare Other

## 2019-07-22 ENCOUNTER — Other Ambulatory Visit: Payer: Self-pay

## 2019-07-22 ENCOUNTER — Telehealth: Payer: Self-pay | Admitting: Hematology and Oncology

## 2019-07-22 ENCOUNTER — Inpatient Hospital Stay (HOSPITAL_BASED_OUTPATIENT_CLINIC_OR_DEPARTMENT_OTHER): Payer: Medicare Other | Admitting: Hematology and Oncology

## 2019-07-22 ENCOUNTER — Encounter: Payer: Self-pay | Admitting: Hematology and Oncology

## 2019-07-22 DIAGNOSIS — C5700 Malignant neoplasm of unspecified fallopian tube: Secondary | ICD-10-CM

## 2019-07-22 DIAGNOSIS — D6481 Anemia due to antineoplastic chemotherapy: Secondary | ICD-10-CM | POA: Diagnosis not present

## 2019-07-22 DIAGNOSIS — Z7189 Other specified counseling: Secondary | ICD-10-CM

## 2019-07-22 DIAGNOSIS — G893 Neoplasm related pain (acute) (chronic): Secondary | ICD-10-CM | POA: Diagnosis not present

## 2019-07-22 DIAGNOSIS — Z95828 Presence of other vascular implants and grafts: Secondary | ICD-10-CM

## 2019-07-22 DIAGNOSIS — I10 Essential (primary) hypertension: Secondary | ICD-10-CM

## 2019-07-22 DIAGNOSIS — A689 Relapsing fever, unspecified: Secondary | ICD-10-CM | POA: Insufficient documentation

## 2019-07-22 DIAGNOSIS — R1030 Lower abdominal pain, unspecified: Secondary | ICD-10-CM

## 2019-07-22 DIAGNOSIS — D539 Nutritional anemia, unspecified: Secondary | ICD-10-CM

## 2019-07-22 DIAGNOSIS — E039 Hypothyroidism, unspecified: Secondary | ICD-10-CM

## 2019-07-22 DIAGNOSIS — T451X5A Adverse effect of antineoplastic and immunosuppressive drugs, initial encounter: Secondary | ICD-10-CM

## 2019-07-22 DIAGNOSIS — Z5111 Encounter for antineoplastic chemotherapy: Secondary | ICD-10-CM | POA: Diagnosis not present

## 2019-07-22 LAB — CBC WITH DIFFERENTIAL/PLATELET
Abs Immature Granulocytes: 0.1 10*3/uL — ABNORMAL HIGH (ref 0.00–0.07)
Basophils Absolute: 0 10*3/uL (ref 0.0–0.1)
Basophils Relative: 1 %
Eosinophils Absolute: 0 10*3/uL (ref 0.0–0.5)
Eosinophils Relative: 1 %
HCT: 24.6 % — ABNORMAL LOW (ref 36.0–46.0)
Hemoglobin: 8.1 g/dL — ABNORMAL LOW (ref 12.0–15.0)
Immature Granulocytes: 2 %
Lymphocytes Relative: 23 %
Lymphs Abs: 1 10*3/uL (ref 0.7–4.0)
MCH: 37 pg — ABNORMAL HIGH (ref 26.0–34.0)
MCHC: 32.9 g/dL (ref 30.0–36.0)
MCV: 112.3 fL — ABNORMAL HIGH (ref 80.0–100.0)
Monocytes Absolute: 0.4 10*3/uL (ref 0.1–1.0)
Monocytes Relative: 10 %
Neutro Abs: 2.8 10*3/uL (ref 1.7–7.7)
Neutrophils Relative %: 63 %
Platelets: 80 10*3/uL — ABNORMAL LOW (ref 150–400)
RBC: 2.19 MIL/uL — ABNORMAL LOW (ref 3.87–5.11)
RDW: 13.1 % (ref 11.5–15.5)
WBC: 4.4 10*3/uL (ref 4.0–10.5)
nRBC: 0.5 % — ABNORMAL HIGH (ref 0.0–0.2)

## 2019-07-22 LAB — TSH: TSH: 3.631 u[IU]/mL (ref 0.308–3.960)

## 2019-07-22 LAB — COMPREHENSIVE METABOLIC PANEL
ALT: 15 U/L (ref 0–44)
AST: 28 U/L (ref 15–41)
Albumin: 2.9 g/dL — ABNORMAL LOW (ref 3.5–5.0)
Alkaline Phosphatase: 80 U/L (ref 38–126)
Anion gap: 7 (ref 5–15)
BUN: 16 mg/dL (ref 8–23)
CO2: 25 mmol/L (ref 22–32)
Calcium: 9 mg/dL (ref 8.9–10.3)
Chloride: 105 mmol/L (ref 98–111)
Creatinine, Ser: 0.85 mg/dL (ref 0.44–1.00)
GFR calc Af Amer: >60 mL/min (ref >60)
GFR calc non Af Amer: >60 mL/min (ref >60)
Glucose, Bld: 106 mg/dL — ABNORMAL HIGH (ref 70–99)
Potassium: 4.1 mmol/L (ref 3.5–5.1)
Sodium: 137 mmol/L (ref 135–145)
Total Bilirubin: <0.2 mg/dL — ABNORMAL LOW (ref 0.3–1.2)
Total Protein: 6.5 g/dL (ref 6.5–8.1)

## 2019-07-22 LAB — CULTURE, BLOOD (ROUTINE X 2)
Culture: NO GROWTH
Culture: NO GROWTH
Special Requests: ADEQUATE
Special Requests: ADEQUATE

## 2019-07-22 MED ORDER — SODIUM CHLORIDE 0.9% FLUSH
10.0000 mL | Freq: Once | INTRAVENOUS | Status: AC
  Administered 2019-07-22: 13:00:00 10 mL
  Filled 2019-07-22: qty 10

## 2019-07-22 NOTE — Assessment & Plan Note (Signed)
She has recurrent abdominal pain and she felt bloated Examination does not reveal significant ascites She has no pain on palpation I suspect this is related to her cancer For now, I do not believe she needs therapeutic paracentesis

## 2019-07-22 NOTE — Assessment & Plan Note (Signed)
She is experiencing significant side effects of treatment We will hold treatment today due to severe pancytopenia and recent side effects I plan to reduce the dose of Gemzar to 600 mg per metered square and restart next week We will continue aggressive supportive care management

## 2019-07-22 NOTE — Patient Instructions (Signed)

## 2019-07-22 NOTE — Assessment & Plan Note (Signed)
She has recurrent fever and night sweats likely secondary to tumor fever She had extensive work-up in the emergency room without any specific growth from her urine culture, blood culture or chest x-ray She can continue to take Tylenol as needed

## 2019-07-22 NOTE — Assessment & Plan Note (Signed)
She has poorly controlled pain I recommend increasing the dose of MS Contin to 30 mg twice a day We will reassess pain management next week

## 2019-07-22 NOTE — Telephone Encounter (Signed)
I talk with patient regarding schedule  

## 2019-07-22 NOTE — Assessment & Plan Note (Signed)
I had extensive discussion with the patient and family about goals of care The patient has significant changes in quality of life related to recurrence of cancer and recent side effects The patient and family are wondering about whether she should continue on treatment Her performance status is still reasonable I plan to continue on aggressive supportive care and reduce the dose of her chemo in the next visit However, if she continues to have numerous side effects, we might discontinue treatment in the future They agree with the plan of care

## 2019-07-22 NOTE — Assessment & Plan Note (Signed)
She developed significant pancytopenia from treatment We will hold treatment today and plan to reduce dose in the future She does not need transfusion support

## 2019-07-22 NOTE — Progress Notes (Signed)
[place] OFFICE PROGRESS NOTE  Patient Care Team: Patient, No Pcp Per as PCP - General ([place]) Ileana Roup, MD as Consulting Physician (General Surgery)  ASSESSMENT & PLAN:  Fallopian tube cancer, carcinoma ([place]) She is experiencing significant side effects of treatment We will hold treatment today due to severe pancytopenia and recent side effects I plan to reduce the dose of Gemzar to 600 mg per metered square and restart next week We will continue aggressive supportive care management  Cancer associated pain She has poorly controlled pain I recommend increasing the dose of MS Contin to 30 mg twice a day We will reassess pain management next week  Anemia due to antineoplastic chemotherapy She developed significant pancytopenia from treatment We will hold treatment today and plan to reduce dose in the future She does not need transfusion support  Essential hypertension She has wide fluctuation of her blood pressure I recommend holding off treatment with hydrochlorothiazide if she is not able to drink enough liquid to prevent risk of dehydration  Recurrent fever She has recurrent fever and night sweats likely secondary to tumor fever She had extensive work-up in the emergency room without any specific growth from her urine culture, blood culture or chest x-ray She can continue to take Tylenol as needed  Abdominal pain She has recurrent abdominal pain and she felt bloated Examination does not reveal significant ascites She has no pain on palpation I suspect this is related to her cancer For now, I do not believe she needs therapeutic paracentesis  Goals of care, counseling/discussion I had extensive discussion with the patient and family about goals of care The patient has significant changes in quality of life related to recurrence of cancer and recent side effects The patient and family are wondering about whether she should continue on  treatment Her performance status is still reasonable I plan to continue on aggressive supportive care and reduce the dose of her chemo in the next visit However, if she continues to have numerous side effects, we might discontinue treatment in the future They agree with the plan of care   No orders of the defined types were placed in this encounter.   INTERVAL HISTORY: Please see below for problem oriented charting. She returns today with her daughter present Her son is also available on telephone conference over the phone Since her first dose of gemcitabine recently, she developed fever and went to the emergency room for evaluation Urine culture, blood culture and chest x-ray were unremarkable She continues to have regular fever and night sweats She also have abdominal swelling and pain The family members have very concerned about her quality of life They brought with her documentation of her vital signs, tumor fever and had numerous questions which I attempted to address to the best of my ability She denies nausea or constipation She is waking up at night because of uncontrolled pain and complains of fatigue during daytime Her oral intake is poor The patient is less motivated to continue on treatment whenever she does not feel well Family members are also wondering whether she should be taking an antidepressant such as Lexapro Her daughter is currently staying with her to provide additional support  SUMMARY OF ONCOLOGIC HISTORY: Oncology History Overview Note  Neg genetics.  HRD test was done on peritoneal fluid from 07/18/2018: HRD +   Fallopian tube cancer, carcinoma ([place])  06/18/2016 Tumor Marker   Patient's tumor was tested for the following markers: CA-125 Results of  the tumor marker test revealed 298.   06/23/2016 PET scan   Diffuse hypermetabolic peritoneal carcinomatosis in the abdomen and pelvis, internal mammary adenopathy in the chest, hypermetabolic right  paracardiac/diaphragmatic LN, bilateral pleural effusion and moderate ascites   06/24/2016 Procedure   Therapeutic paracentesis   06/24/2016 Pathology Results   Positive for adenocarcinoma, Mullerian primary   06/28/2016 Procedure   She underwent CT guided biopsy of omentum   06/28/2016 Pathology Results   High grade serous involving the omentum, positive for p53, PAX8, WT1 and p16.   07/01/2016 Procedure   Findings/impression:   1. Sonographic evaluation of the right internal jugular vein confirms patency. Sonography was required to gain central venous access. An image documenting patency and needle access was saved.   2. Successful placement of right internal jugular vein subcutaneous Mediport as described. Spot film shows the catheter tip at the cavoatrial junction. No pneumothorax. The Mediport aspirates and flushes normally.   07/02/2016 - 08/12/2016 Chemotherapy   The patient had 3 cycles of carboplatin and taxol chemotherapy treatment.     08/29/2016 Imaging   Ct scan of chest, abdomen and pelvis showed marked improvement and resolution of thoracic adenopathy   09/08/2016 Surgery   She underwent interval debulking surgery with TAH/BSO and infracolic omentectomy by Dr. Wynelle Cleveland   09/08/2016 Pathology Results   Right tube: scant serous carcinoma. No viable tumor. Left tube and ovary: normal. Omentum: low volume serous carcinoma with few psamomma bodies.   10/07/2016 - 11/18/2016 Chemotherapy   The patient had 3 more cycles of carboplatin and taxol for chemotherapy treatment.     02/07/2017 Imaging   Impression:  1. Colonic diverticulosis without acute diverticulitis. 2. Stable calcification right kidney without hydronephrosis. 3. No acute process or significant interval change has occurred since August 29, 2016.   05/09/2018 Imaging   Impression:  Findings are most suspicious for acute sigmoid colonic diverticulitis with intraloop abscess formation.   05/14/2018 Imaging    Impression: The pelvic fluid collection contiguous with sigmoid colonic diverticulitis changes has not significant change since May 09, 2018. No new fluid collections are seen   06/28/2018 Imaging   IMPRESSION: Changes consistent with diverticulitis with free fluid within the pelvis. No focal contained abscess is seen. A previously noted fluid collection deep within the pelvis has resolved in the interval.   Stable nonobstructing right renal stone.  The remainder of the exam is stable from the prior study.    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 There is omental caking as well as stranding within various areas of the peritoneal fat. There is also wall thickening of the sigmoid colon with adjacent stranding. This finding can also be related to peritoneal carcinomatosis and implants although an inflammatory process of the sigmoid colon is not excluded.   Right nephrolithiasis.  Ileus pattern.   07/16/2018 Imaging   Impression:   Findings of peritoneal carcinomatosis, rapidly progressive. Not present in July 2019, progressed when compared to September 13.  Multiple bowel loops are distorted but no distention or transition zone to suggest a component of active obstruction.   07/18/2018 Procedure   Findings/impression:  1. Sonographic evaluation of ascites. Sonography was required to gain access to ascites. An image documenting ascites was saved. 2. Successful ultrasound guided paracentesis using a temporary peritoneal drainage catheter inserted in the right upper quadrant as described yielding  600cc of ascites.    07/20/2018 Echocardiogram   General:  The patient was in normal sinus rhythm.  Left Ventricle:  Left ventricular ejection fraction was normal, estimated in the range of 60 to 65%. The left ventricular  cavity size appears normal. The left ventricular wall thickness appears normal. The left ventricle is thickened in a fashion  consistent with mild concentric hypertrophy. Doppler tissue velocities and mitral inflow profile are consistent with Stage I diastolic dysfunction. The calculated ejection fraction is 68.1 %.  Right Ventricle:  The right ventricle appears normal in size and function.  Left Atrium:  The left atrium appears normal.  Right Atrium:  The right atrium appears normal.  Aortic Valve:  The structure of the aortic valve is tricuspid. There is evidence of trivial (trace) aortic regurgitation.  Mitral Valve:  There is no evidence of mitral regurgitation. The mitral valve appears normal in structure.  Pulmonic Valve:  There is evidence of trace (trivial) pulmonic regurgitation. The pulmonic valve appears normal in structure.  Tricuspid Valve:  There is evidence of trace (trivial) tricuspid regurgitation. The tricuspid valve appears normal in structure.  Pericardium:  There is no evidence of pericardial effusion.  Aorta:  The visualized portions of the aorta (ascending aorta, aortic root, and aortic arch) appear normal.  Pulmonic Artery:  Unable to obtain RVSP due to insufficient tricuspid regurgitation jet.  Conclusions: Left ventricular ejection fraction was normal, estimated in the range of 60 to 65%. The left ventricular cavity size appears normal. The left ventricular wall thickness appears normal. The left ventricle is thickened in a fashion consistent with mild concentric hypertrophy. Doppler tissue velocities and mitral inflow profile are consistent with Stage I diastolic dysfunction. The calculated ejection fraction is 68.1%.There is evidence of trivial (trace) aortic regurgitation.There is evidence of trace (trivial) pulmonic regurgitation.There is evidence of trace (trivial) tricuspid regurgitation.There is no evidence of pericardial effusion.Unable to obtain RVSP due to  insufficient tricuspid regurgitation jet.   07/23/2018 - 12/25/2018 Chemotherapy   The patient had carboplatin, doxil and Avastin for chemotherapy treatment x 6 cycles   08/01/2018 Imaging   Ct head showed no injuries.   08/01/2018 Procedure   Findings/impression:  1. Sonographic evaluation of ascites. Sonography was required to gain access to ascites. An image documenting ascites was saved. 2. Successful ultrasound guided paracentesis using a temporary peritoneal drainage catheter inserted in the right upper quadrant as described yielding 400cc of ascites.    08/06/2018 Tumor Marker   Patient's tumor was tested for the following markers: CA-125 Results of the tumor marker test revealed 373   08/20/2018 Tumor Marker   Patient's tumor was tested for the following markers: CA-125 Results of the tumor marker test revealed 51.   10/03/2018 Genetic Testing   Patient has genetic testing done for genetics testing using her saliva. 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  Liver and spleen homogeneously enhance. No lesion seen.  The adjacent gallbladder is surgically absent.  The adrenal glands, kidneys, pancreas are normal.  Pelvic contents are remarkable for an absent uterus.  The mesenteric stranding has nearly completely resolved. Minimal reticular prominence throughout the pelvis.  No residual ascites.  No bowel wall thickening or. No dilated loops or transition zone.  The appendix is not seen. But no inflammatory changes in the right lower quadrant.  Osseous structures are intact.   11/27/2018 Imaging  Ct scan abdomen and pelvis showed colonic diverticulosis and nonspecific fluid levels in the bowel   12/24/2018 Cancer Staging   Staging form: Ovary, Fallopian Tube, and Primary Peritoneal Carcinoma, AJCC 8th Edition - Pathologic: Stage IVB (rpT3a, pN0, pM1b) - Signed by  Heath Lark, MD on 12/24/2018   12/31/2018 Tumor Marker   Patient's tumor was tested for the following markers: CA-125 Results of the tumor marker test revealed 14.4   01/01/2019 Imaging   1. LEFT jugular Port-A-Cath catheter tip projects over the mid SVC. 2. No acute cardiopulmonary disease.    01/04/2019 Echocardiogram   1. The left ventricle has normal systolic function with an ejection fraction of 60-65%. The cavity size was normal. Left ventricular diastolic Doppler parameters are consistent with impaired relaxation.  2. The right ventricle has normal systolic function. The cavity was normal. There is no increase in right ventricular wall thickness.  3. The mitral valve is normal in structure.  4. The tricuspid valve is normal in structure.  5. The aortic valve is normal in structure.  6. The pulmonic valve was normal in structure.  7. 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    02/13/2019 -  Chemotherapy   The patient is started on Lynparza due to [place]    04/22/2019 Tumor Marker   Patient's tumor was tested for the following markers: CA-125 Results of the tumor marker test revealed 44.8   04/26/2019 Imaging   1. No evidence of recurrent or metastatic carcinoma within the abdomen or pelvis. 2. Colonic diverticulosis, without radiographic evidence of diverticulitis or other acute findings.   Aortic Atherosclerosis (ICD10-I70.0).   05/28/2019 Tumor Marker   Patient's tumor was tested for the following markers: CA-125 Results of the tumor marker test revealed 104   07/05/2019 Imaging   1. Imaging findings consistent with recurrent peritoneal carcinomatosis. This is most apparent within the left abdomen involving the transverse colon and descending colon where there is increased soft tissue, loculated fluid and colonic serosal soft tissue thickening. 2. Increase in size of retroperitoneal and pelvic lymph nodes compatible with metastatic adenopathy. 3. No evidence  for bowel obstruction.     07/05/2019 Tumor Marker   Patient's tumor was tested for the following markers: CA-125 Results of the tumor marker test revealed 233   07/15/2019 -  Chemotherapy   The patient had gemcitabine (GEMZAR) 1,368 mg in sodium chloride 0.9 % 250 mL chemo infusion, 800 mg/m2 = 1,368 mg (80 % of original dose 1,000 mg/m2), Intravenous,  Once, 1 of 4 cycles Dose modification: 800 mg/m2 (80 % of original dose 1,000 mg/m2, Cycle 1, Reason: Patient Age), 600 mg/m2 (60 % of original dose 1,000 mg/m2, Cycle 1, Reason: Dose Not Tolerated) Administration: 1,368 mg (07/15/2019)  for chemotherapy treatment.      REVIEW OF SYSTEMS:   Eyes: Denies blurriness of vision Ears, nose, mouth, throat, and face: Denies mucositis or sore throat Respiratory: Denies cough, dyspnea or wheezes Cardiovascular: Denies palpitation, chest discomfort or lower extremity swelling Skin: Denies abnormal skin rashes Lymphatics: Denies new lymphadenopathy or easy bruising All other systems were reviewed with the patient and are negative.  I have reviewed the past medical history, past surgical history, social history and family history with the patient and they are unchanged from previous note.  ALLERGIES:  is allergic to latex.  MEDICATIONS:  Current Outpatient Medications  Medication Sig Dispense Refill  . Acetaminophen 325 MG CAPS Take 650 mg by mouth every 6 (six) hours  as needed.    Marland Kitchen amLODipine (NORVASC) 10 MG tablet Take 1 tablet (10 mg total) by mouth daily. 90 tablet 9  . atenolol (TENORMIN) 25 MG tablet Take 0.5 tablets (12.5 mg total) by mouth daily. 30 tablet 11  . Cholecalciferol (VITAMIN D PO) Take 1 tablet by mouth daily. D3 1000units    . escitalopram (LEXAPRO) 10 MG tablet Take 1 tablet (10 mg total) by mouth daily. (Patient not taking: Reported on 07/17/2019) 90 tablet 1  . hydrochlorothiazide (MICROZIDE) 12.5 MG capsule Take 12.5 mg by mouth daily.    Marland Kitchen levothyroxine (SYNTHROID) 50  MCG tablet Take 0.5 tablets (25 mcg total) by mouth daily before breakfast. (Patient taking differently: Take 50 mcg by mouth daily before breakfast. ) 30 tablet 3  . losartan (COZAAR) 100 MG tablet Take 1 tablet (100 mg total) by mouth daily. 30 tablet 0  . morphine (MS CONTIN) 15 MG 12 hr tablet Take 1 tablet (15 mg total) by mouth every 12 (twelve) hours. 60 tablet 0  . Multiple Vitamin (MULTIVITAMIN) tablet Take 1 tablet by mouth daily.    . ondansetron (ZOFRAN ODT) 8 MG disintegrating tablet Take 1 tablet (8 mg total) by mouth every 8 (eight) hours as needed for nausea or vomiting. 30 tablet 3  . ondansetron (ZOFRAN) 8 MG tablet Take 1 tablet (8 mg total) by mouth every 8 (eight) hours as needed for nausea or vomiting. 20 tablet 0  . oxyCODONE-acetaminophen (PERCOCET/ROXICET) 5-325 MG tablet Take 1 tablet by mouth every 4 (four) hours as needed for severe pain. 90 tablet 0  . polyethylene glycol (MIRALAX / GLYCOLAX) 17 g packet Take 17 g by mouth 2 (two) times daily.     No current facility-administered medications for this visit.     PHYSICAL EXAMINATION: ECOG PERFORMANCE STATUS: 2 - Symptomatic, <50% confined to bed  Vitals:   07/22/19 1317 07/22/19 1318  BP: (!) 157/74 (!) 143/79  Pulse: 71   Resp: 18   Temp: 97.8 F (36.6 C)   SpO2: 95%    Filed Weights   07/22/19 1317  Weight: 155 lb 3.2 oz (70.4 kg)    GENERAL:alert, no distress and comfortable.  She looks a bit pale SKIN: skin color, texture, turgor are normal, no rashes or significant lesions EYES: normal, Conjunctiva are pink and non-injected, sclera clear OROPHARYNX:no exudate, no erythema and lips, buccal mucosa, and tongue normal  NECK: supple, thyroid normal size, non-tender, without nodularity LYMPH:  no palpable lymphadenopathy in the cervical, axillary or inguinal LUNGS: clear to auscultation and percussion with normal breathing effort HEART: regular rate & rhythm and no murmurs and no lower extremity  edema ABDOMEN:abdomen soft, non-tender and normal bowel sounds Musculoskeletal:no cyanosis of digits and no clubbing  NEURO: alert & oriented x 3 with fluent speech, no focal motor/sensory deficits  LABORATORY DATA:  I have reviewed the data as listed    Component Value Date/Time   NA 137 07/22/2019 1305   NA 139 06/27/2018 1649   K 4.1 07/22/2019 1305   CL 105 07/22/2019 1305   CO2 25 07/22/2019 1305   GLUCOSE 106 (H) 07/22/2019 1305   BUN 16 07/22/2019 1305   BUN 17 06/27/2018 1649   CREATININE 0.85 07/22/2019 1305   CALCIUM 9.0 07/22/2019 1305   PROT 6.5 07/22/2019 1305   PROT 6.8 05/24/2018 0851   ALBUMIN 2.9 (L) 07/22/2019 1305   ALBUMIN 4.2 05/24/2018 0851   AST 28 07/22/2019 1305   ALT 15 07/22/2019 1305  ALKPHOS 80 07/22/2019 1305   BILITOT <0.2 (L) 07/22/2019 1305   BILITOT 0.2 05/24/2018 0851   GFRNONAA >60 07/22/2019 1305   GFRAA >60 07/22/2019 1305    No results found for: SPEP, UPEP  Lab Results  Component Value Date   WBC 4.4 07/22/2019   NEUTROABS 2.8 07/22/2019   HGB 8.1 (L) 07/22/2019   HCT 24.6 (L) 07/22/2019   MCV 112.3 (H) 07/22/2019   PLT 80 (L) 07/22/2019      Chemistry      Component Value Date/Time   NA 137 07/22/2019 1305   NA 139 06/27/2018 1649   K 4.1 07/22/2019 1305   CL 105 07/22/2019 1305   CO2 25 07/22/2019 1305   BUN 16 07/22/2019 1305   BUN 17 06/27/2018 1649   CREATININE 0.85 07/22/2019 1305      Component Value Date/Time   CALCIUM 9.0 07/22/2019 1305   ALKPHOS 80 07/22/2019 1305   AST 28 07/22/2019 1305   ALT 15 07/22/2019 1305   BILITOT <0.2 (L) 07/22/2019 1305   BILITOT 0.2 05/24/2018 0851       RADIOGRAPHIC STUDIES: I have personally reviewed the radiological images as listed and agreed with the findings in the report. Ct Abdomen Pelvis W Contrast  Result Date: 07/05/2019 CLINICAL DATA:  Epithelial ovarian/fallopian tube/primary peritoneal carcinoma. EXAM: CT ABDOMEN AND PELVIS WITH CONTRAST TECHNIQUE:  Multidetector CT imaging of the abdomen and pelvis was performed using the standard protocol following bolus administration of intravenous contrast. CONTRAST:  17m OMNIPAQUE IOHEXOL 300 MG/ML  SOLN COMPARISON:  04/26/2019 FINDINGS: Lower chest: 4 mm left lower lobe lung nodule noted, unchanged. Hepatobiliary: There is no focal liver abnormality identified. Previous cholecystectomy. Common bile duct is upper limits of normal in caliber measuring 6 mm. Pancreas: Unremarkable. No pancreatic ductal dilatation or surrounding inflammatory changes. Spleen: Normal in size without focal abnormality. Adrenals/Urinary Tract: Normal appearance of the adrenal glands. Small, 3 mm right lower pole kidney stone. No kidney mass or hydronephrosis identified at this time. The urinary bladder appears normal. Stomach/Bowel: Stomach is normal. No bowel wall inflammation or distension. Distal colonic diverticulosis identified without acute inflammation. New serosal thickening along the posterior wall of the transverse colon, image 46/2. Vascular/Lymphatic: Aortic atherosclerosis. No aneurysm. Increase in size of retroperitoneal lymph nodes. Left periaortic node the up measures 0.8 cm, image 32/2 new from previous exam. Left retroperitoneal lymph node measures 0.7 cm, image 35/2 previously 0.3 cm. 1 cm left common iliac node noted, image 52/2. Previously 0.6 cm. 1.1 cm left external iliac node identified. Previously 0.9 cm. Reproductive: Status post hysterectomy. No adnexal masses. Other: Peritoneal carcinomatosis is identified. Most notable is in the left lower quadrant of the abdomen surrounding the distal transverse colon. Here, there is an area of soft tissue infiltration measuring 5.1 x 2.2 cm with adjacent serosal involvement of the colon, image 48/2. Anterior to the is a cystic lesion along the undersurface of the ventral abdominal wall which measures 3.3 x 2.1 cm, image 48/2. Peritoneal thickening and nodularity along the left  pericolic gutter is new, image 42/5. Adjacent increased serosal soft tissue is noted along the lateral aspect of the descending colon, image 50/5. Within the left upper quadrant of the abdomen there is an area of peritoneal soft tissue adjacent to the distal transverse colon which measures 3.8 x 2.5 cm, image 38/5. Lymph node or peritoneal nodule is identified in the left lower quadrant of the abdomen measuring 1.0 x 1.6 cm, image 30/5. Previously 0.7  x 1.4 cm. Musculoskeletal: Scoliosis and degenerative disc disease identified within the lumbar spine. IMPRESSION: 1. Imaging findings consistent with recurrent peritoneal carcinomatosis. This is most apparent within the left abdomen involving the transverse colon and descending colon where there is increased soft tissue, loculated fluid and colonic serosal soft tissue thickening. 2. Increase in size of retroperitoneal and pelvic lymph nodes compatible with metastatic adenopathy. 3. No evidence for bowel obstruction. Electronically Signed   By: Kerby Moors M.D.   On: 07/05/2019 11:55   Dg Chest Port 1 View  Result Date: 07/17/2019 CLINICAL DATA:  Hypoxemia, confusion and fatigue. Undergoing chemotherapy for GYN malignancy. EXAM: PORTABLE CHEST 1 VIEW COMPARISON:  Radiographs 12/31/2018.  Abdominal CT 07/05/2019. FINDINGS: 1141 hours. There are lower lung volumes. Right IJ Port-A-Cath tip is unchanged at the lower SVC level. The heart size and mediastinal contours are stable for the lesser degree of inspiration. There is mild bibasilar atelectasis, vascular congestion and possible mild edema. No confluent airspace opacity, significant pleural effusion or pneumothorax. The bones appear unchanged. IMPRESSION: Lower lung volumes with bibasilar atelectasis, vascular congestion and possible mild pulmonary edema. No confluent airspace opacity. Electronically Signed   By: Richardean Sale M.D.   On: 07/17/2019 11:58    All questions were answered. The patient knows to  call the clinic with any problems, questions or concerns. No barriers to learning was detected.  I spent 70 minutes counseling the patient face to face. The total time spent in the appointment was 80 minutes and more than 50% was on counseling and review of test results  Heath Lark, MD 07/22/2019 2:23 PM

## 2019-07-22 NOTE — Assessment & Plan Note (Signed)
She has wide fluctuation of her blood pressure I recommend holding off treatment with hydrochlorothiazide if she is not able to drink enough liquid to prevent risk of dehydration

## 2019-07-29 ENCOUNTER — Other Ambulatory Visit: Payer: Self-pay

## 2019-07-29 ENCOUNTER — Inpatient Hospital Stay: Payer: Medicare Other

## 2019-07-29 ENCOUNTER — Inpatient Hospital Stay (HOSPITAL_BASED_OUTPATIENT_CLINIC_OR_DEPARTMENT_OTHER): Payer: Medicare Other | Admitting: Hematology and Oncology

## 2019-07-29 VITALS — HR 52

## 2019-07-29 VITALS — BP 118/61 | HR 44 | Temp 98.0°F | Resp 18 | Ht 62.0 in | Wt 153.8 lb

## 2019-07-29 DIAGNOSIS — D61818 Other pancytopenia: Secondary | ICD-10-CM

## 2019-07-29 DIAGNOSIS — N183 Chronic kidney disease, stage 3 unspecified: Secondary | ICD-10-CM

## 2019-07-29 DIAGNOSIS — A419 Sepsis, unspecified organism: Secondary | ICD-10-CM | POA: Diagnosis not present

## 2019-07-29 DIAGNOSIS — G893 Neoplasm related pain (acute) (chronic): Secondary | ICD-10-CM | POA: Diagnosis not present

## 2019-07-29 DIAGNOSIS — C5700 Malignant neoplasm of unspecified fallopian tube: Secondary | ICD-10-CM | POA: Diagnosis not present

## 2019-07-29 DIAGNOSIS — E039 Hypothyroidism, unspecified: Secondary | ICD-10-CM

## 2019-07-29 DIAGNOSIS — D539 Nutritional anemia, unspecified: Secondary | ICD-10-CM

## 2019-07-29 DIAGNOSIS — Z7189 Other specified counseling: Secondary | ICD-10-CM

## 2019-07-29 DIAGNOSIS — R509 Fever, unspecified: Secondary | ICD-10-CM | POA: Diagnosis not present

## 2019-07-29 DIAGNOSIS — T451X5A Adverse effect of antineoplastic and immunosuppressive drugs, initial encounter: Secondary | ICD-10-CM

## 2019-07-29 DIAGNOSIS — Z95828 Presence of other vascular implants and grafts: Secondary | ICD-10-CM

## 2019-07-29 DIAGNOSIS — D6481 Anemia due to antineoplastic chemotherapy: Secondary | ICD-10-CM

## 2019-07-29 DIAGNOSIS — I10 Essential (primary) hypertension: Secondary | ICD-10-CM

## 2019-07-29 LAB — TSH: TSH: 3.254 u[IU]/mL (ref 0.308–3.960)

## 2019-07-29 LAB — CBC WITH DIFFERENTIAL/PLATELET
Abs Immature Granulocytes: 0.03 10*3/uL (ref 0.00–0.07)
Basophils Absolute: 0 10*3/uL (ref 0.0–0.1)
Basophils Relative: 0 %
Eosinophils Absolute: 0.1 10*3/uL (ref 0.0–0.5)
Eosinophils Relative: 1 %
HCT: 24.5 % — ABNORMAL LOW (ref 36.0–46.0)
Hemoglobin: 8.1 g/dL — ABNORMAL LOW (ref 12.0–15.0)
Immature Granulocytes: 1 %
Lymphocytes Relative: 25 %
Lymphs Abs: 1.2 10*3/uL (ref 0.7–4.0)
MCH: 37.3 pg — ABNORMAL HIGH (ref 26.0–34.0)
MCHC: 33.1 g/dL (ref 30.0–36.0)
MCV: 112.9 fL — ABNORMAL HIGH (ref 80.0–100.0)
Monocytes Absolute: 0.7 10*3/uL (ref 0.1–1.0)
Monocytes Relative: 13 %
Neutro Abs: 2.9 10*3/uL (ref 1.7–7.7)
Neutrophils Relative %: 60 %
Platelets: 100 10*3/uL — ABNORMAL LOW (ref 150–400)
RBC: 2.17 MIL/uL — ABNORMAL LOW (ref 3.87–5.11)
RDW: 13.8 % (ref 11.5–15.5)
WBC: 4.9 10*3/uL (ref 4.0–10.5)
nRBC: 0 % (ref 0.0–0.2)

## 2019-07-29 LAB — COMPREHENSIVE METABOLIC PANEL
ALT: 18 U/L (ref 0–44)
AST: 19 U/L (ref 15–41)
Albumin: 3.3 g/dL — ABNORMAL LOW (ref 3.5–5.0)
Alkaline Phosphatase: 90 U/L (ref 38–126)
Anion gap: 9 (ref 5–15)
BUN: 31 mg/dL — ABNORMAL HIGH (ref 8–23)
CO2: 25 mmol/L (ref 22–32)
Calcium: 9 mg/dL (ref 8.9–10.3)
Chloride: 103 mmol/L (ref 98–111)
Creatinine, Ser: 1.46 mg/dL — ABNORMAL HIGH (ref 0.44–1.00)
GFR calc Af Amer: 38 mL/min — ABNORMAL LOW (ref >60)
GFR calc non Af Amer: 33 mL/min — ABNORMAL LOW (ref >60)
Glucose, Bld: 106 mg/dL — ABNORMAL HIGH (ref 70–99)
Potassium: 4.1 mmol/L (ref 3.5–5.1)
Sodium: 137 mmol/L (ref 135–145)
Total Bilirubin: 0.3 mg/dL (ref 0.3–1.2)
Total Protein: 6.8 g/dL (ref 6.5–8.1)

## 2019-07-29 MED ORDER — PROCHLORPERAZINE MALEATE 10 MG PO TABS
ORAL_TABLET | ORAL | Status: AC
  Filled 2019-07-29: qty 1

## 2019-07-29 MED ORDER — SODIUM CHLORIDE 0.9 % IV SOLN
Freq: Once | INTRAVENOUS | Status: AC
  Administered 2019-07-29: 11:00:00 via INTRAVENOUS
  Filled 2019-07-29: qty 250

## 2019-07-29 MED ORDER — OXYCODONE-ACETAMINOPHEN 5-325 MG PO TABS: 1.0000 | ORAL_TABLET | ORAL | 0 refills | Status: DC | PRN

## 2019-07-29 MED ORDER — SODIUM CHLORIDE 0.9 % IV SOLN
600.0000 mg/m2 | Freq: Once | INTRAVENOUS | Status: AC
  Administered 2019-07-29: 1026 mg via INTRAVENOUS
  Filled 2019-07-29: qty 26.98

## 2019-07-29 MED ORDER — MORPHINE SULFATE ER 30 MG PO TBCR: 30.0000 mg | EXTENDED_RELEASE_TABLET | Freq: Two times a day (BID) | ORAL | 0 refills | Status: DC

## 2019-07-29 MED ORDER — HEPARIN SOD (PORK) LOCK FLUSH 100 UNIT/ML IV SOLN
500.0000 [IU] | Freq: Once | INTRAVENOUS | Status: AC | PRN
  Administered 2019-07-29: 500 [IU]
  Filled 2019-07-29: qty 5

## 2019-07-29 MED ORDER — SODIUM CHLORIDE 0.9% FLUSH
10.0000 mL | INTRAVENOUS | Status: DC | PRN
  Administered 2019-07-29: 10 mL
  Filled 2019-07-29: qty 10

## 2019-07-29 MED ORDER — PROCHLORPERAZINE MALEATE 10 MG PO TABS
10.0000 mg | ORAL_TABLET | Freq: Once | ORAL | Status: AC
  Administered 2019-07-29: 10 mg via ORAL

## 2019-07-29 MED ORDER — SODIUM CHLORIDE 0.9% FLUSH
10.0000 mL | Freq: Once | INTRAVENOUS | Status: AC
  Administered 2019-07-29: 10 mL
  Filled 2019-07-29: qty 10

## 2019-07-29 MED ORDER — HYDROCHLOROTHIAZIDE 25 MG PO TABS: 25.0000 mg | ORAL_TABLET | Freq: Every day | ORAL | 9 refills | Status: DC

## 2019-07-29 NOTE — Patient Instructions (Signed)
[facility] °Discharge Instructions for Patients Receiving Chemotherapy ° °Today you received the following chemotherapy agents Gemzar ° °To help prevent nausea and vomiting after your treatment, we encourage you to take your nausea medication as directed. °  °If you develop nausea and vomiting that is not controlled by your nausea medication, call the clinic.  ° °BELOW ARE SYMPTOMS THAT SHOULD BE REPORTED IMMEDIATELY: °· *FEVER GREATER THAN 100.5 F °· *CHILLS WITH OR WITHOUT FEVER °· NAUSEA AND VOMITING THAT IS NOT CONTROLLED WITH YOUR NAUSEA MEDICATION °· *UNUSUAL SHORTNESS OF BREATH °· *UNUSUAL BRUISING OR BLEEDING °· TENDERNESS IN MOUTH AND THROAT WITH OR WITHOUT PRESENCE OF ULCERS °· *URINARY PROBLEMS °· *BOWEL PROBLEMS °· UNUSUAL RASH °Items with * indicate a potential emergency and should be followed up as soon as possible. ° °Feel free to call the clinic should you have any questions or concerns. The clinic phone number is [phone]. ° °Please show the CHEMO ALERT CARD at check-in to the Emergency Department and triage nurse. ° ° °

## 2019-07-30 ENCOUNTER — Telehealth: Payer: Self-pay | Admitting: Hematology and Oncology

## 2019-07-30 ENCOUNTER — Encounter: Payer: Self-pay | Admitting: Hematology and Oncology

## 2019-07-30 NOTE — Assessment & Plan Note (Signed)
Her blood pressure control at home is reasonable We will continue current antihypertensives

## 2019-07-30 NOTE — Progress Notes (Signed)
[facility] OFFICE PROGRESS NOTE  Patient Care Team: Patient, No Pcp Per as PCP - General ([place]) Ileana Roup, MD as Consulting Physician (General Surgery)  ASSESSMENT & PLAN:  Fallopian tube cancer, carcinoma ([place]) Overall, she is recovering well from recent side effects of treatment Her pain is better control Her pancytopenia has improved We will resume chemotherapy I plan to modify her chemotherapy schedule to every other week and to keep the dose around 600 mg per metered square  Cancer associated pain Her pain control is good She will continue MS Contin twice a day along with oxycodone/acetaminophen as needed  Pancytopenia, acquired ([place]) Pancytopenia has improved I plan to reduce the dose of chemotherapy as above She does not need transfusion support  CKD (chronic kidney disease), stage III ([place]) She has intermittent elevated serum creatinine We will monitor carefully  Essential hypertension Her blood pressure control at home is reasonable We will continue current antihypertensives   Orders Placed This Encounter  Procedures  . Sample to Blood Bank    Standing Status:   Standing    Number of Occurrences:   33    Standing Expiration Date:   07/28/2020    INTERVAL HISTORY: Please see below for problem oriented charting. Her daughter is with her to accompany her current visit due to her poor hearing and some difficulties of understanding plan of care in the past  She returns to be seen prior to cycle 2 of treatment Since last time I saw her, she have no further recurrence of fever Her pain control has improved with scheduled MS Contin twice a day and as needed breakthrough pain medicine once or twice a day She has no nausea or constipation Her energy level has improved somewhat The patient denies any recent signs or symptoms of bleeding such as spontaneous epistaxis, hematuria or hematochezia. Her blood pressure control at home was  reasonable Her oral intake is fair   SUMMARY OF ONCOLOGIC HISTORY: Oncology History Overview Note  Neg genetics.  HRD test was done on peritoneal fluid from 07/18/2018: HRD +   Fallopian tube cancer, carcinoma ([place])  06/18/2016 Tumor Marker   Patient's tumor was tested for the following markers: CA-125 Results of the tumor marker test revealed 298.   06/23/2016 PET scan   Diffuse hypermetabolic peritoneal carcinomatosis in the abdomen and pelvis, internal mammary adenopathy in the chest, hypermetabolic right paracardiac/diaphragmatic LN, bilateral pleural effusion and moderate ascites   06/24/2016 Procedure   Therapeutic paracentesis   06/24/2016 Pathology Results   Positive for adenocarcinoma, Mullerian primary   06/28/2016 Procedure   She underwent CT guided biopsy of omentum   06/28/2016 Pathology Results   High grade serous involving the omentum, positive for p53, PAX8, WT1 and p16.   07/01/2016 Procedure   Findings/impression:   1. Sonographic evaluation of the right internal jugular vein confirms patency. Sonography was required to gain central venous access. An image documenting patency and needle access was saved.   2. Successful placement of right internal jugular vein subcutaneous Mediport as described. Spot film shows the catheter tip at the cavoatrial junction. No pneumothorax. The Mediport aspirates and flushes normally.   07/02/2016 - 08/12/2016 Chemotherapy   The patient had 3 cycles of carboplatin and taxol chemotherapy treatment.     08/29/2016 Imaging   Ct scan of chest, abdomen and pelvis showed marked improvement and resolution of thoracic adenopathy   09/08/2016 Surgery   She underwent interval debulking surgery with TAH/BSO and infracolic  omentectomy by Dr. Wynelle Cleveland   09/08/2016 Pathology Results   Right tube: scant serous carcinoma. No viable tumor. Left tube and ovary: normal. Omentum: low volume serous carcinoma with few psamomma bodies.   10/07/2016 -  11/18/2016 Chemotherapy   The patient had 3 more cycles of carboplatin and taxol for chemotherapy treatment.     02/07/2017 Imaging   Impression:  1. Colonic diverticulosis without acute diverticulitis. 2. Stable calcification right kidney without hydronephrosis. 3. No acute process or significant interval change has occurred since August 29, 2016.   05/09/2018 Imaging   Impression:  Findings are most suspicious for acute sigmoid colonic diverticulitis with intraloop abscess formation.   05/14/2018 Imaging   Impression: The pelvic fluid collection contiguous with sigmoid colonic diverticulitis changes has not significant change since May 09, 2018. No new fluid collections are seen   06/28/2018 Imaging   IMPRESSION: Changes consistent with diverticulitis with free fluid within the pelvis. No focal contained abscess is seen. A previously noted fluid collection deep within the pelvis has resolved in the interval.   Stable nonobstructing right renal stone.  The remainder of the exam is stable from the prior study.    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 There is omental caking as well as stranding within various areas of the peritoneal fat. There is also wall thickening of the sigmoid colon with adjacent stranding. This finding can also be related to peritoneal carcinomatosis and implants although an inflammatory process of the sigmoid colon is not excluded.   Right nephrolithiasis.  Ileus pattern.   07/16/2018 Imaging   Impression:   Findings of peritoneal carcinomatosis, rapidly progressive. Not present in July 2019, progressed when compared to September 13.  Multiple bowel loops are distorted but no distention or transition zone to suggest a component of active obstruction.   07/18/2018 Procedure    Findings/impression:  1. Sonographic evaluation of ascites. Sonography was required to gain access to ascites. An image documenting ascites was saved. 2. Successful ultrasound guided paracentesis using a temporary peritoneal drainage catheter inserted in the right upper quadrant as described yielding 600cc of ascites.    07/20/2018 Echocardiogram   General:  The patient was in normal sinus rhythm.  Left Ventricle:  Left ventricular ejection fraction was normal, estimated in the range of 60 to 65%. The left ventricular cavity size appears normal. The left ventricular wall thickness appears normal. The left ventricle is thickened in a fashion  consistent with mild concentric hypertrophy. Doppler tissue velocities and mitral inflow profile are consistent with Stage I diastolic dysfunction. The calculated ejection fraction is 68.1 %.  Right Ventricle:  The right ventricle appears normal in size and function.  Left Atrium:  The left atrium appears normal.  Right Atrium:  The right atrium appears normal.  Aortic Valve:  The structure of the aortic valve is tricuspid. There is evidence of trivial (trace) aortic regurgitation.  Mitral Valve:  There is no evidence of mitral regurgitation. The mitral valve appears normal in structure.  Pulmonic Valve:  There is evidence of trace (trivial) pulmonic regurgitation. The pulmonic valve appears normal in structure.  Tricuspid Valve:  There is evidence of trace (trivial) tricuspid regurgitation. The tricuspid valve appears normal in structure.  Pericardium:  There is no evidence of pericardial effusion.  Aorta:  The visualized portions of the aorta (ascending aorta, aortic root, and  aortic arch) appear normal.  Pulmonic Artery:  Unable to obtain RVSP due to insufficient tricuspid regurgitation jet.  Conclusions: Left ventricular ejection fraction was normal, estimated in the range of 60 to 65%. The left ventricular cavity size appears normal. The left  ventricular wall thickness appears normal. The left ventricle is thickened in a fashion consistent with mild concentric hypertrophy. Doppler tissue velocities and mitral inflow profile are consistent with Stage I diastolic dysfunction. The calculated ejection fraction is 68.1%.There is evidence of trivial (trace) aortic regurgitation.There is evidence of trace (trivial) pulmonic regurgitation.There is evidence of trace (trivial) tricuspid regurgitation.There is no evidence of pericardial effusion.Unable to obtain RVSP due to insufficient tricuspid regurgitation jet.   07/23/2018 - 12/25/2018 Chemotherapy   The patient had carboplatin, doxil and Avastin for chemotherapy treatment x 6 cycles   08/01/2018 Imaging   Ct head showed no injuries.   08/01/2018 Procedure   Findings/impression:  1. Sonographic evaluation of ascites. Sonography was required to gain access to ascites. An image documenting ascites was saved. 2. Successful ultrasound guided paracentesis using a temporary peritoneal drainage catheter inserted in the right upper quadrant as described yielding 400cc of ascites.    08/06/2018 Tumor Marker   Patient's tumor was tested for the following markers: CA-125 Results of the tumor marker test revealed 373   08/20/2018 Tumor Marker   Patient's tumor was tested for the following markers: CA-125 Results of the tumor marker test revealed 51.   10/03/2018 Genetic Testing   Patient has genetic testing done for genetics testing using her saliva. 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  Liver and spleen homogeneously enhance. No lesion seen.  The adjacent gallbladder is surgically absent.  The adrenal glands, kidneys, pancreas are normal.  Pelvic contents are remarkable for an absent uterus.  The mesenteric stranding has nearly completely resolved. Minimal reticular prominence  throughout the pelvis.  No residual ascites.  No bowel wall thickening or. No dilated loops or transition zone.  The appendix is not seen. But no inflammatory changes in the right lower quadrant.  Osseous structures are intact.   11/27/2018 Imaging   Ct scan abdomen and pelvis showed colonic diverticulosis and nonspecific fluid levels in the bowel   12/24/2018 Cancer Staging   Staging form: Ovary, Fallopian Tube, and Primary Peritoneal Carcinoma, AJCC 8th Edition - Pathologic: Stage IVB (rpT3a, pN0, pM1b) - Signed by Heath Lark, MD on 12/24/2018   12/31/2018 Tumor Marker   Patient's tumor was tested for the following markers: CA-125 Results of the tumor marker test revealed 14.4   01/01/2019 Imaging   1. LEFT jugular Port-A-Cath catheter tip projects over the mid SVC. 2. No acute cardiopulmonary disease.    01/04/2019 Echocardiogram   1. The left ventricle has normal systolic function with an ejection fraction of 60-65%. The cavity size was normal. Left ventricular diastolic Doppler parameters are consistent with impaired relaxation.  2. The right ventricle has normal systolic function. The cavity was normal. There is no increase in right ventricular wall thickness.  3. The mitral valve is normal in structure.  4. The tricuspid valve is normal in structure.  5. The aortic valve is normal in structure.  6. The pulmonic valve was normal in structure.  7. 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    02/13/2019 -  Chemotherapy   The patient is started on Lynparza due  to HRD    04/22/2019 Tumor Marker   Patient's tumor was tested for the following markers: CA-125 Results of the tumor marker test revealed 44.8   04/26/2019 Imaging   1. No evidence of recurrent or metastatic carcinoma within the abdomen or pelvis. 2. Colonic diverticulosis, without radiographic evidence of diverticulitis or other acute findings.   Aortic Atherosclerosis (ICD10-I70.0).   05/28/2019  Tumor Marker   Patient's tumor was tested for the following markers: CA-125 Results of the tumor marker test revealed 104   07/05/2019 Imaging   1. Imaging findings consistent with recurrent peritoneal carcinomatosis. This is most apparent within the left abdomen involving the transverse colon and descending colon where there is increased soft tissue, loculated fluid and colonic serosal soft tissue thickening. 2. Increase in size of retroperitoneal and pelvic lymph nodes compatible with metastatic adenopathy. 3. No evidence for bowel obstruction.     07/05/2019 Tumor Marker   Patient's tumor was tested for the following markers: CA-125 Results of the tumor marker test revealed 233   07/15/2019 -  Chemotherapy   The patient had gemcitabine (GEMZAR) 1,368 mg in sodium chloride 0.9 % 250 mL chemo infusion, 800 mg/m2 = 1,368 mg (80 % of original dose 1,000 mg/m2), Intravenous,  Once, 1 of 4 cycles Dose modification: 800 mg/m2 (80 % of original dose 1,000 mg/m2, Cycle 1, Reason: Patient Age), 600 mg/m2 (60 % of original dose 1,000 mg/m2, Cycle 1, Reason: Dose Not Tolerated) Administration: 1,368 mg (07/15/2019), 1,026 mg (07/29/2019)  for chemotherapy treatment.      REVIEW OF SYSTEMS:   Eyes: Denies blurriness of vision Ears, nose, mouth, throat, and face: Denies mucositis or sore throat Respiratory: Denies cough, dyspnea or wheezes Cardiovascular: Denies palpitation, chest discomfort or lower extremity swelling Skin: Denies abnormal skin rashes Lymphatics: Denies new lymphadenopathy or easy bruising Neurological:Denies numbness, tingling or new weaknesses Behavioral/Psych: Mood is stable, no new changes  All other systems were reviewed with the patient and are negative.  I have reviewed the past medical history, past surgical history, social history and family history with the patient and they are unchanged from previous note.  ALLERGIES:  is allergic to latex.  MEDICATIONS:  Current  Outpatient Medications  Medication Sig Dispense Refill  . Acetaminophen 325 MG CAPS Take 650 mg by mouth every 6 (six) hours as needed.    Marland Kitchen amLODipine (NORVASC) 10 MG tablet Take 1 tablet (10 mg total) by mouth daily. (Patient taking differently: Take 10 mg by mouth daily. 5 mg daily) 90 tablet 9  . atenolol (TENORMIN) 25 MG tablet Take 0.5 tablets (12.5 mg total) by mouth daily. (Patient taking differently: Take 25 mg by mouth daily. ) 30 tablet 11  . Cholecalciferol (VITAMIN D PO) Take 1 tablet by mouth daily. D3 1000units    . diphenhydramine-acetaminophen (TYLENOL PM) 25-500 MG TABS tablet Take 1 tablet by mouth at bedtime as needed.    . hydrochlorothiazide (HYDRODIURIL) 25 MG tablet Take 1 tablet (25 mg total) by mouth daily. 30 tablet 9  . levothyroxine (SYNTHROID) 50 MCG tablet Take 0.5 tablets (25 mcg total) by mouth daily before breakfast. (Patient taking differently: Take 50 mcg by mouth daily before breakfast. 25 MCG) 30 tablet 3  . losartan (COZAAR) 100 MG tablet Take 1 tablet (100 mg total) by mouth daily. 30 tablet 0  . morphine (MS CONTIN) 30 MG 12 hr tablet Take 1 tablet (30 mg total) by mouth every 12 (twelve) hours. 60 tablet 0  . Multiple  Vitamin (MULTIVITAMIN) tablet Take 1 tablet by mouth daily.    . ondansetron (ZOFRAN ODT) 8 MG disintegrating tablet Take 1 tablet (8 mg total) by mouth every 8 (eight) hours as needed for nausea or vomiting. 30 tablet 3  . ondansetron (ZOFRAN) 8 MG tablet Take 1 tablet (8 mg total) by mouth every 8 (eight) hours as needed for nausea or vomiting. 20 tablet 0  . oxyCODONE-acetaminophen (PERCOCET/ROXICET) 5-325 MG tablet Take 1 tablet by mouth every 4 (four) hours as needed for severe pain. 90 tablet 0  . polyethylene glycol (MIRALAX / GLYCOLAX) 17 g packet Take 17 g by mouth 2 (two) times daily.     No current facility-administered medications for this visit.     PHYSICAL EXAMINATION: ECOG PERFORMANCE STATUS: 1 - Symptomatic but completely  ambulatory  Vitals:   07/29/19 1025  BP: 118/61  Pulse: (!) 44  Resp: 18  Temp: 98 F (36.7 C)  SpO2: 95%   Filed Weights   07/29/19 1025  Weight: 153 lb 12.8 oz (69.8 kg)    GENERAL:alert, no distress and comfortable.  She looks a bit pale SKIN: skin color, texture, turgor are normal, no rashes or significant lesions EYES: normal, Conjunctiva are pink and non-injected, sclera clear OROPHARYNX:no exudate, no erythema and lips, buccal mucosa, and tongue normal  NECK: supple, thyroid normal size, non-tender, without nodularity LYMPH:  no palpable lymphadenopathy in the cervical, axillary or inguinal LUNGS: clear to auscultation and percussion with normal breathing effort HEART: regular rate & rhythm and no murmurs and no lower extremity edema ABDOMEN:abdomen soft, non-tender and normal bowel sounds Musculoskeletal:no cyanosis of digits and no clubbing  NEURO: alert & oriented x 3 with fluent speech, no focal motor/sensory deficits  LABORATORY DATA:  I have reviewed the data as listed    Component Value Date/Time   NA 137 07/29/2019 1011   NA 139 06/27/2018 1649   K 4.1 07/29/2019 1011   CL 103 07/29/2019 1011   CO2 25 07/29/2019 1011   GLUCOSE 106 (H) 07/29/2019 1011   BUN 31 (H) 07/29/2019 1011   BUN 17 06/27/2018 1649   CREATININE 1.46 (H) 07/29/2019 1011   CALCIUM 9.0 07/29/2019 1011   PROT 6.8 07/29/2019 1011   PROT 6.8 05/24/2018 0851   ALBUMIN 3.3 (L) 07/29/2019 1011   ALBUMIN 4.2 05/24/2018 0851   AST 19 07/29/2019 1011   ALT 18 07/29/2019 1011   ALKPHOS 90 07/29/2019 1011   BILITOT 0.3 07/29/2019 1011   BILITOT 0.2 05/24/2018 0851   GFRNONAA 33 (L) 07/29/2019 1011   GFRAA 38 (L) 07/29/2019 1011    No results found for: SPEP, UPEP  Lab Results  Component Value Date   WBC 4.9 07/29/2019   NEUTROABS 2.9 07/29/2019   HGB 8.1 (L) 07/29/2019   HCT 24.5 (L) 07/29/2019   MCV 112.9 (H) 07/29/2019   PLT 100 (L) 07/29/2019      Chemistry      Component  Value Date/Time   NA 137 07/29/2019 1011   NA 139 06/27/2018 1649   K 4.1 07/29/2019 1011   CL 103 07/29/2019 1011   CO2 25 07/29/2019 1011   BUN 31 (H) 07/29/2019 1011   BUN 17 06/27/2018 1649   CREATININE 1.46 (H) 07/29/2019 1011      Component Value Date/Time   CALCIUM 9.0 07/29/2019 1011   ALKPHOS 90 07/29/2019 1011   AST 19 07/29/2019 1011   ALT 18 07/29/2019 1011   BILITOT 0.3 07/29/2019 1011  BILITOT 0.2 05/24/2018 0851       RADIOGRAPHIC STUDIES: I have personally reviewed the radiological images as listed and agreed with the findings in the report. Ct Abdomen Pelvis W Contrast  Result Date: 07/05/2019 CLINICAL DATA:  Epithelial ovarian/fallopian tube/primary peritoneal carcinoma. EXAM: CT ABDOMEN AND PELVIS WITH CONTRAST TECHNIQUE: Multidetector CT imaging of the abdomen and pelvis was performed using the standard protocol following bolus administration of intravenous contrast. CONTRAST:  141m OMNIPAQUE IOHEXOL 300 MG/ML  SOLN COMPARISON:  04/26/2019 FINDINGS: Lower chest: 4 mm left lower lobe lung nodule noted, unchanged. Hepatobiliary: There is no focal liver abnormality identified. Previous cholecystectomy. Common bile duct is upper limits of normal in caliber measuring 6 mm. Pancreas: Unremarkable. No pancreatic ductal dilatation or surrounding inflammatory changes. Spleen: Normal in size without focal abnormality. Adrenals/Urinary Tract: Normal appearance of the adrenal glands. Small, 3 mm right lower pole kidney stone. No kidney mass or hydronephrosis identified at this time. The urinary bladder appears normal. Stomach/Bowel: Stomach is normal. No bowel wall inflammation or distension. Distal colonic diverticulosis identified without acute inflammation. New serosal thickening along the posterior wall of the transverse colon, image 46/2. Vascular/Lymphatic: Aortic atherosclerosis. No aneurysm. Increase in size of retroperitoneal lymph nodes. Left periaortic node the up  measures 0.8 cm, image 32/2 new from previous exam. Left retroperitoneal lymph node measures 0.7 cm, image 35/2 previously 0.3 cm. 1 cm left common iliac node noted, image 52/2. Previously 0.6 cm. 1.1 cm left external iliac node identified. Previously 0.9 cm. Reproductive: Status post hysterectomy. No adnexal masses. Other: Peritoneal carcinomatosis is identified. Most notable is in the left lower quadrant of the abdomen surrounding the distal transverse colon. Here, there is an area of soft tissue infiltration measuring 5.1 x 2.2 cm with adjacent serosal involvement of the colon, image 48/2. Anterior to the is a cystic lesion along the undersurface of the ventral abdominal wall which measures 3.3 x 2.1 cm, image 48/2. Peritoneal thickening and nodularity along the left pericolic gutter is new, image 42/5. Adjacent increased serosal soft tissue is noted along the lateral aspect of the descending colon, image 50/5. Within the left upper quadrant of the abdomen there is an area of peritoneal soft tissue adjacent to the distal transverse colon which measures 3.8 x 2.5 cm, image 38/5. Lymph node or peritoneal nodule is identified in the left lower quadrant of the abdomen measuring 1.0 x 1.6 cm, image 30/5. Previously 0.7 x 1.4 cm. Musculoskeletal: Scoliosis and degenerative disc disease identified within the lumbar spine. IMPRESSION: 1. Imaging findings consistent with recurrent peritoneal carcinomatosis. This is most apparent within the left abdomen involving the transverse colon and descending colon where there is increased soft tissue, loculated fluid and colonic serosal soft tissue thickening. 2. Increase in size of retroperitoneal and pelvic lymph nodes compatible with metastatic adenopathy. 3. No evidence for bowel obstruction. Electronically Signed   By: TKerby MoorsM.D.   On: 07/05/2019 11:55   Dg Chest Port 1 View  Result Date: 07/17/2019 CLINICAL DATA:  Hypoxemia, confusion and fatigue. Undergoing  chemotherapy for GYN malignancy. EXAM: PORTABLE CHEST 1 VIEW COMPARISON:  Radiographs 12/31/2018.  Abdominal CT 07/05/2019. FINDINGS: 1141 hours. There are lower lung volumes. Right IJ Port-A-Cath tip is unchanged at the lower SVC level. The heart size and mediastinal contours are stable for the lesser degree of inspiration. There is mild bibasilar atelectasis, vascular congestion and possible mild edema. No confluent airspace opacity, significant pleural effusion or pneumothorax. The bones appear unchanged. IMPRESSION: Lower lung  volumes with bibasilar atelectasis, vascular congestion and possible mild pulmonary edema. No confluent airspace opacity. Electronically Signed   By: Richardean Sale M.D.   On: 07/17/2019 11:58    All questions were answered. The patient knows to call the clinic with any problems, questions or concerns. No barriers to learning was detected.  I spent 25 minutes counseling the patient face to face. The total time spent in the appointment was 30 minutes and more than 50% was on counseling and review of test results  Heath Lark, MD 07/30/2019 8:03 AM

## 2019-07-30 NOTE — Telephone Encounter (Signed)
I talk with patient daughter regarding schedule

## 2019-07-30 NOTE — Assessment & Plan Note (Signed)
She has intermittent elevated serum creatinine We will monitor carefully 

## 2019-07-30 NOTE — Assessment & Plan Note (Signed)
Her pain control is good She will continue MS Contin twice a day along with oxycodone/acetaminophen as needed

## 2019-07-30 NOTE — Assessment & Plan Note (Signed)
Pancytopenia has improved I plan to reduce the dose of chemotherapy as above She does not need transfusion support

## 2019-07-30 NOTE — Assessment & Plan Note (Signed)
Overall, she is recovering well from recent side effects of treatment Her pain is better control Her pancytopenia has improved We will resume chemotherapy I plan to modify her chemotherapy schedule to every other week and to keep the dose around 600 mg per metered square

## 2019-08-01 ENCOUNTER — Other Ambulatory Visit: Payer: Self-pay

## 2019-08-01 ENCOUNTER — Telehealth: Payer: Self-pay | Admitting: *Deleted

## 2019-08-01 ENCOUNTER — Encounter (HOSPITAL_COMMUNITY): Payer: Self-pay | Admitting: Emergency Medicine

## 2019-08-01 ENCOUNTER — Inpatient Hospital Stay (HOSPITAL_COMMUNITY)
Admission: EM | Admit: 2019-08-01 | Discharge: 2019-08-03 | DRG: 871 | Disposition: A | Discharge status: Home / Self Care | Payer: Medicare Other | Attending: Student | Admitting: Student

## 2019-08-01 ENCOUNTER — Encounter: Payer: Self-pay | Admitting: Hematology and Oncology

## 2019-08-01 ENCOUNTER — Emergency Department (HOSPITAL_COMMUNITY): Payer: Medicare Other

## 2019-08-01 DIAGNOSIS — C5700 Malignant neoplasm of unspecified fallopian tube: Secondary | ICD-10-CM | POA: Diagnosis present

## 2019-08-01 DIAGNOSIS — T502X5A Adverse effect of carbonic-anhydrase inhibitors, benzothiadiazides and other diuretics, initial encounter: Secondary | ICD-10-CM | POA: Diagnosis present

## 2019-08-01 DIAGNOSIS — I959 Hypotension, unspecified: Secondary | ICD-10-CM | POA: Diagnosis not present

## 2019-08-01 DIAGNOSIS — K760 Fatty (change of) liver, not elsewhere classified: Secondary | ICD-10-CM | POA: Diagnosis present

## 2019-08-01 DIAGNOSIS — A419 Sepsis, unspecified organism: Principal | ICD-10-CM | POA: Diagnosis present

## 2019-08-01 DIAGNOSIS — G9341 Metabolic encephalopathy: Secondary | ICD-10-CM | POA: Diagnosis present

## 2019-08-01 DIAGNOSIS — E871 Hypo-osmolality and hyponatremia: Secondary | ICD-10-CM | POA: Diagnosis present

## 2019-08-01 DIAGNOSIS — Z79891 Long term (current) use of opiate analgesic: Secondary | ICD-10-CM

## 2019-08-01 DIAGNOSIS — K56609 Unspecified intestinal obstruction, unspecified as to partial versus complete obstruction: Secondary | ICD-10-CM | POA: Diagnosis not present

## 2019-08-01 DIAGNOSIS — D61818 Other pancytopenia: Secondary | ICD-10-CM | POA: Diagnosis present

## 2019-08-01 DIAGNOSIS — Z23 Encounter for immunization: Secondary | ICD-10-CM | POA: Diagnosis present

## 2019-08-01 DIAGNOSIS — G893 Neoplasm related pain (acute) (chronic): Secondary | ICD-10-CM | POA: Diagnosis present

## 2019-08-01 DIAGNOSIS — K5732 Diverticulitis of large intestine without perforation or abscess without bleeding: Secondary | ICD-10-CM | POA: Diagnosis present

## 2019-08-01 DIAGNOSIS — Z79890 Hormone replacement therapy (postmenopausal): Secondary | ICD-10-CM

## 2019-08-01 DIAGNOSIS — I10 Essential (primary) hypertension: Secondary | ICD-10-CM | POA: Diagnosis present

## 2019-08-01 DIAGNOSIS — Z8249 Family history of ischemic heart disease and other diseases of the circulatory system: Secondary | ICD-10-CM

## 2019-08-01 DIAGNOSIS — Z91040 Latex allergy status: Secondary | ICD-10-CM

## 2019-08-01 DIAGNOSIS — N183 Chronic kidney disease, stage 3 unspecified: Secondary | ICD-10-CM | POA: Diagnosis present

## 2019-08-01 DIAGNOSIS — E039 Hypothyroidism, unspecified: Secondary | ICD-10-CM | POA: Diagnosis present

## 2019-08-01 DIAGNOSIS — Z20828 Contact with and (suspected) exposure to other viral communicable diseases: Secondary | ICD-10-CM | POA: Diagnosis present

## 2019-08-01 DIAGNOSIS — Z9049 Acquired absence of other specified parts of digestive tract: Secondary | ICD-10-CM

## 2019-08-01 DIAGNOSIS — E785 Hyperlipidemia, unspecified: Secondary | ICD-10-CM | POA: Diagnosis present

## 2019-08-01 DIAGNOSIS — L03116 Cellulitis of left lower limb: Secondary | ICD-10-CM | POA: Diagnosis present

## 2019-08-01 DIAGNOSIS — D6959 Other secondary thrombocytopenia: Secondary | ICD-10-CM | POA: Diagnosis present

## 2019-08-01 DIAGNOSIS — L0390 Cellulitis, unspecified: Secondary | ICD-10-CM | POA: Diagnosis not present

## 2019-08-01 DIAGNOSIS — N1831 Chronic kidney disease, stage 3a: Secondary | ICD-10-CM | POA: Diagnosis not present

## 2019-08-01 DIAGNOSIS — C786 Secondary malignant neoplasm of retroperitoneum and peritoneum: Secondary | ICD-10-CM | POA: Diagnosis present

## 2019-08-01 DIAGNOSIS — Z800 Family history of malignant neoplasm of digestive organs: Secondary | ICD-10-CM

## 2019-08-01 DIAGNOSIS — D6481 Anemia due to antineoplastic chemotherapy: Secondary | ICD-10-CM | POA: Diagnosis present

## 2019-08-01 DIAGNOSIS — M5136 Other intervertebral disc degeneration, lumbar region: Secondary | ICD-10-CM | POA: Diagnosis present

## 2019-08-01 DIAGNOSIS — Z79899 Other long term (current) drug therapy: Secondary | ICD-10-CM

## 2019-08-01 DIAGNOSIS — Z66 Do not resuscitate: Secondary | ICD-10-CM | POA: Diagnosis present

## 2019-08-01 DIAGNOSIS — T451X5A Adverse effect of antineoplastic and immunosuppressive drugs, initial encounter: Secondary | ICD-10-CM | POA: Diagnosis present

## 2019-08-01 DIAGNOSIS — I129 Hypertensive chronic kidney disease with stage 1 through stage 4 chronic kidney disease, or unspecified chronic kidney disease: Secondary | ICD-10-CM | POA: Diagnosis present

## 2019-08-01 DIAGNOSIS — R509 Fever, unspecified: Secondary | ICD-10-CM | POA: Diagnosis present

## 2019-08-01 DIAGNOSIS — Z90710 Acquired absence of both cervix and uterus: Secondary | ICD-10-CM

## 2019-08-01 DIAGNOSIS — Z87442 Personal history of urinary calculi: Secondary | ICD-10-CM

## 2019-08-01 LAB — COMPREHENSIVE METABOLIC PANEL
ALT: 26 U/L (ref 0–44)
AST: 31 U/L (ref 15–41)
Albumin: 2.8 g/dL — ABNORMAL LOW (ref 3.5–5.0)
Alkaline Phosphatase: 79 U/L (ref 38–126)
Anion gap: 10 (ref 5–15)
BUN: 28 mg/dL — ABNORMAL HIGH (ref 8–23)
CO2: 19 mmol/L — ABNORMAL LOW (ref 22–32)
Calcium: 8.2 mg/dL — ABNORMAL LOW (ref 8.9–10.3)
Chloride: 100 mmol/L (ref 98–111)
Creatinine, Ser: 1.18 mg/dL — ABNORMAL HIGH (ref 0.44–1.00)
GFR calc Af Amer: 50 mL/min — ABNORMAL LOW (ref >60)
GFR calc non Af Amer: 43 mL/min — ABNORMAL LOW (ref >60)
Glucose, Bld: 125 mg/dL — ABNORMAL HIGH (ref 70–99)
Potassium: 4.3 mmol/L (ref 3.5–5.1)
Sodium: 129 mmol/L — ABNORMAL LOW (ref 135–145)
Total Bilirubin: 0.5 mg/dL (ref 0.3–1.2)
Total Protein: 6.2 g/dL — ABNORMAL LOW (ref 6.5–8.1)

## 2019-08-01 LAB — CBC WITH DIFFERENTIAL/PLATELET
Abs Immature Granulocytes: 0.05 10*3/uL (ref 0.00–0.07)
Basophils Absolute: 0 10*3/uL (ref 0.0–0.1)
Basophils Relative: 0 %
Eosinophils Absolute: 0 10*3/uL (ref 0.0–0.5)
Eosinophils Relative: 0 %
HCT: 20.7 % — ABNORMAL LOW (ref 36.0–46.0)
Hemoglobin: 6.7 g/dL — CL (ref 12.0–15.0)
Immature Granulocytes: 1 %
Lymphocytes Relative: 6 %
Lymphs Abs: 0.6 10*3/uL — ABNORMAL LOW (ref 0.7–4.0)
MCH: 37 pg — ABNORMAL HIGH (ref 26.0–34.0)
MCHC: 32.4 g/dL (ref 30.0–36.0)
MCV: 114.4 fL — ABNORMAL HIGH (ref 80.0–100.0)
Monocytes Absolute: 0.1 10*3/uL (ref 0.1–1.0)
Monocytes Relative: 1 %
Neutro Abs: 8.7 10*3/uL — ABNORMAL HIGH (ref 1.7–7.7)
Neutrophils Relative %: 92 %
Platelets: 105 10*3/uL — ABNORMAL LOW (ref 150–400)
RBC: 1.81 MIL/uL — ABNORMAL LOW (ref 3.87–5.11)
RDW: 13.8 % (ref 11.5–15.5)
WBC: 9.4 10*3/uL (ref 4.0–10.5)
nRBC: 0 % (ref 0.0–0.2)

## 2019-08-01 LAB — APTT: aPTT: 66 seconds — ABNORMAL HIGH (ref 24–36)

## 2019-08-01 LAB — URINALYSIS, ROUTINE W REFLEX MICROSCOPIC
Bilirubin Urine: NEGATIVE
Glucose, UA: NEGATIVE mg/dL
Hgb urine dipstick: NEGATIVE
Ketones, ur: NEGATIVE mg/dL
Leukocytes,Ua: NEGATIVE
Nitrite: NEGATIVE
Protein, ur: NEGATIVE mg/dL
Specific Gravity, Urine: 1.012 (ref 1.005–1.030)
pH: 5 (ref 5.0–8.0)

## 2019-08-01 LAB — SARS CORONAVIRUS 2 BY RT PCR (HOSPITAL ORDER, PERFORMED IN ~~LOC~~ HOSPITAL LAB): SARS Coronavirus 2: NEGATIVE

## 2019-08-01 LAB — PROTIME-INR
INR: 1.1 (ref 0.8–1.2)
Prothrombin Time: 13.6 seconds (ref 11.4–15.2)

## 2019-08-01 LAB — LACTIC ACID, PLASMA
Lactic Acid, Venous: 0.7 mmol/L (ref 0.5–1.9)
Lactic Acid, Venous: 0.9 mmol/L (ref 0.5–1.9)

## 2019-08-01 MED ORDER — ACETAMINOPHEN 500 MG PO TABS
1000.0000 mg | ORAL_TABLET | Freq: Once | ORAL | Status: AC
  Administered 2019-08-01: 1000 mg via ORAL
  Filled 2019-08-01: qty 2

## 2019-08-01 MED ORDER — SODIUM CHLORIDE 0.9 % IV SOLN
2.0000 g | Freq: Once | INTRAVENOUS | Status: AC
  Administered 2019-08-01: 2 g via INTRAVENOUS
  Filled 2019-08-01: qty 2

## 2019-08-01 MED ORDER — ONDANSETRON HCL 4 MG/2ML IJ SOLN
4.0000 mg | Freq: Once | INTRAMUSCULAR | Status: AC
  Administered 2019-08-01: 4 mg via INTRAVENOUS
  Filled 2019-08-01: qty 2

## 2019-08-01 MED ORDER — VANCOMYCIN HCL IN DEXTROSE 1-5 GM/200ML-% IV SOLN
1000.0000 mg | Freq: Once | INTRAVENOUS | Status: AC
  Administered 2019-08-01: 1000 mg via INTRAVENOUS
  Filled 2019-08-01: qty 200

## 2019-08-01 MED ORDER — SODIUM CHLORIDE 0.9% IV SOLUTION: Freq: Once | INTRAVENOUS | Status: DC

## 2019-08-01 MED ORDER — SODIUM CHLORIDE 0.9 % IV BOLUS
1000.0000 mL | Freq: Once | INTRAVENOUS | Status: AC
  Administered 2019-08-01: 1000 mL via INTRAVENOUS

## 2019-08-01 MED ORDER — SODIUM CHLORIDE 0.9 % IV SOLN
INTRAVENOUS | Status: DC
  Administered 2019-08-02 (×2): via INTRAVENOUS

## 2019-08-01 MED ORDER — VANCOMYCIN HCL IN DEXTROSE 750-5 MG/150ML-% IV SOLN
750.0000 mg | Freq: Every day | INTRAVENOUS | Status: DC
  Administered 2019-08-02: 750 mg via INTRAVENOUS
  Filled 2019-08-01 (×2): qty 150

## 2019-08-01 NOTE — ED Notes (Signed)
CRITICAL VALUE STICKER  CRITICAL VALUE: Hgb 6.7  RECEIVER (on-site recipient of call): Jake T RN  [place] NOTIFIED: 2000  MESSENGER (representative from lab): Ubaldo Glassing  MD NOTIFIED: Maryan Rued, MD  TIME OF NOTIFICATION: 2003  RESPONSE: see orders

## 2019-08-01 NOTE — Progress Notes (Signed)
A consult was received from an ED physician for vanc/cefepime per pharmacy dosing.  The patient's profile has been reviewed for ht/wt/allergies/indication/available labs.   A one time order has been placed for vanc 1g and cefepime 2g.  Further antibiotics/pharmacy consults should be ordered by admitting physician if indicated.                       Thank you, Kara Mead 08/01/2019  7:07 PM

## 2019-08-01 NOTE — ED Triage Notes (Signed)
Daughter states that patient had fever and confusion since yesterday. Had cancer treatment on Monday, unsure if reaction from that or not. Tylenol 1000mg  given 320pm. Pt having nausea

## 2019-08-01 NOTE — ED Notes (Signed)
Pt's daughter called out due to pt vomiting. Pt and daughter were provided with emesis bags and tissues. EDP notified of pt status.

## 2019-08-01 NOTE — ED Notes (Signed)
While sleeping py O2 sat decreased to 87% oxygen per N/c started at 3l n/c sat improved to 98%

## 2019-08-01 NOTE — Telephone Encounter (Signed)
Received MyChart message from patient's daughter- see that note.  Telephone call to patient's daughter, she states patient was doing better this morning however, upon her return from grocery shopping patient was severely confused and had a fever of 102.9. Daughter is sure this is her normal reaction to getting the Gemzar. Advised daughter that since we can not be sure that this is her normal reaction or an infection she needs to get her mother to the ED. Daughter agreed since the confusion is so pronounced this afternoon. Patient and daughter in route to [facility] ED. Charge nurse in ED notified of arrival.   Educated daughter that these messages should not be sent by [employer] We need her to call the office to discuss symptoms and side effects. Daughter verbalized an agreement.

## 2019-08-01 NOTE — H&P (Addendum)
History and Physical    Lisa Moyer SRP:[id number] DOB: 08-Feb-1935 DOA: 08/01/2019  Referring MD/NP/PA:   PCP: Lisa Moyer, No Pcp Per   Lisa Moyer coming from:  The Lisa Moyer is coming from home.  At baseline, pt is independent for most of ADL.        Chief Complaint: fever, left leg redness and AMS  HPI: Lisa Moyer is a 84 y.o. 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  Per her daughter, Lisa Moyer took the second dose of Gemzar on Monday with a reduced dosing as Lisa Moyer had fever, confusion after her first round of meds.  Daughter states that on Tuesday she started having low-grade temperatures, and then yesterday fever started increasing to 101 with associated confusion, generalized weakness, poor appetite. She spoke with the cancer center and they recommended she come here.  Lisa Moyer was noted to have  redness in the left upper leg.  She does not seem to have significant leg pain.  Lisa Moyer has nausea and dry heaves, but no active vomiting or abdominal pain.  She had one loose stool bowel movement on Tuesday night, which has resolved.  Lisa Moyer does not seem to have chest pain or cough.  She has some mild shortness of breath.  No rectal bleeding.  ED Course: pt was found to have WBC 9.4, hemoglobin 6.7 (8.1 on 07/29/2019), negative COVID-19 test, lactic acid of 0.7, INR 1.01, PTT 66, renal function close to baseline, sodium 129, temperature well 1.1, blood pressure 105/56, heart rate 80, oxygen saturation 90-94% on room air.  Chest x-ray negative.  Lisa Moyer is admitted to telemetry bed as inpatient.  Review of Systems: Could not reviewed due to altered mental status.  Allergy:  Allergies  Allergen Reactions  . Latex Other (See Comments)    She has a sensitivity to [facility] so prior drs told her to avoid  latex and listed it as an allergy    Past Medical History:  Diagnosis Date  . Arthritis    mild  . Blood transfusion without reported diagnosis   . DDD (degenerative disc disease), lumbar 05/09/2018   CT shows multilevel degenerative hypertrophic changes worst at L2-3  . Diverticulitis   . Fallopian tube cancer, carcinoma ([place])   . Fatty liver 06/28/2018   seen on CT  . Hyperlipemia   . Hypertension   . Kidney stone 05/2018   Right 28m nonobstructing renal stone seen on CT - pt asymptomatic  . Pneumonia   . Sliding hiatal hernia 06/28/2018   seen on CT    Past Surgical History:  Procedure Laterality Date  . ABDOMINAL HYSTERECTOMY  08/2016   total with BSO due to fallopian tube carcinoma, and pre-adjuvent and post-operative chemo as well each x 3 mos  . CHOLECYSTECTOMY  2001  . OTHER SURGICAL HISTORY  2017-2018   Chemotherapy     Social History:  reports that she has never smoked. She has never used smokeless tobacco. She reports that she does not drink alcohol or use drugs.  Family History:  Family History  Problem Relation Age of Onset  . Heart disease Mother        MI cause of death  . Colon cancer Mother        colon  . Pancreatic cancer Father        pancreatic     Prior to Admission medications  Medication Sig Start Date End Date Taking? Authorizing Provider  acetaminophen (TYLENOL) 500 MG tablet Take 1,000 mg by mouth every 6 (six) hours as needed for moderate pain.   Yes [provider]  amLODipine (NORVASC) 5 MG tablet Take 5 mg by mouth daily.   Yes [provider]  atenolol (TENORMIN) 25 MG tablet Take 0.5 tablets (12.5 mg total) by mouth daily. Lisa Moyer taking differently: Take 25 mg by mouth daily.  02/21/19  Yes Gorsuch, Ernst Spell, MD  Cholecalciferol (VITAMIN D PO) Take 1 tablet by mouth daily. D3 1000units   Yes [provider]  hydrochlorothiazide (HYDRODIURIL) 25 MG tablet Take 1 tablet (25 mg total) by mouth daily. Lisa Moyer  taking differently: Take 25 mg by mouth daily as needed (blood pressure).  07/29/19  Yes Heath Lark, MD  levothyroxine (SYNTHROID) 50 MCG tablet Take 0.5 tablets (25 mcg total) by mouth daily before breakfast. Lisa Moyer taking differently: Take 50 mcg by mouth daily before breakfast.  03/15/19  Yes Gorsuch, Ni, MD  losartan (COZAAR) 100 MG tablet Take 1 tablet (100 mg total) by mouth daily. 03/07/19  Yes Gorsuch, Ni, MD  morphine (MS CONTIN) 30 MG 12 hr tablet Take 1 tablet (30 mg total) by mouth every 12 (twelve) hours. 07/29/19  Yes Heath Lark, MD  Multiple Vitamin (MULTIVITAMIN) tablet Take 1 tablet by mouth daily.   Yes [provider]  ondansetron (ZOFRAN ODT) 8 MG disintegrating tablet Take 1 tablet (8 mg total) by mouth every 8 (eight) hours as needed for nausea or vomiting. 07/18/19  Yes Gorsuch, Ni, MD  ondansetron (ZOFRAN) 8 MG tablet Take 1 tablet (8 mg total) by mouth every 8 (eight) hours as needed for nausea or vomiting. 07/15/19  Yes Gorsuch, Ernst Spell, MD  oxyCODONE-acetaminophen (PERCOCET/ROXICET) 5-325 MG tablet Take 1 tablet by mouth every 4 (four) hours as needed for severe pain. 07/29/19  Yes Gorsuch, Ni, MD  polyethylene glycol (MIRALAX / GLYCOLAX) 17 g packet Take 17 g by mouth daily.    Yes [provider]  amLODipine (NORVASC) 10 MG tablet Take 1 tablet (10 mg total) by mouth daily. Lisa Moyer not taking: Reported on 08/01/2019 05/20/19   Heath Lark, MD    Physical Exam: Vitals:   08/01/19 1930 08/01/19 2000 08/01/19 2130 08/01/19 2200  BP: (!) 125/57 (!) 130/58 (!) 106/53 115/66  Pulse: 72 75 65 66  Resp: 16 19 (!) 24 18  Temp:      TempSrc:      SpO2: 93% 94% 91% 90%   General: Not in acute distress HEENT:       Eyes: PERRL, EOMI, no scleral icterus.       ENT: No discharge from the ears and nose, no pharynx injection, no tonsillar enlargement.        Neck: No JVD, no bruit, no mass felt. Heme: No neck lymph node enlargement. Cardiac: S1/S2, RRR, No murmurs, No  gallops or rubs. Respiratory: No rales, wheezing, rhonchi or rubs. GI: Soft, nondistended, nontender, no rebound pain, no organomegaly, BS present. GU: No hematuria Ext: 2+DP/PT pulse bilaterally. Has erythema in upper medial side of left leg. Also has mild erythema in left lower leg, has warmth, no significant tenderness. Musculoskeletal: No joint deformities, No joint redness or warmth, no limitation of ROM in spin. Skin: No rashes.  Neuro: confused, not oriented X3, cranial nerves II-XII grossly intact, moves all extremities. Psych: Lisa Moyer is not psychotic, no suicidal or hemocidal ideation.  Labs on Admission: I have personally reviewed  following labs and imaging studies  CBC: Recent Labs  Lab 07/29/19 1011 08/01/19 1834  WBC 4.9 9.4  NEUTROABS 2.9 8.7*  HGB 8.1* 6.7*  HCT 24.5* 20.7*  MCV 112.9* 114.4*  PLT 100* 932*   Basic Metabolic Panel: Recent Labs  Lab 07/29/19 1011 08/01/19 1834  NA 137 129*  K 4.1 4.3  CL 103 100  CO2 25 19*  GLUCOSE 106* 125*  BUN 31* 28*  CREATININE 1.46* 1.18*  CALCIUM 9.0 8.2*   GFR: Estimated Creatinine Clearance: 33.7 mL/min (A) (by C-G formula based on SCr of 1.18 mg/dL (H)). Liver Function Tests: Recent Labs  Lab 07/29/19 1011 08/01/19 1834  AST 19 31  ALT 18 26  ALKPHOS 90 79  BILITOT 0.3 0.5  PROT 6.8 6.2*  ALBUMIN 3.3* 2.8*   No results for input(s): LIPASE, AMYLASE in the last 168 hours. No results for input(s): AMMONIA in the last 168 hours. Coagulation Profile: Recent Labs  Lab 08/01/19 1834  INR 1.1   Cardiac Enzymes: No results for input(s): CKTOTAL, CKMB, CKMBINDEX, TROPONINI in the last 168 hours. BNP (last 3 results) No results for input(s): PROBNP in the last 8760 hours. HbA1C: No results for input(s): HGBA1C in the last 72 hours. CBG: No results for input(s): GLUCAP in the last 168 hours. Lipid Profile: No results for input(s): CHOL, HDL, LDLCALC, TRIG, CHOLHDL, LDLDIRECT in the last 72 hours.  Thyroid Function Tests: No results for input(s): TSH, T4TOTAL, FREET4, T3FREE, THYROIDAB in the last 72 hours. Anemia Panel: No results for input(s): VITAMINB12, FOLATE, FERRITIN, TIBC, IRON, RETICCTPCT in the last 72 hours. Urine analysis:    Component Value Date/Time   COLORURINE YELLOW 08/01/2019 1834   APPEARANCEUR CLEAR 08/01/2019 1834   LABSPEC 1.012 08/01/2019 1834   PHURINE 5.0 08/01/2019 1834   GLUCOSEU NEGATIVE 08/01/2019 1834   HGBUR NEGATIVE 08/01/2019 1834   BILIRUBINUR NEGATIVE 08/01/2019 1834   BILIRUBINUR negative 07/13/2018 1210   KETONESUR NEGATIVE 08/01/2019 1834   PROTEINUR NEGATIVE 08/01/2019 1834   UROBILINOGEN 0.2 07/13/2018 1210   NITRITE NEGATIVE 08/01/2019 1834   LEUKOCYTESUR NEGATIVE 08/01/2019 1834   Sepsis Labs: @LABRCNTIP (procalcitonin:4,lacticidven:4) ) Recent Results (from the past 240 hour(s))  SARS Coronavirus 2 [facility] order, Performed in [facility] hospital lab) Nasopharyngeal Nasopharyngeal Swab     Status: None   Collection Time: 08/01/19  9:49 PM   Specimen: Nasopharyngeal Swab  Result Value Ref Range Status   SARS Coronavirus 2 NEGATIVE NEGATIVE Final    Comment: (NOTE) If result is NEGATIVE SARS-CoV-2 target nucleic acids are NOT DETECTED. The SARS-CoV-2 RNA is generally detectable in upper and lower  respiratory specimens during the acute phase of infection. The lowest  concentration of SARS-CoV-2 viral copies this assay can detect is 250  copies / mL. A negative result does not preclude SARS-CoV-2 infection  and should not be used as the sole basis for treatment or other  Lisa Moyer management decisions.  A negative result may occur with  improper specimen collection / handling, submission of specimen other  than nasopharyngeal swab, presence of viral mutation(s) within the  areas targeted by this assay, and inadequate number of viral copies  (<250 copies / mL). A negative result must be combined with clinical  observations,  Lisa Moyer history, and epidemiological information. If result is POSITIVE SARS-CoV-2 target nucleic acids are DETECTED. The SARS-CoV-2 RNA is generally detectable in upper and lower  respiratory specimens dur ing the acute phase of infection.  Positive  results are indicative of  active infection with SARS-CoV-2.  Clinical  correlation with Lisa Moyer history and other diagnostic information is  necessary to determine Lisa Moyer infection status.  Positive results do  not rule out bacterial infection or co-infection with other viruses. If result is PRESUMPTIVE POSTIVE SARS-CoV-2 nucleic acids MAY BE PRESENT.   A presumptive positive result was obtained on the submitted specimen  and confirmed on repeat testing.  While 2019 novel coronavirus  (SARS-CoV-2) nucleic acids may be present in the submitted sample  additional confirmatory testing may be necessary for epidemiological  and / or clinical management purposes  to differentiate between  SARS-CoV-2 and other Sarbecovirus currently known to infect humans.  If clinically indicated additional testing with an alternate test  methodology [phone]) is advised. The SARS-CoV-2 RNA is generally  detectable in upper and lower respiratory sp ecimens during the acute  phase of infection. The expected result is Negative. Fact Sheet for Patients:  StrictlyIdeas.no Fact Sheet for Healthcare Providers: BankingDealers.co.za This test is not yet approved or cleared by the [place] FDA and has been authorized for detection and/or diagnosis of SARS-CoV-2 by FDA under an Emergency Use Authorization (EUA).  This EUA will remain in effect (meaning this test can be used) for the duration of the COVID-19 declaration under Section 564(b)(1) of the Act, 21 U.S.C. section 360bbb-3(b)(1), unless the authorization is terminated or revoked sooner. Performed at [facility], [place] [address]  [place], [place] [address]      Radiological Exams on Admission: Dg Chest Port 1 View  Result Date: 08/01/2019 CLINICAL DATA:  Daughter states that Lisa Moyer had fever and confusion since yesterday. Had cancer treatment on Monday. PT HX: non smoker EXAM: PORTABLE CHEST 1 VIEW COMPARISON:  07/17/2019 FINDINGS: Cardiac silhouette normal in size and configuration. No mediastinal or hilar masses or convincing adenopathy. Clear lungs.  No pleural effusion or pneumothorax. Stable right anterior chest wall Port-A-Cath. Skeletal structures are demineralized but grossly intact. IMPRESSION: No acute cardiopulmonary disease. Electronically Signed   By: Lajean Manes M.D.   On: 08/01/2019 19:23     EKG: Independently reviewed.  Sinus rhythm, QTC 417, LAD, poor R wave progression, low voltage.   Assessment/Plan Principal Problem:   Cellulitis of left leg Active Problems:   Fallopian tube cancer, carcinoma (HCC)   Anemia due to antineoplastic chemotherapy   Essential hypertension   CKD (chronic kidney disease), stage III   Acquired hypothyroidism   Sepsis ([place])   Acute metabolic encephalopathy   Hyponatremia   Sepsis due to possible cellulitis of left leg: Lisa Moyer meets criteria for sepsis with fever and tachypnea.  Lactic acid is normal.  Currently hemodynamically stable.  -will admit to tele bed as inpt -vancomycin (pt also received 1 dose of cefepime in the ED) -esr and crp -will get Procalcitonin and trend lactic acid levels per sepsis protocol. -IVF: 1L of NS bolus in ED, followed by 75 cc/h  -will get LE venous doppler to r/o DVT  Fallopian tube cancer, carcinoma ([place]): pt is on Gemzar, following up with Dr. Addison Bailey -f/u with Dr. Addison Bailey  Anemia due to antineoplastic chemotherapy:  Hgb dropped from 8.1 on 07/29/19 to 6.7. No active bleeding. -transfuse 1 U of blood  Essential hypertension: Bp is 105/56 -hold blood pressure medications (amlodipine, HCTZ, atenolol, Cozaar) since Lisa Moyer  at risk of developing hypotension -prn hydralazine orally  CKD (chronic kidney disease), stage III: stable.  Baseline creatinine 1.0-1.4.  Her creatinine is 1.18, BUN 28.  Acquired hypothyroidism: -Synthroid  Acute  metabolic encephalopathy: Possibly due to ongoing infection and sepsis -Frequent neuro check  Hyponatremia: Na 129.  Likely due to his decreased oral intake. -IV fluid as above -Follow-up BMP    Inpatient status:  # Lisa Moyer requires inpatient status due to high intensity of service, high risk for further deterioration and high frequency of surveillance required.  I certify that at the point of admission it is my clinical judgment that the Lisa Moyer will require inpatient hospital care spanning beyond 2 midnights from the point of admission.  . 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 Now Lisa Moyer has presenting with AMS, sepsis due to possible left leg cellulitis, worsening anemia . The worrisome physical exam findings include confusion, left leg redness . The initial radiographic and laboratory data are worrisome because of hyponatremia, worsening anemia . Current medical needs: please see my assessment and plan . Predictability of an adverse outcome (risk): Lisa Moyer's multiple completely days, now presents with altered mental status, sepsis due to possible left leg cellulitis.  Lisa Moyer also has worsening anemia.  Her presentation is highly complicated.  Lisa Moyer is at high risk for deteriorating given immunosuppressed status.  Will need to be treated in the hospital for at least 2 days.           DVT ppx: SCD Code Status: DNR per her daughter Family Communication:   Yes, Lisa Moyer's  daughter  at bed side Disposition Plan:  Anticipate discharge back to previous home environment Consults called:  none Admission status:   Inpatient/tele       Date of Service 08/02/2019    [place] [facility]   If 7PM-7AM, please contact night-coverage [email or web address] Password [email or web address] 08/02/2019, 12:53 AM

## 2019-08-01 NOTE — ED Notes (Signed)
While removing pt's clothing to place purewick, significant redness was noted to her upper left thigh.  Area is warm to the touch and redness is circumferential.  Notified EDP of same.

## 2019-08-01 NOTE — Progress Notes (Signed)
Pharmacy Antibiotic Note  Lisa Moyer is a 84 y.o. female admitted on 08/01/2019 with cellulitis.  Pharmacy has been consulted for vancomycin dosing.  Plan: Vancomycin 1 Gm x1 then 750 mg IV q24h for est AUC = 523 Goal AUC = 400-550 F/u scr/cultures/levels     Temp (24hrs), Avg:101.1 F (38.4 C), Min:101.1 F (38.4 C), Max:101.1 F (38.4 C)  Recent Labs  Lab 07/29/19 1011 08/01/19 1834 08/01/19 1902  WBC 4.9 9.4  --   CREATININE 1.46* 1.18*  --   LATICACIDVEN  --   --  0.7    Estimated Creatinine Clearance: 33.7 mL/min (A) (by C-G formula based on SCr of 1.18 mg/dL (H)).    Allergies  Allergen Reactions  . Latex Other (See Comments)    She has a sensitivity to [facility] so prior drs told her to avoid latex and listed it as an allergy    Antimicrobials this admission: 10/1 cefepime >>  10/1 vancomycin >>   Dose adjustments this admission:   Microbiology results:  BCx:   UCx:    Sputum:    MRSA PCR:   Thank you for allowing pharmacy to be a part of this patient's care.  Dorrene German 08/01/2019 10:20 PM

## 2019-08-01 NOTE — ED Notes (Signed)
Pt had an episode of diarrhea while laying in the bed.  As I was about to begin cleaning the pt, she advised she was going to have another bowel movement, so she was placed on a bedpan. Pt was then cleaned, soiled bedding changed, given a clean hospital gown and placed in a hospital brief.

## 2019-08-01 NOTE — ED Provider Notes (Signed)
[facility] DEPT Provider Note   CSN: [id number] Arrival date & time: 08/01/19  1619     History   Chief Complaint Chief Complaint  Patient presents with  . Fever  . Altered Mental Status    HPI Lisa Moyer is a 84 y.o. female.     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  Patient took the second dose of Gemzar on Monday with a reduced dosing as patient had fever, confusion after her first round of meds.  Daughter states that on Tuesday she started having low-grade temperatures and then yesterday fever started increasing to 101 with associated confusion, generalized weakness, poor appetite.  Fever was controlled with Tylenol however patient had recurrent fever today up to 102.  She spoke with the cancer center and they recommended she come here.  Daughter states when she gets up to walk she looks slightly winded and she thought she may have heard her wheezing a little bit.  She has not had any contacts with COVID positive people.  She has not had any vomiting or diarrhea but when her fever is elevated she is more confused and she has occasional dry heaving.  She is not complained of any abdominal pain, chest pain and there is been no notable swelling in her legs.  The history is provided by a caregiver.  Fever Max temp prior to arrival:  102 Temp source:  Oral Severity:  Moderate Onset quality:  Gradual Duration:  2 days Timing:  Intermittent Progression:  Waxing and waning Chronicity:  Recurrent Relieved by:  Acetaminophen Worsened by:  Nothing Ineffective treatments:  None tried Associated symptoms: chills, confusion and myalgias   Associated symptoms: no chest pain, no congestion, no cough, no diarrhea, no dysuria and no vomiting   Altered Mental Status Presenting  symptoms: confusion   Associated symptoms: fever   Associated symptoms: no vomiting     Past Medical History:  Diagnosis Date  . Arthritis    mild  . Blood transfusion without reported diagnosis   . DDD (degenerative disc disease), lumbar 05/09/2018   CT shows multilevel degenerative hypertrophic changes worst at L2-3  . Diverticulitis   . Fallopian tube cancer, carcinoma ([place])   . Fatty liver 06/28/2018   seen on CT  . Hyperlipemia   . Hypertension   . Kidney stone 05/2018   Right 56mm nonobstructing renal stone seen on CT - pt asymptomatic  . Pneumonia   . Sliding hiatal hernia 06/28/2018   seen on CT    Patient Active Problem List   Diagnosis Date Noted  . Recurrent fever 07/22/2019  . Cancer associated pain 07/05/2019  . Ovarian cancer ([place]) 04/23/2019  . Insomnia disorder 04/04/2019  . Acquired hypothyroidism 03/15/2019  . Abdominal pain 02/22/2019  . Other proteinuria 02/22/2019  . CKD (chronic kidney disease), stage III 02/22/2019  . Nausea & vomiting 02/22/2019  . Port-A-Cath in place 02/07/2019  . Physical debility 01/11/2019  . Goals of care, counseling/discussion 01/11/2019  . Dyspnea on exertion 01/01/2019  . Pancytopenia, acquired ([place]) 01/01/2019  . Essential hypertension 01/01/2019  . Anemia due to antineoplastic chemotherapy 12/31/2018  . Deficiency anemia 12/31/2018  . Diarrhea 12/31/2018  . Other fatigue 12/31/2018  . Fallopian tube cancer, carcinoma ([place]) 12/24/2018  . Diverticulitis of colon 06/27/2018    Past Surgical History:  Procedure Laterality Date  .  ABDOMINAL HYSTERECTOMY  08/2016   total with BSO due to fallopian tube carcinoma, and pre-adjuvent and post-operative chemo as well each x 3 mos  . CHOLECYSTECTOMY  2001  . OTHER SURGICAL HISTORY  2017-2018   Chemotherapy      OB History   No obstetric history on file.      Home Medications    Prior to Admission medications   Medication Sig Start Date End Date Taking?  Authorizing Provider  Acetaminophen 325 MG CAPS Take 650 mg by mouth every 6 (six) hours as needed.    [provider]  amLODipine (NORVASC) 10 MG tablet Take 1 tablet (10 mg total) by mouth daily. Patient taking differently: Take 10 mg by mouth daily. 5 mg daily 05/20/19   Heath Lark, MD  atenolol (TENORMIN) 25 MG tablet Take 0.5 tablets (12.5 mg total) by mouth daily. Patient taking differently: Take 25 mg by mouth daily.  02/21/19   Heath Lark, MD  Cholecalciferol (VITAMIN D PO) Take 1 tablet by mouth daily. D3 1000units    [provider]  diphenhydramine-acetaminophen (TYLENOL PM) 25-500 MG TABS tablet Take 1 tablet by mouth at bedtime as needed.    [provider]  hydrochlorothiazide (HYDRODIURIL) 25 MG tablet Take 1 tablet (25 mg total) by mouth daily. 07/29/19   Heath Lark, MD  levothyroxine (SYNTHROID) 50 MCG tablet Take 0.5 tablets (25 mcg total) by mouth daily before breakfast. Patient taking differently: Take 50 mcg by mouth daily before breakfast. 25 MCG 03/15/19   Heath Lark, MD  losartan (COZAAR) 100 MG tablet Take 1 tablet (100 mg total) by mouth daily. 03/07/19   Heath Lark, MD  morphine (MS CONTIN) 30 MG 12 hr tablet Take 1 tablet (30 mg total) by mouth every 12 (twelve) hours. 07/29/19   Heath Lark, MD  Multiple Vitamin (MULTIVITAMIN) tablet Take 1 tablet by mouth daily.    [provider]  ondansetron (ZOFRAN ODT) 8 MG disintegrating tablet Take 1 tablet (8 mg total) by mouth every 8 (eight) hours as needed for nausea or vomiting. 07/18/19   Heath Lark, MD  ondansetron (ZOFRAN) 8 MG tablet Take 1 tablet (8 mg total) by mouth every 8 (eight) hours as needed for nausea or vomiting. 07/15/19   Heath Lark, MD  oxyCODONE-acetaminophen (PERCOCET/ROXICET) 5-325 MG tablet Take 1 tablet by mouth every 4 (four) hours as needed for severe pain. 07/29/19   Heath Lark, MD  polyethylene glycol (MIRALAX / GLYCOLAX) 17 g packet Take 17 g by mouth 2 (two) times  daily.    [provider]    Family History Family History  Problem Relation Age of Onset  . Heart disease Mother        MI cause of death  . Colon cancer Mother        colon  . Pancreatic cancer Father        pancreatic    Social History Social History   Tobacco Use  . Smoking status: Never Smoker  . Smokeless tobacco: Never Used  Substance Use Topics  . Alcohol use: Never    Frequency: Never  . Drug use: Never     Allergies   Latex   Review of Systems Review of Systems  Constitutional: Positive for chills and fever.  HENT: Negative for congestion.   Respiratory: Negative for cough.   Cardiovascular: Negative for chest pain.  Gastrointestinal: Negative for diarrhea and vomiting.  Genitourinary: Negative for dysuria.  Musculoskeletal: Positive for myalgias.  Psychiatric/Behavioral:  Positive for confusion.  All other systems reviewed and are negative.    Physical Exam Updated Vital Signs BP (!) 105/56 (BP Location: Left Arm)   Pulse 80   Temp (!) 101.1 F (38.4 C) (Oral)   Resp 20   SpO2 90%   Physical Exam Vitals signs and nursing note reviewed.  Constitutional:      General: She is not in acute distress.    Appearance: She is well-developed and normal weight.  HENT:     Head: Normocephalic and atraumatic.  Eyes:     Conjunctiva/sclera: Conjunctivae normal.     Pupils: Pupils are equal, round, and reactive to light.  Neck:     Musculoskeletal: Normal range of motion and neck supple.  Cardiovascular:     Rate and Rhythm: Normal rate and regular rhythm.     Heart sounds: No murmur.  Pulmonary:     Effort: Pulmonary effort is normal. No respiratory distress.     Breath sounds: Normal breath sounds. No wheezing or rales.  Abdominal:     General: There is no distension.     Palpations: Abdomen is soft.     Tenderness: There is no abdominal tenderness. There is no guarding or rebound.  Musculoskeletal: Normal range of motion.         General: No tenderness.     Right lower leg: No edema.     Left lower leg: No edema.     Comments: Redness, erythema but no pain or crepitus of the left upper leg  Skin:    General: Skin is warm and dry.     Findings: No erythema or rash.  Neurological:     General: No focal deficit present.     Mental Status: She is alert and oriented to person, place, and time. Mental status is at baseline.  Psychiatric:        Behavior: Behavior normal.     Comments: Patient speaks a different language but seems to be responding to her daughter appropriately.  She is calm and cooperative on exam      ED Treatments / Results  Labs (all labs ordered are listed, but only abnormal results are displayed) Labs Reviewed  COMPREHENSIVE METABOLIC PANEL - Abnormal; Notable for the following components:      Result Value   Sodium 129 (*)    CO2 19 (*)    Glucose, Bld 125 (*)    BUN 28 (*)    Creatinine, Ser 1.18 (*)    Calcium 8.2 (*)    Total Protein 6.2 (*)    Albumin 2.8 (*)    GFR calc non Af Amer 43 (*)    GFR calc Af Amer 50 (*)    All other components within normal limits  CBC WITH DIFFERENTIAL/PLATELET - Abnormal; Notable for the following components:   RBC 1.81 (*)    Hemoglobin 6.7 (*)    HCT 20.7 (*)    MCV 114.4 (*)    MCH 37.0 (*)    Platelets 105 (*)    Neutro Abs 8.7 (*)    Lymphs Abs 0.6 (*)    All other components within normal limits  APTT - Abnormal; Notable for the following components:   aPTT 66 (*)    All other components within normal limits  CULTURE, BLOOD (ROUTINE X 2)  CULTURE, BLOOD (ROUTINE X 2)  URINE CULTURE  LACTIC ACID, PLASMA  PROTIME-INR  LACTIC ACID, PLASMA  URINALYSIS, ROUTINE W REFLEX MICROSCOPIC  TYPE AND  SCREEN  PREPARE RBC (CROSSMATCH)    EKG None  Radiology Dg Chest Port 1 View  Result Date: 08/01/2019 CLINICAL DATA:  Daughter states that patient had fever and confusion since yesterday. Had cancer treatment on Monday. PT HX: non smoker  EXAM: PORTABLE CHEST 1 VIEW COMPARISON:  07/17/2019 FINDINGS: Cardiac silhouette normal in size and configuration. No mediastinal or hilar masses or convincing adenopathy. Clear lungs.  No pleural effusion or pneumothorax. Stable right anterior chest wall Port-A-Cath. Skeletal structures are demineralized but grossly intact. IMPRESSION: No acute cardiopulmonary disease. Electronically Signed   By: Lajean Manes M.D.   On: 08/01/2019 19:23    Procedures Procedures (including critical care time)  Medications Ordered in ED Medications - No data to display   Initial Impression / Assessment and Plan / ED Course  I have reviewed the triage vital signs and the nursing notes.  Pertinent labs & imaging results that were available during my care of the patient were reviewed by me and considered in my medical decision making (see chart for details).        Elderly female with recurrent fallopian tube cancer who is taking her second dose of Gemzar on Monday and now has fever, intermittent confusion and shortness of breath with exertion.  Patient's oxygen saturation is 90%, temperature of 101.1 on arrival here in soft blood pressure of 105/56.  Similar symptoms occurred after patient's first treatment and she was seen in the emergency room with no significant change in labs and blood cultures that ended up being negative.  Patient had a negative COVID at that time and she decided to go home.  Patient has had ongoing issues with pancytopenia and with ongoing fever today oncology recommended she come to the emergency room.  Code sepsis was initiated given patient's symptoms.  Labs and imaging pending.  Patient covered with cefepime and vancomycin.    Concern for neutropenic fever versus medication reaction.  Lower suspicion for COVID at this time.\  9:17 PM On reevaluation further evaluation of the patient's left lower extremity shows a large area of erythema and warmth concerning for cellulitis.  Patient  is also anemic with a hemoglobin of 6.7 and mild hyponatremia of 129.  Given the cellulitis, anemia and mild hyponatremia in the setting of ongoing fever will admit for IV antibiotics and for a unit of blood.  She was scheduled to get blood on Monday.  Low suspicion for COVID at this time.  Lactate is within normal limits and vital signs have remained stable.  cxr wnl. Today's Vitals   08/01/19 1626 08/01/19 1629  BP:  (!) 105/56  Pulse:  80  Resp:  20  Temp:  (!) 101.1 F (38.4 C)  TempSrc:  Oral  SpO2:  90%  PainSc: 0-No pain    There is no height or weight on file to calculate BMI.  CRITICAL CARE Performed by: Allyson Tineo Total critical care time: 30 minutes Critical care time was exclusive of separately billable procedures and treating other patients. Critical care was necessary to treat or prevent imminent or life-threatening deterioration. Critical care was time spent personally by me on the following activities: development of treatment plan with patient and/or surrogate as well as nursing, discussions with consultants, evaluation of patient's response to treatment, examination of patient, obtaining history from patient or surrogate, ordering and performing treatments and interventions, ordering and review of laboratory studies, ordering and review of radiographic studies, pulse oximetry and re-evaluation of patient's condition.   Final Clinical  Impressions(s) / ED Diagnoses   Final diagnoses:  Cellulitis of left lower extremity  Anemia due to antineoplastic chemotherapy    ED Discharge Orders    None       Blanchie Dessert, MD 08/01/19 2119

## 2019-08-02 ENCOUNTER — Inpatient Hospital Stay (HOSPITAL_COMMUNITY): Payer: Medicare Other

## 2019-08-02 DIAGNOSIS — E871 Hypo-osmolality and hyponatremia: Secondary | ICD-10-CM

## 2019-08-02 DIAGNOSIS — L0390 Cellulitis, unspecified: Secondary | ICD-10-CM

## 2019-08-02 DIAGNOSIS — N1831 Chronic kidney disease, stage 3a: Secondary | ICD-10-CM

## 2019-08-02 DIAGNOSIS — L03116 Cellulitis of left lower limb: Secondary | ICD-10-CM

## 2019-08-02 LAB — BASIC METABOLIC PANEL
Anion gap: 10 (ref 5–15)
BUN: 21 mg/dL (ref 8–23)
CO2: 19 mmol/L — ABNORMAL LOW (ref 22–32)
Calcium: 8.2 mg/dL — ABNORMAL LOW (ref 8.9–10.3)
Chloride: 104 mmol/L (ref 98–111)
Creatinine, Ser: 0.91 mg/dL (ref 0.44–1.00)
GFR calc Af Amer: >60 mL/min (ref >60)
GFR calc non Af Amer: 59 mL/min — ABNORMAL LOW (ref >60)
Glucose, Bld: 106 mg/dL — ABNORMAL HIGH (ref 70–99)
Potassium: 4.1 mmol/L (ref 3.5–5.1)
Sodium: 133 mmol/L — ABNORMAL LOW (ref 135–145)

## 2019-08-02 LAB — SEDIMENTATION RATE: Sed Rate: 117 mm/hr — ABNORMAL HIGH (ref 0–22)

## 2019-08-02 LAB — CBC
HCT: 22.9 % — ABNORMAL LOW (ref 36.0–46.0)
Hemoglobin: 7.6 g/dL — ABNORMAL LOW (ref 12.0–15.0)
MCH: 35.8 pg — ABNORMAL HIGH (ref 26.0–34.0)
MCHC: 33.2 g/dL (ref 30.0–36.0)
MCV: 108 fL — ABNORMAL HIGH (ref 80.0–100.0)
Platelets: 95 10*3/uL — ABNORMAL LOW (ref 150–400)
RBC: 2.12 MIL/uL — ABNORMAL LOW (ref 3.87–5.11)
RDW: 17.9 % — ABNORMAL HIGH (ref 11.5–15.5)
WBC: 7.2 10*3/uL (ref 4.0–10.5)
nRBC: 0 % (ref 0.0–0.2)

## 2019-08-02 LAB — PREPARE RBC (CROSSMATCH)

## 2019-08-02 LAB — PROCALCITONIN: Procalcitonin: 1.06 ng/mL

## 2019-08-02 LAB — C-REACTIVE PROTEIN: CRP: 25.7 mg/dL — ABNORMAL HIGH (ref <1.0)

## 2019-08-02 LAB — ABO/RH: ABO/RH(D): A POS

## 2019-08-02 MED ORDER — LEVOTHYROXINE SODIUM 50 MCG PO TABS
50.0000 ug | ORAL_TABLET | Freq: Every day | ORAL | Status: DC
  Administered 2019-08-02 – 2019-08-03 (×2): 50 ug via ORAL
  Filled 2019-08-02 (×2): qty 1

## 2019-08-02 MED ORDER — SODIUM CHLORIDE 0.9% IV SOLUTION
Freq: Once | INTRAVENOUS | Status: AC
  Administered 2019-08-02: 15:00:00 via INTRAVENOUS

## 2019-08-02 MED ORDER — ACETAMINOPHEN 325 MG PO TABS
650.0000 mg | ORAL_TABLET | Freq: Once | ORAL | Status: AC
  Administered 2019-08-02: 650 mg via ORAL
  Filled 2019-08-02: qty 2

## 2019-08-02 MED ORDER — DIPHENHYDRAMINE HCL 25 MG PO CAPS
25.0000 mg | ORAL_CAPSULE | Freq: Once | ORAL | Status: AC
  Administered 2019-08-02: 25 mg via ORAL
  Filled 2019-08-02: qty 1

## 2019-08-02 MED ORDER — ONDANSETRON HCL 4 MG PO TABS
4.0000 mg | ORAL_TABLET | Freq: Four times a day (QID) | ORAL | Status: DC | PRN
  Filled 2019-08-02: qty 1

## 2019-08-02 MED ORDER — ACETAMINOPHEN 650 MG RE SUPP: 650.0000 mg | Freq: Four times a day (QID) | RECTAL | Status: DC | PRN

## 2019-08-02 MED ORDER — ADULT MULTIVITAMIN W/MINERALS CH
1.0000 | ORAL_TABLET | Freq: Every day | ORAL | Status: DC
  Administered 2019-08-02 – 2019-08-03 (×2): 1 via ORAL
  Filled 2019-08-02 (×2): qty 1

## 2019-08-02 MED ORDER — SODIUM CHLORIDE 0.9% FLUSH
10.0000 mL | INTRAVENOUS | Status: DC | PRN
  Administered 2019-08-02 – 2019-08-03 (×4): 10 mL
  Filled 2019-08-02 (×4): qty 40

## 2019-08-02 MED ORDER — ONDANSETRON HCL 4 MG/2ML IJ SOLN: 4.0000 mg | Freq: Four times a day (QID) | INTRAMUSCULAR | Status: DC | PRN

## 2019-08-02 MED ORDER — VITAMIN D 25 MCG (1000 UNIT) PO TABS
1000.0000 [IU] | ORAL_TABLET | Freq: Every day | ORAL | Status: DC
  Administered 2019-08-02 – 2019-08-03 (×2): 1000 [IU] via ORAL
  Filled 2019-08-02 (×2): qty 1

## 2019-08-02 MED ORDER — POLYETHYLENE GLYCOL 3350 17 G PO PACK: 17.0000 g | PACK | Freq: Every day | ORAL | Status: DC | PRN

## 2019-08-02 MED ORDER — INFLUENZA VAC A&B SA ADJ QUAD 0.5 ML IM PRSY
0.5000 mL | PREFILLED_SYRINGE | INTRAMUSCULAR | Status: AC
  Administered 2019-08-03: 0.5 mL via INTRAMUSCULAR
  Filled 2019-08-02: qty 0.5

## 2019-08-02 MED ORDER — HYDRALAZINE HCL 25 MG PO TABS: 25.0000 mg | ORAL_TABLET | Freq: Three times a day (TID) | ORAL | Status: DC | PRN

## 2019-08-02 MED ORDER — CHLORHEXIDINE GLUCONATE CLOTH 2 % EX PADS
6.0000 | MEDICATED_PAD | Freq: Every day | CUTANEOUS | Status: DC
  Administered 2019-08-02 – 2019-08-03 (×2): 6 via TOPICAL

## 2019-08-02 MED ORDER — ACETAMINOPHEN 325 MG PO TABS
650.0000 mg | ORAL_TABLET | Freq: Four times a day (QID) | ORAL | Status: DC | PRN
  Administered 2019-08-02 – 2019-08-03 (×2): 650 mg via ORAL
  Filled 2019-08-02 (×3): qty 2

## 2019-08-02 MED ORDER — OXYCODONE-ACETAMINOPHEN 5-325 MG PO TABS
1.0000 | ORAL_TABLET | ORAL | Status: DC | PRN
  Administered 2019-08-02 – 2019-08-03 (×5): 1 via ORAL
  Filled 2019-08-02 (×5): qty 1

## 2019-08-02 MED ORDER — MORPHINE SULFATE ER 15 MG PO TBCR
15.0000 mg | EXTENDED_RELEASE_TABLET | Freq: Two times a day (BID) | ORAL | Status: DC
  Administered 2019-08-02 – 2019-08-03 (×3): 15 mg via ORAL
  Filled 2019-08-02 (×3): qty 1

## 2019-08-02 NOTE — Progress Notes (Signed)
Left lower extremity venous duplex has been completed. Preliminary results can be found in CV Proc through chart review.   08/02/19 9:48 AM Carlos Levering RVT

## 2019-08-02 NOTE — Evaluation (Signed)
Physical Therapy Evaluation Patient Details Name: Lisa Moyer MRN: [id number] DOB: Mar 11, 1935 Today's Date: 08/02/2019   History of Present Illness  84 y.o. 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  Clinical Impression  Pt admitted with above diagnosis.  Pt quite independent at her baseline. Min/guard for hallway distance amb, requiring support of IV pole with incr distance; SpO2 WNL on RA. Recommend cane d/t pt higher level balance deficits, unsteady in am per pt dtr but no falls. Pt will not use RW but she is agreeable to single point cane. Will follow in acute setting  Pt currently with functional limitations due to the deficits listed below (see PT Problem List). Pt will benefit from skilled PT to increase their independence and safety with mobility to allow discharge to the venue listed below.       Follow Up Recommendations No PT follow up    Equipment Recommendations  Cane    Recommendations for Other Services       Precautions / Restrictions Precautions Precautions: Fall Restrictions Weight Bearing Restrictions: No      Mobility  Bed Mobility Overal bed mobility: Modified Independent                Transfers Overall transfer level: Needs assistance Equipment used: None Transfers: Sit to/from Stand Sit to Stand: Supervision         General transfer comment: for safety  Ambulation/Gait Ambulation/Gait assistance: Min guard;Min assist Gait Distance (Feet): 250 Feet Assistive device: None;IV Pole Gait Pattern/deviations: Step-through pattern;Decreased stride length;Narrow base of support     General Gait Details: pt with unsteady gait however no overt LOB, narrow base of support with mild scissoring at times, pt required UE (IV pole) support with incr distance  and fatigue;  Stairs            Wheelchair Mobility    Modified Rankin (Stroke Patients Only)       Balance Overall balance assessment: Needs assistance   Sitting balance-Leahy Scale: Normal       Standing balance-Leahy Scale: Fair Standing balance comment: higher level balance deficits, unable to tolerate challenges                             Pertinent Vitals/Pain Pain Assessment: No/denies pain    Home Living Family/patient expects to be discharged to:: Private residence Living Arrangements: Children Available Help at Discharge: Family;Available 24 hours/day Type of Home: (condo) Home Access: Stairs to enter   [employer] of Steps: flight Home Layout: One level Home Equipment: Walker - 2 wheels Additional Comments: has RW does not and will not use it per pt dtr    Prior Function Level of Independence: Independent         Comments: dtr from [place] is here staying with pt for now; son lives here     Hand Dominance        Extremity/Trunk Assessment   Upper Extremity Assessment Upper Extremity Assessment: Overall WFL for tasks assessed    Lower Extremity Assessment Lower Extremity Assessment: Overall WFL for tasks assessed       Communication   Communication: No difficulties  Cognition Arousal/Alertness: Awake/alert Behavior During Therapy: WFL for tasks assessed/performed Overall Cognitive Status: Within Functional Limits for tasks assessed  General Comments      Exercises     Assessment/Plan    PT Assessment Patient needs continued PT services  PT Problem List Decreased balance;Decreased knowledge of use of DME;Decreased activity tolerance       PT Treatment Interventions DME instruction;Therapeutic exercise;Gait training;Stair training;Patient/family education;Balance training    PT Goals (Current goals can be found in the Care Plan section)  Acute Rehab  PT Goals PT Goal Formulation: With patient/family Time For Goal Achievement: 08/09/19 Potential to Achieve Goals: Good    Frequency Min 3X/week   Barriers to discharge        Co-evaluation               AM-PAC PT "6 Clicks" Mobility  Outcome Measure Help needed turning from your back to your side while in a flat bed without using bedrails?: None Help needed moving from lying on your back to sitting on the side of a flat bed without using bedrails?: None Help needed moving to and from a bed to a chair (including a wheelchair)?: A Little Help needed standing up from a chair using your arms (e.g., wheelchair or bedside chair)?: A Little Help needed to walk in hospital room?: A Little Help needed climbing 3-5 steps with a railing? : A Little 6 Click Score: 20    End of Session Equipment Utilized During Treatment: Gait belt Activity Tolerance: Patient tolerated treatment well Patient left: in chair;with call bell/phone within reach;with chair alarm set   PT Visit Diagnosis: Unsteadiness on feet (R26.81)    Time: [id number] PT Time Calculation (min) (ACUTE ONLY): 20 min   Charges:   PT Evaluation $PT Eval Low Complexity: 1 Low          Kenyon Ana, PT  Pager: [phone] Acute Rehab Dept [facility]): [phone]   08/02/2019   [facility] 08/02/2019, 11:07 AM

## 2019-08-02 NOTE — Progress Notes (Addendum)
[place] OFFICE PROGRESS NOTE I seen the patient, examined her and agree with the documentation and assessment of plan of care Patient Care Team: Patient, No Pcp Per as PCP - General ([place]) Ileana Roup, MD as Consulting Physician (General Surgery)  ASSESSMENT & PLAN:  Fallopian tube cancer, carcinoma ([place]) Overall, she tolerated chemotherapy very poorly I do not believe she can resume gemcitabine anytime soon For now, I recommend we treat her aggressively with supportive care  Cancer associated pain Her pain control is good She will continue MS Contin twice a day along with oxycodone/acetaminophen as needed Family notes increased fatigue and questions whether is related to her MS Contin Was on 30 mg every 12 hours as an outpatient but dose has now been reduced to 15 mg every 12 hours We will continue current dose  Pancytopenia, acquired ([place]) White blood cell count remains normal She has mild thrombocytopenia secondary to recent chemotherapy Was more anemic on admission is status post 1 unit packed red blood cells on 08/02/2019 Recommend PRBC transfusion to keep hemoglobin above 8 She is still symptomatic I recommend 1 more unit of blood transfusion today We discussed some of the risks, benefits, and alternatives of blood transfusions. The patient is symptomatic from anemia and the hemoglobin level is critically low.  Some of the side-effects to be expected including risks of transfusion reactions, chills, infection, syndrome of volume overload and risk of hospitalization from various reasons and the patient is willing to proceed and went ahead to sign consent today.  Sepsis secondary to possible left leg cellulitis IV antibiotics per hospitalist Lactic acid levels are being trended per sepsis protocol IV fluids per hospitalist Doppler ultrasound of bilateral lower extremities negative for DVT If the cellulitis continues to improve over the  weekend, she can be discharged with oral antibiotics  Hyponatremia Due to increased oral intake Sodium level improving with IV fluids Follow BMP closely  CKD (chronic kidney disease), stage III ([place]) She has intermittent elevated serum creatinine We will monitor carefully  Essential hypertension Current medications have been stopped secondary to hypotension Monitor blood pressure closely and resume when blood pressure stabilizes  Discharge planning Hopefully, she can be discharged over the weekend If she is still here on Monday, I will return to check on her If she is discharged, I will set up follow-up appointment to see her next week The plan of care is fully discussed with the patient and her daughter   INTERVAL HISTORY: The patient was admitted secondary to fever, left leg cellulitis, and altered mental status The patient's niece is at the bedside States that she started to develop low-grade fevers the day after her chemotherapy Fevers worsened yesterday up to 101 She also had confusion, generalized weakness, and decreased oral intake In the ER, she was noted to have redness in her left leg and pain Today, the patient's temperature is better with a T-max 99.1 overnight Left leg redness is about the same and she is not really complaining of any pain this morning Noted to be anemic with a hemoglobin of 6.7 on admission and received 1 unit of packed red blood cells overnight Denies bleeding Denies nausea vomiting  SUMMARY OF ONCOLOGIC HISTORY: Oncology History Overview Note  Neg genetics.  HRD test was done on peritoneal fluid from 07/18/2018: HRD +   Fallopian tube cancer, carcinoma ([place])  06/18/2016 Tumor Marker   Patient's tumor was tested for the following markers: CA-125 Results of the tumor marker test  revealed 298.   06/23/2016 PET scan   Diffuse hypermetabolic peritoneal carcinomatosis in the abdomen and pelvis, internal mammary adenopathy in the chest,  hypermetabolic right paracardiac/diaphragmatic LN, bilateral pleural effusion and moderate ascites   06/24/2016 Procedure   Therapeutic paracentesis   06/24/2016 Pathology Results   Positive for adenocarcinoma, [place]erian primary   06/28/2016 Procedure   She underwent CT guided biopsy of omentum   06/28/2016 Pathology Results   High grade serous involving the omentum, positive for p53, PAX8, WT1 and p16.   07/01/2016 Procedure   Findings/impression:   1. Sonographic evaluation of the right internal jugular vein confirms patency. Sonography was required to gain central venous access. An image documenting patency and needle access was saved.   2. Successful placement of right internal jugular vein subcutaneous Mediport as described. Spot film shows the catheter tip at the cavoatrial junction. No pneumothorax. The Mediport aspirates and flushes normally.   07/02/2016 - 08/12/2016 Chemotherapy   The patient had 3 cycles of carboplatin and taxol chemotherapy treatment.     08/29/2016 Imaging   Ct scan of chest, abdomen and pelvis showed marked improvement and resolution of thoracic adenopathy   09/08/2016 Surgery   She underwent interval debulking surgery with TAH/BSO and infracolic omentectomy by Dr. Wynelle Cleveland   09/08/2016 Pathology Results   Right tube: scant serous carcinoma. No viable tumor. Left tube and ovary: normal. Omentum: low volume serous carcinoma with few psamomma bodies.   10/07/2016 - 11/18/2016 Chemotherapy   The patient had 3 more cycles of carboplatin and taxol for chemotherapy treatment.     02/07/2017 Imaging   Impression:  1. Colonic diverticulosis without acute diverticulitis. 2. Stable calcification right kidney without hydronephrosis. 3. No acute process or significant interval change has occurred since August 29, 2016.   05/09/2018 Imaging   Impression:  Findings are most suspicious for acute sigmoid colonic diverticulitis with intraloop abscess  formation.   05/14/2018 Imaging   Impression: The pelvic fluid collection contiguous with sigmoid colonic diverticulitis changes has not significant change since May 09, 2018. No new fluid collections are seen   06/28/2018 Imaging   IMPRESSION: Changes consistent with diverticulitis with free fluid within the pelvis. No focal contained abscess is seen. A previously noted fluid collection deep within the pelvis has resolved in the interval.   Stable nonobstructing right renal stone.  The remainder of the exam is stable from the prior study.    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 There is omental caking as well as stranding within various areas of the peritoneal fat. There is also wall thickening of the sigmoid colon with adjacent stranding. This finding can also be related to peritoneal carcinomatosis and implants although an inflammatory process of the sigmoid colon is not excluded.   Right nephrolithiasis.  Ileus pattern.   07/16/2018 Imaging   Impression:   Findings of peritoneal carcinomatosis, rapidly progressive. Not present in July 2019, progressed when compared to September 13.  Multiple bowel loops are distorted but no distention or transition zone to suggest a component of active obstruction.   07/18/2018 Procedure   Findings/impression:  1. Sonographic evaluation of ascites. Sonography was required to gain access to ascites. An image documenting ascites was saved. 2. Successful ultrasound guided paracentesis using a temporary peritoneal drainage catheter inserted in the right upper quadrant as described yielding 600cc of ascites.  07/20/2018 Echocardiogram   General:  The patient was in normal sinus rhythm.  Left Ventricle:  Left ventricular ejection fraction was normal, estimated in the range of 60  to 65%. The left ventricular cavity size appears normal. The left ventricular wall thickness appears normal. The left ventricle is thickened in a fashion  consistent with mild concentric hypertrophy. Doppler tissue velocities and mitral inflow profile are consistent with Stage I diastolic dysfunction. The calculated ejection fraction is 68.1 %.  Right Ventricle:  The right ventricle appears normal in size and function.  Left Atrium:  The left atrium appears normal.  Right Atrium:  The right atrium appears normal.  Aortic Valve:  The structure of the aortic valve is tricuspid. There is evidence of trivial (trace) aortic regurgitation.  Mitral Valve:  There is no evidence of mitral regurgitation. The mitral valve appears normal in structure.  Pulmonic Valve:  There is evidence of trace (trivial) pulmonic regurgitation. The pulmonic valve appears normal in structure.  Tricuspid Valve:  There is evidence of trace (trivial) tricuspid regurgitation. The tricuspid valve appears normal in structure.  Pericardium:  There is no evidence of pericardial effusion.  Aorta:  The visualized portions of the aorta (ascending aorta, aortic root, and aortic arch) appear normal.  Pulmonic Artery:  Unable to obtain RVSP due to insufficient tricuspid regurgitation jet.  Conclusions: Left ventricular ejection fraction was normal, estimated in the range of 60 to 65%. The left ventricular cavity size appears normal. The left ventricular wall thickness appears normal. The left ventricle is thickened in a fashion consistent with mild concentric hypertrophy. Doppler tissue velocities and mitral inflow profile are consistent with Stage I diastolic dysfunction. The calculated ejection fraction is 68.1%.There is evidence of trivial (trace) aortic regurgitation.There is evidence of trace (trivial) pulmonic regurgitation.There is evidence of trace (trivial) tricuspid regurgitation.There is no evidence of pericardial  effusion.Unable to obtain RVSP due to insufficient tricuspid regurgitation jet.   07/23/2018 - 12/25/2018 Chemotherapy   The patient had carboplatin, doxil and Avastin for chemotherapy treatment x 6 cycles   08/01/2018 Imaging   Ct head showed no injuries.   08/01/2018 Procedure   Findings/impression:  1. Sonographic evaluation of ascites. Sonography was required to gain access to ascites. An image documenting ascites was saved. 2. Successful ultrasound guided paracentesis using a temporary peritoneal drainage catheter inserted in the right upper quadrant as described yielding 400cc of ascites.    08/06/2018 Tumor Marker   Patient's tumor was tested for the following markers: CA-125 Results of the tumor marker test revealed 373   08/20/2018 Tumor Marker   Patient's tumor was tested for the following markers: CA-125 Results of the tumor marker test revealed 51.   10/03/2018 Genetic Testing   Patient has genetic testing done for genetics testing using her saliva. 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  Liver and spleen homogeneously enhance. No lesion seen.  The adjacent gallbladder is surgically absent.  The adrenal glands, kidneys, pancreas are normal.  Pelvic contents are remarkable for an absent uterus.  The mesenteric stranding has nearly completely resolved. Minimal reticular prominence throughout the pelvis.  No residual ascites.  No bowel wall thickening or. No dilated loops or transition zone.  The appendix is not seen. But no inflammatory changes in the right lower quadrant.  Osseous structures are intact.   11/27/2018 Imaging   Ct scan abdomen and pelvis showed  colonic diverticulosis and nonspecific fluid levels in the bowel   12/24/2018 Cancer Staging   Staging form: Ovary, Fallopian Tube, and Primary Peritoneal Carcinoma, AJCC 8th Edition - Pathologic: Stage  IVB (rpT3a, pN0, pM1b) - Signed by Heath Lark, MD on 12/24/2018   12/31/2018 Tumor Marker   Patient's tumor was tested for the following markers: CA-125 Results of the tumor marker test revealed 14.4   01/01/2019 Imaging   1. LEFT jugular Port-A-Cath catheter tip projects over the mid SVC. 2. No acute cardiopulmonary disease.    01/04/2019 Echocardiogram   1. The left ventricle has normal systolic function with an ejection fraction of 60-65%. The cavity size was normal. Left ventricular diastolic Doppler parameters are consistent with impaired relaxation.  2. The right ventricle has normal systolic function. The cavity was normal. There is no increase in right ventricular wall thickness.  3. The mitral valve is normal in structure.  4. The tricuspid valve is normal in structure.  5. The aortic valve is normal in structure.  6. The pulmonic valve was normal in structure.  7. 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    02/13/2019 -  Chemotherapy   The patient is started on Lynparza due to [place]    04/22/2019 Tumor Marker   Patient's tumor was tested for the following markers: CA-125 Results of the tumor marker test revealed 44.8   04/26/2019 Imaging   1. No evidence of recurrent or metastatic carcinoma within the abdomen or pelvis. 2. Colonic diverticulosis, without radiographic evidence of diverticulitis or other acute findings.   Aortic Atherosclerosis (ICD10-I70.0).   05/28/2019 Tumor Marker   Patient's tumor was tested for the following markers: CA-125 Results of the tumor marker test revealed 104   07/05/2019 Imaging   1. Imaging findings consistent with recurrent peritoneal carcinomatosis. This is most apparent within the left abdomen involving the transverse colon and descending colon where there is increased soft tissue, loculated fluid and colonic serosal soft tissue thickening. 2. Increase in size of retroperitoneal and pelvic lymph nodes compatible with  metastatic adenopathy. 3. No evidence for bowel obstruction.     07/05/2019 Tumor Marker   Patient's tumor was tested for the following markers: CA-125 Results of the tumor marker test revealed 233   07/15/2019 -  Chemotherapy   The patient had gemcitabine (GEMZAR) 1,368 mg in sodium chloride 0.9 % 250 mL chemo infusion, 800 mg/m2 = 1,368 mg (80 % of original dose 1,000 mg/m2), Intravenous,  Once, 1 of 4 cycles Dose modification: 800 mg/m2 (80 % of original dose 1,000 mg/m2, Cycle 1, Reason: Patient Age), 600 mg/m2 (60 % of original dose 1,000 mg/m2, Cycle 1, Reason: Dose Not Tolerated) Administration: 1,368 mg (07/15/2019), 1,026 mg (07/29/2019)  for chemotherapy treatment.      REVIEW OF SYSTEMS: She continues to complain of profound fatigue Eyes: Denies blurriness of vision Ears, nose, mouth, throat, and face: Denies mucositis or sore throat Respiratory: Denies cough, dyspnea or wheezes Cardiovascular: Denies palpitation, chest discomfort Skin: Reports redness to her left leg Lymphatics: Denies new lymphadenopathy or easy bruising Behavioral/Psych: Mood is stable, no new changes  All other systems were reviewed with the patient and are negative.  I have reviewed the past medical history, past surgical history, social history and family history with the patient and they are unchanged from previous note.  ALLERGIES:  is allergic to latex.  MEDICATIONS:  Current Facility-Administered Medications  Medication Dose Route Frequency Provider Last Rate Last Dose  .  0.9 %  sodium chloride infusion (Manually program via Guardrails IV Fluids)   Intravenous Once Blanchie Dessert, MD      . 0.9 %  sodium chloride infusion   Intravenous Continuous Ivor Costa, MD   Stopped at 08/02/19 [phone]  . acetaminophen (TYLENOL) tablet 650 mg  650 mg Oral Q6H PRN Ivor Costa, MD   650 mg at 08/02/19 0175   Or  . acetaminophen (TYLENOL) suppository 650 mg  650 mg Rectal Q6H PRN Ivor Costa, MD      .  Chlorhexidine Gluconate Cloth 2 % PADS 6 each  6 each Topical Daily Ivor Costa, MD      . cholecalciferol (VITAMIN D3) tablet 1,000 Units  1,000 Units Oral Daily Ivor Costa, MD   1,000 Units at 08/02/19 1025  . hydrALAZINE (APRESOLINE) tablet 25 mg  25 mg Oral TID PRN Ivor Costa, MD      . Derrill Memo ON 08/03/2019] influenza vaccine adjuvanted (FLUAD) injection 0.5 mL  0.5 mL Intramuscular Tomorrow-1000 Ivor Costa, MD      . levothyroxine (SYNTHROID) tablet 50 mcg  50 mcg Oral QAC breakfast Ivor Costa, MD   50 mcg at 08/02/19 [phone]  . morphine (MS CONTIN) 12 hr tablet 15 mg  15 mg Oral Q12H Ivor Costa, MD   15 mg at 08/02/19 0855  . multivitamin with minerals tablet 1 tablet  1 tablet Oral Daily Ivor Costa, MD   1 tablet at 08/02/19 0855  . ondansetron (ZOFRAN) tablet 4 mg  4 mg Oral Q6H PRN Ivor Costa, MD       Or  . ondansetron [facility]) injection 4 mg  4 mg Intravenous Q6H PRN Ivor Costa, MD      . oxyCODONE-acetaminophen (PERCOCET/ROXICET) 5-325 MG per tablet 1 tablet  1 tablet Oral Q4H PRN Ivor Costa, MD   1 tablet at 08/02/19 0901  . polyethylene glycol (MIRALAX / GLYCOLAX) packet 17 g  17 g Oral Daily PRN Ivor Costa, MD      . sodium chloride flush (NS) 0.9 % injection 10-40 mL  10-40 mL Intracatheter PRN Ivor Costa, MD   10 mL at 08/02/19 0825  . vancomycin (VANCOCIN) IVPB 750 mg/150 ml premix  750 mg Intravenous Daily Dorrene German, RPH 150 mL/hr at 08/02/19 0921 750 mg at 08/02/19 7824    PHYSICAL EXAMINATION: ECOG PERFORMANCE STATUS: 1 - Symptomatic but completely ambulatory  Vitals:   08/02/19 0905 08/02/19 0953  BP:    Pulse:    Resp:    Temp:  99 F (37.2 C)  SpO2: 91%    Filed Weights   08/02/19 0326  Weight: 158 lb 4.8 oz (71.8 kg)    GENERAL:alert, no distress and comfortable.  She looks a bit pale SKIN: Erythema in the upper medial area of the leg, mild erythema to the left lower leg EYES: normal, Conjunctiva are pink and non-injected, sclera clear OROPHARYNX:no  exudate, no erythema and lips, buccal mucosa, and tongue normal  NECK: supple, thyroid normal size, non-tender, without nodularity LYMPH:  no palpable lymphadenopathy in the cervical, axillary or inguinal LUNGS: clear to auscultation and percussion with normal breathing effort HEART: regular rate & rhythm and no murmurs and no lower extremity edema ABDOMEN:abdomen soft, non-tender and normal bowel sounds Musculoskeletal:no cyanosis of digits and no clubbing  NEURO: alert & oriented x 3 with fluent speech, no focal motor/sensory deficits  LABORATORY DATA:  I have reviewed the data as listed    Component Value Date/Time  NA 133 (L) 08/02/2019 0825   NA 139 06/27/2018 1649   K 4.1 08/02/2019 0825   CL 104 08/02/2019 0825   CO2 19 (L) 08/02/2019 0825   GLUCOSE 106 (H) 08/02/2019 0825   BUN 21 08/02/2019 0825   BUN 17 06/27/2018 1649   CREATININE 0.91 08/02/2019 0825   CALCIUM 8.2 (L) 08/02/2019 0825   PROT 6.2 (L) 08/01/2019 1834   PROT 6.8 05/24/2018 0851   ALBUMIN 2.8 (L) 08/01/2019 1834   ALBUMIN 4.2 05/24/2018 0851   AST 31 08/01/2019 1834   ALT 26 08/01/2019 1834   ALKPHOS 79 08/01/2019 1834   BILITOT 0.5 08/01/2019 1834   BILITOT 0.2 05/24/2018 0851   GFRNONAA 59 (L) 08/02/2019 0825   GFRAA >60 08/02/2019 0825    No results found for: SPEP, UPEP  Lab Results  Component Value Date   WBC 7.2 08/02/2019   NEUTROABS 8.7 (H) 08/01/2019   HGB 7.6 (L) 08/02/2019   HCT 22.9 (L) 08/02/2019   MCV 108.0 (H) 08/02/2019   PLT 95 (L) 08/02/2019      Chemistry      Component Value Date/Time   NA 133 (L) 08/02/2019 0825   NA 139 06/27/2018 1649   K 4.1 08/02/2019 0825   CL 104 08/02/2019 0825   CO2 19 (L) 08/02/2019 0825   BUN 21 08/02/2019 0825   BUN 17 06/27/2018 1649   CREATININE 0.91 08/02/2019 0825      Component Value Date/Time   CALCIUM 8.2 (L) 08/02/2019 0825   ALKPHOS 79 08/01/2019 1834   AST 31 08/01/2019 1834   ALT 26 08/01/2019 1834   BILITOT 0.5  08/01/2019 1834   BILITOT 0.2 05/24/2018 0851       RADIOGRAPHIC STUDIES: I have personally reviewed the radiological images as listed and agreed with the findings in the report. Ct Abdomen Pelvis W Contrast  Result Date: 07/05/2019 CLINICAL DATA:  Epithelial ovarian/fallopian tube/primary peritoneal carcinoma. EXAM: CT ABDOMEN AND PELVIS WITH CONTRAST TECHNIQUE: Multidetector CT imaging of the abdomen and pelvis was performed using the standard protocol following bolus administration of intravenous contrast. CONTRAST:  169m OMNIPAQUE IOHEXOL 300 MG/ML  SOLN COMPARISON:  04/26/2019 FINDINGS: Lower chest: 4 mm left lower lobe lung nodule noted, unchanged. Hepatobiliary: There is no focal liver abnormality identified. Previous cholecystectomy. Common bile duct is upper limits of normal in caliber measuring 6 mm. Pancreas: Unremarkable. No pancreatic ductal dilatation or surrounding inflammatory changes. Spleen: Normal in size without focal abnormality. Adrenals/Urinary Tract: Normal appearance of the adrenal glands. Small, 3 mm right lower pole kidney stone. No kidney mass or hydronephrosis identified at this time. The urinary bladder appears normal. Stomach/Bowel: Stomach is normal. No bowel wall inflammation or distension. Distal colonic diverticulosis identified without acute inflammation. New serosal thickening along the posterior wall of the transverse colon, image 46/2. Vascular/Lymphatic: Aortic atherosclerosis. No aneurysm. Increase in size of retroperitoneal lymph nodes. Left periaortic node the up measures 0.8 cm, image 32/2 new from previous exam. Left retroperitoneal lymph node measures 0.7 cm, image 35/2 previously 0.3 cm. 1 cm left common iliac node noted, image 52/2. Previously 0.6 cm. 1.1 cm left external iliac node identified. Previously 0.9 cm. Reproductive: Status post hysterectomy. No adnexal masses. Other: Peritoneal carcinomatosis is identified. Most notable is in the left lower  quadrant of the abdomen surrounding the distal transverse colon. Here, there is an area of soft tissue infiltration measuring 5.1 x 2.2 cm with adjacent serosal involvement of the colon, image 48/2. Anterior to  the is a cystic lesion along the undersurface of the ventral abdominal wall which measures 3.3 x 2.1 cm, image 48/2. Peritoneal thickening and nodularity along the left pericolic gutter is new, image 42/5. Adjacent increased serosal soft tissue is noted along the lateral aspect of the descending colon, image 50/5. Within the left upper quadrant of the abdomen there is an area of peritoneal soft tissue adjacent to the distal transverse colon which measures 3.8 x 2.5 cm, image 38/5. Lymph node or peritoneal nodule is identified in the left lower quadrant of the abdomen measuring 1.0 x 1.6 cm, image 30/5. Previously 0.7 x 1.4 cm. Musculoskeletal: Scoliosis and degenerative disc disease identified within the lumbar spine. IMPRESSION: 1. Imaging findings consistent with recurrent peritoneal carcinomatosis. This is most apparent within the left abdomen involving the transverse colon and descending colon where there is increased soft tissue, loculated fluid and colonic serosal soft tissue thickening. 2. Increase in size of retroperitoneal and pelvic lymph nodes compatible with metastatic adenopathy. 3. No evidence for bowel obstruction. Electronically Signed   By: Kerby Moors M.D.   On: 07/05/2019 11:55   Dg Chest Port 1 View  Result Date: 08/01/2019 CLINICAL DATA:  Daughter states that patient had fever and confusion since yesterday. Had cancer treatment on Monday. PT HX: non smoker EXAM: PORTABLE CHEST 1 VIEW COMPARISON:  07/17/2019 FINDINGS: Cardiac silhouette normal in size and configuration. No mediastinal or hilar masses or convincing adenopathy. Clear lungs.  No pleural effusion or pneumothorax. Stable right anterior chest wall Port-A-Cath. Skeletal structures are demineralized but grossly intact.  IMPRESSION: No acute cardiopulmonary disease. Electronically Signed   By: Lajean Manes M.D.   On: 08/01/2019 19:23   Dg Chest Port 1 View  Result Date: 07/17/2019 CLINICAL DATA:  Hypoxemia, confusion and fatigue. Undergoing chemotherapy for GYN malignancy. EXAM: PORTABLE CHEST 1 VIEW COMPARISON:  Radiographs 12/31/2018.  Abdominal CT 07/05/2019. FINDINGS: 1141 hours. There are lower lung volumes. Right IJ Port-A-Cath tip is unchanged at the lower SVC level. The heart size and mediastinal contours are stable for the lesser degree of inspiration. There is mild bibasilar atelectasis, vascular congestion and possible mild edema. No confluent airspace opacity, significant pleural effusion or pneumothorax. The bones appear unchanged. IMPRESSION: Lower lung volumes with bibasilar atelectasis, vascular congestion and possible mild pulmonary edema. No confluent airspace opacity. Electronically Signed   By: Richardean Sale M.D.   On: 07/17/2019 11:58   Vas [place] Lower Extremity Venous (dvt)  Result Date: 08/02/2019  Lower Venous Study Indications: Cellulitis.  Risk Factors: None identified. Comparison Study: No prior studies. Performing Technologist: Oliver Hum RVT  Examination Guidelines: A complete evaluation includes B-mode imaging, spectral Doppler, color Doppler, and power Doppler as needed of all accessible portions of each vessel. Bilateral testing is considered an integral part of a complete examination. Limited examinations for reoccurring indications may be performed as noted.  +-----+---------------+---------+-----------+----------+--------------+ RIGHTCompressibilityPhasicitySpontaneityPropertiesThrombus Aging +-----+---------------+---------+-----------+----------+--------------+ CFV  Full           Yes      Yes                                 +-----+---------------+---------+-----------+----------+--------------+    +---------+---------------+---------+-----------+----------+--------------+ LEFT     CompressibilityPhasicitySpontaneityPropertiesThrombus Aging +---------+---------------+---------+-----------+----------+--------------+ CFV      Full           Yes      Yes                                 +---------+---------------+---------+-----------+----------+--------------+  SFJ      Full                                                        +---------+---------------+---------+-----------+----------+--------------+ FV Prox  Full                                                        +---------+---------------+---------+-----------+----------+--------------+ FV Mid   Full                                                        +---------+---------------+---------+-----------+----------+--------------+ FV DistalFull                                                        +---------+---------------+---------+-----------+----------+--------------+ PFV      Full                                                        +---------+---------------+---------+-----------+----------+--------------+ POP      Full           Yes      Yes                                 +---------+---------------+---------+-----------+----------+--------------+ PTV      Full                                                        +---------+---------------+---------+-----------+----------+--------------+ PERO     Full                                                        +---------+---------------+---------+-----------+----------+--------------+     Summary: Right: No evidence of common femoral vein obstruction. Left: There is no evidence of deep vein thrombosis in the lower extremity. No cystic structure found in the popliteal fossa.  *See table(s) above for measurements and observations.    Preliminary     All questions were answered. The patient knows to call the clinic with any  problems, questions or concerns. No barriers to learning was detected.  Mikey Bussing, NP 08/02/2019 10:56 AM Heath Lark, MD

## 2019-08-02 NOTE — Telephone Encounter (Signed)
I will see her in the hospital today and will remind family not to send mychart messages for urgent issues

## 2019-08-02 NOTE — Progress Notes (Signed)
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  Took a second dose of Gemzar at reduced dose 5 days ago.  Reportedly had fever and confusion after first dose.   In ED, hemodynamically stable with good saturation.  Hgb 6.7 (8.1 on 9/28).  COVID-19, lactic acid and CXR negative.  Sodium 129.  Started on vancomycin and cefepime in ED.  Admitted for sepsis due to possible left lower extremity cellulitis on vancomycin.  Subjective: No major events overnight of this morning.  No complaint this morning.  Denies chest pain, dyspnea, GI or GU symptoms.  Denies left lower extremity pain.  Daughter at bedside interpreting.  Objective: Vitals:   08/02/19 0328 08/02/19 0610 08/02/19 0905 08/02/19 0953  BP: (!) 101/44 (!) 108/59    Pulse: 66 72    Resp:      Temp: 99.1 F (37.3 C) 99.1 F (37.3 C)  99 F (37.2 C)  TempSrc: Axillary Oral  Oral  SpO2: 95% 98% 91%   Weight:      Height:        Intake/Output Summary (Last 24 hours) at 08/02/2019 1357 Last data filed at 08/02/2019 1030 Gross per 24 hour  Intake 464 ml  Output --  Net 464 ml   Filed Weights   08/02/19 0326  Weight: 71.8 kg    Examination:  GENERAL: No acute distress.  Appears well.  HEENT: MMM.  Vision and hearing grossly intact.  NECK: Supple.  No apparent JVD.  RESP:  No IWOB. Good air movement bilaterally. CVS:  RRR. Heart sounds normal.  ABD/GI/GU: Bowel sounds present. Soft. Non tender.  MSK/EXT:  Moves extremities. No apparent deformity or edema.  SKIN: Erythema over the medial aspect of left thigh and anterior aspect  of left shin.  No increased warmth to touch.  No tenderness to palpation. NEURO: Awake, alert and oriented appropriately.  No gross deficit.  PSYCH: Calm. Normal affect.   Assessment & Plan: Sepsis/febrile illness: Left lower extremity erythema, SIRS and acute encephalopathy.  Concern about left lower extremity cellulitis.  A presentation could also be due to chemotherapy.  Blood cultures negative.  Procalcitonin elevated but unreliable in patient malignancy on chemo. -We will continue IV vancomycin until blood cultures negative x24 hours. -We will de-escalate to oral antibiotics if cultures remain negative.  Acute metabolic encephalopathy: Likely due to the above.  She is also on morphine which could contribute.  Improving. -Continue home morphine at reduced dose. -Frequent reorientation and delirium precaution -PT/OT eval  Fallopian tube cancer, carcinoma -Per oncology.  Pancytopenia: Hgb lower than baseline at 6.7.  Mild thrombocytopenia.  Likely due to chemo. -Agree with transfusion -Hematology recommends keeping Hgb above 8.  Hyponatremia: Likely due to thiazide diuretics. -Hold home thiazide -Continue IV normal saline  CKD-3: Stable  Essential hypertension: Normotensive but soft. -We will resume home amlodipine and losartan as able.  DVT prophylaxis: SCD in the setting of thrombocytopenia Code Status: DNR/DNI Family Communication: Updated patient's daughter at bedside. Disposition Plan: Remains inpatient Consultants: Hematology/oncology  Procedures:  None  Microbiology summarized: [id number] negative. Blood cultures negative so far.  Antimicrobials: Anti-infectives (From admission, onward)  Start     Dose/Rate Route Frequency Ordered Stop   08/02/19 1000  vancomycin (VANCOCIN) IVPB 750 mg/150 ml premix     750 mg 150 mL/hr over 60 Minutes Intravenous Daily 08/01/19 2223     08/01/19 1915  ceFEPIme (MAXIPIME) 2 g in sodium chloride 0.9 % 100 mL IVPB     2 g 200  mL/hr over 30 Minutes Intravenous  Once 08/01/19 1905 08/01/19 2006   08/01/19 1915  vancomycin (VANCOCIN) IVPB 1000 mg/200 mL premix     1,000 mg 200 mL/hr over 60 Minutes Intravenous  Once 08/01/19 1905 08/01/19 2243      Sch Meds:  Scheduled Meds:  sodium chloride   Intravenous Once   sodium chloride   Intravenous Once   acetaminophen  650 mg Oral Once   Chlorhexidine Gluconate Cloth  6 each Topical Daily   cholecalciferol  1,000 Units Oral Daily   diphenhydrAMINE  25 mg Oral Once   [START ON 08/03/2019] influenza vaccine adjuvanted  0.5 mL Intramuscular Tomorrow-1000   levothyroxine  50 mcg Oral QAC breakfast   morphine  15 mg Oral Q12H   multivitamin with minerals  1 tablet Oral Daily   Continuous Infusions:  sodium chloride Stopped (08/02/19 0338)   vancomycin 750 mg (08/02/19 0921)   PRN Meds:.acetaminophen **OR** acetaminophen, hydrALAZINE, ondansetron **OR** ondansetron (ZOFRAN) IV, oxyCODONE-acetaminophen, polyethylene glycol, sodium chloride flush   I have personally reviewed the following labs and images: CBC: Recent Labs  Lab 07/29/19 1011 08/01/19 1834 08/02/19 0825  WBC 4.9 9.4 7.2  NEUTROABS 2.9 8.7*  --   HGB 8.1* 6.7* 7.6*  HCT 24.5* 20.7* 22.9*  MCV 112.9* 114.4* 108.0*  PLT 100* 105* 95*   BMP &GFR Recent Labs  Lab 07/29/19 1011 08/01/19 1834 08/02/19 0825  NA 137 129* 133*  K 4.1 4.3 4.1  CL 103 100 104  CO2 25 19* 19*  GLUCOSE 106* 125* 106*  BUN 31* 28* 21  CREATININE 1.46* 1.18* 0.91  CALCIUM 9.0 8.2* 8.2*   Estimated Creatinine Clearance: 46.3 mL/min (by C-G formula based on SCr of 0.91 mg/dL). Liver & Pancreas: Recent Labs  Lab 07/29/19 1011 08/01/19 1834  AST 19 31  ALT 18 26  ALKPHOS 90 79  BILITOT 0.3 0.5  PROT 6.8 6.2*  ALBUMIN 3.3* 2.8*   No results for input(s): LIPASE, AMYLASE in the last 168 hours. No results for input(s): AMMONIA in the last 168 hours. Diabetic: No results for input(s): HGBA1C in the  last 72 hours. No results for input(s): GLUCAP in the last 168 hours. Cardiac Enzymes: No results for input(s): CKTOTAL, CKMB, CKMBINDEX, TROPONINI in the last 168 hours. No results for input(s): PROBNP in the last 8760 hours. Coagulation Profile: Recent Labs  Lab 08/01/19 1834  INR 1.1   Thyroid Function Tests: No results for input(s): TSH, T4TOTAL, FREET4, T3FREE, THYROIDAB in the last 72 hours. Lipid Profile: No results for input(s): CHOL, HDL, LDLCALC, TRIG, CHOLHDL, LDLDIRECT in the last 72 hours. Anemia Panel: No results for input(s): VITAMINB12, FOLATE, FERRITIN, TIBC, IRON, RETICCTPCT in the last 72 hours. Urine analysis:    Component Value Date/Time   COLORURINE YELLOW 08/01/2019 1834   APPEARANCEUR CLEAR 08/01/2019 1834   LABSPEC 1.012 08/01/2019 1834   PHURINE 5.0 08/01/2019 1834   GLUCOSEU NEGATIVE 08/01/2019 1834   HGBUR NEGATIVE 08/01/2019 1834   BILIRUBINUR NEGATIVE 08/01/2019 1834   BILIRUBINUR negative 07/13/2018 [place] 08/01/2019 1834   PROTEINUR NEGATIVE 08/01/2019 1834  UROBILINOGEN 0.2 07/13/2018 1210   NITRITE NEGATIVE 08/01/2019 1834   LEUKOCYTESUR NEGATIVE 08/01/2019 1834   Sepsis Labs: Invalid input(s): PROCALCITONIN, [place]  Microbiology: Recent Results (from the past 240 hour(s))  Blood Culture (routine x 2)     Status: None (Preliminary result)   Collection Time: 08/01/19  6:00 PM   Specimen: BLOOD  Result Value Ref Range Status   Specimen Description   Final    BLOOD LEFT ANTECUBITAL Performed at [facility] [address], [place], [place] [address]    Special Requests   Final    BOTTLES DRAWN AEROBIC AND ANAEROBIC Blood Culture adequate volume Performed at [facility] [address], [place], [place] [address]    Culture   Final    NO GROWTH < 12 HOURS Performed at [facility] [address], [place], [place] [address]    Report Status PENDING  Incomplete    Blood Culture (routine x 2)     Status: None (Preliminary result)   Collection Time: 08/01/19  6:34 PM   Specimen: BLOOD  Result Value Ref Range Status   Specimen Description   Final    BLOOD PORTA CATH Performed at [facility] [address] [place], [place] [address]    Special Requests   Final    BOTTLES DRAWN AEROBIC AND ANAEROBIC Blood Culture adequate volume Performed at [facility] [address] [place], [place] [address]    Culture   Final    NO GROWTH < 12 HOURS Performed at [facility] [address] [place], [place] [address]    Report Status PENDING  Incomplete  SARS Coronavirus 2 [facility] order, Performed in [facility] hospital lab) Nasopharyngeal Nasopharyngeal Swab     Status: None   Collection Time: 08/01/19  9:49 PM   Specimen: Nasopharyngeal Swab  Result Value Ref Range Status   SARS Coronavirus 2 NEGATIVE NEGATIVE Final    Comment: (NOTE) If result is NEGATIVE SARS-CoV-2 target nucleic acids are NOT DETECTED. The SARS-CoV-2 RNA is generally detectable in upper and lower  respiratory specimens during the acute phase of infection. The lowest  concentration of SARS-CoV-2 viral copies this assay can detect is 250  copies / mL. A negative result does not preclude SARS-CoV-2 infection  and should not be used as the sole basis for treatment or other  patient management decisions.  A negative result may occur with  improper specimen collection / handling, submission of specimen other  than nasopharyngeal swab, presence of viral mutation(s) within the  areas targeted by this assay, and inadequate number of viral copies  (<250 copies / mL). A negative result must be combined with clinical  observations, patient history, and epidemiological information. If result is POSITIVE SARS-CoV-2 target nucleic acids are DETECTED. The SARS-CoV-2 RNA is generally detectable in upper and lower  respiratory specimens dur ing  the acute phase of infection.  Positive  results are indicative of active infection with SARS-CoV-2.  Clinical  correlation with patient history and other diagnostic information is  necessary to determine patient infection status.  Positive results do  not rule out bacterial infection or co-infection with other viruses. If result is PRESUMPTIVE POSTIVE SARS-CoV-2 nucleic acids MAY BE PRESENT.   A presumptive positive result was obtained on the submitted specimen  and confirmed on repeat testing.  While 2019 novel coronavirus  (SARS-CoV-2) nucleic acids may be present in the submitted sample  additional confirmatory testing  may be necessary for epidemiological  and / or clinical management purposes  to differentiate between  SARS-CoV-2 and other Sarbecovirus currently known to infect humans.  If clinically indicated additional testing with an alternate test  methodology [phone]) is advised. The SARS-CoV-2 RNA is generally  detectable in upper and lower respiratory sp ecimens during the acute  phase of infection. The expected result is Negative. Fact Sheet for Patients:  StrictlyIdeas.no Fact Sheet for Healthcare Providers: BankingDealers.co.za This test is not yet approved or cleared by the [place] FDA and has been authorized for detection and/or diagnosis of SARS-CoV-2 by FDA under an Emergency Use Authorization (EUA).  This EUA will remain in effect (meaning this test can be used) for the duration of the COVID-19 declaration under Section 564(b)(1) of the Act, 21 U.S.C. section 360bbb-3(b)(1), unless the authorization is terminated or revoked sooner. Performed at [facility], [place] [address], [place], [place] [address]     Radiology Studies: Dg Chest Port 1 View  Result Date: 08/01/2019 CLINICAL DATA:  Daughter states that patient had fever and confusion since yesterday. Had cancer treatment on Monday. PT  HX: non smoker EXAM: PORTABLE CHEST 1 VIEW COMPARISON:  07/17/2019 FINDINGS: Cardiac silhouette normal in size and configuration. No mediastinal or hilar masses or convincing adenopathy. Clear lungs.  No pleural effusion or pneumothorax. Stable right anterior chest wall Port-A-Cath. Skeletal structures are demineralized but grossly intact. IMPRESSION: No acute cardiopulmonary disease. Electronically Signed   By: Lajean Manes M.D.   On: 08/01/2019 19:23   Vas [place] Lower Extremity Venous (dvt)  Result Date: 08/02/2019  Lower Venous Study Indications: Cellulitis.  Risk Factors: None identified. Comparison Study: No prior studies. Performing Technologist: Oliver Hum RVT  Examination Guidelines: A complete evaluation includes B-mode imaging, spectral Doppler, color Doppler, and power Doppler as needed of all accessible portions of each vessel. Bilateral testing is considered an integral part of a complete examination. Limited examinations for reoccurring indications may be performed as noted.  +-----+---------------+---------+-----------+----------+--------------+  RIGHT Compressibility Phasicity Spontaneity Properties Thrombus Aging  +-----+---------------+---------+-----------+----------+--------------+  CFV   Full            Yes       Yes                                    +-----+---------------+---------+-----------+----------+--------------+   +---------+---------------+---------+-----------+----------+--------------+  LEFT      Compressibility Phasicity Spontaneity Properties Thrombus Aging  +---------+---------------+---------+-----------+----------+--------------+  CFV       Full            Yes       Yes                                    +---------+---------------+---------+-----------+----------+--------------+  SFJ       Full                                                             +---------+---------------+---------+-----------+----------+--------------+  FV Prox   Full                                                              +---------+---------------+---------+-----------+----------+--------------+  FV Mid    Full                                                             +---------+---------------+---------+-----------+----------+--------------+  FV Distal Full                                                             +---------+---------------+---------+-----------+----------+--------------+  PFV       Full                                                             +---------+---------------+---------+-----------+----------+--------------+  POP       Full            Yes       Yes                                    +---------+---------------+---------+-----------+----------+--------------+  PTV       Full                                                             +---------+---------------+---------+-----------+----------+--------------+  PERO      Full                                                             +---------+---------------+---------+-----------+----------+--------------+     Summary: Right: No evidence of common femoral vein obstruction. Left: There is no evidence of deep vein thrombosis in the lower extremity. No cystic structure found in the popliteal fossa.  *See table(s) above for measurements and observations.    Preliminary      35 minutes with more than 50% spent in reviewing records, counseling patient and coordinating care.  Alanmichael Barmore T. [place]  If 7PM-7AM, please contact night-coverage [email or web address] Password [email or web address] 08/02/2019, 1:57 PM

## 2019-08-03 LAB — BPAM RBC
Blood Product Expiration Date: 202010222359
Blood Product Expiration Date: 202010242359
ISSUE DATE / TIME: 202010020308
ISSUE DATE / TIME: 202010021526
Unit Type and Rh: 6200
Unit Type and Rh: 6200

## 2019-08-03 LAB — BASIC METABOLIC PANEL
Anion gap: 6 (ref 5–15)
BUN: 18 mg/dL (ref 8–23)
CO2: 21 mmol/L — ABNORMAL LOW (ref 22–32)
Calcium: 8.3 mg/dL — ABNORMAL LOW (ref 8.9–10.3)
Chloride: 109 mmol/L (ref 98–111)
Creatinine, Ser: 0.81 mg/dL (ref 0.44–1.00)
GFR calc Af Amer: >60 mL/min (ref >60)
GFR calc non Af Amer: >60 mL/min (ref >60)
Glucose, Bld: 134 mg/dL — ABNORMAL HIGH (ref 70–99)
Potassium: 3.9 mmol/L (ref 3.5–5.1)
Sodium: 136 mmol/L (ref 135–145)

## 2019-08-03 LAB — CBC
HCT: 25 % — ABNORMAL LOW (ref 36.0–46.0)
Hemoglobin: 8.1 g/dL — ABNORMAL LOW (ref 12.0–15.0)
MCH: 34.9 pg — ABNORMAL HIGH (ref 26.0–34.0)
MCHC: 32.4 g/dL (ref 30.0–36.0)
MCV: 107.8 fL — ABNORMAL HIGH (ref 80.0–100.0)
Platelets: 74 10*3/uL — ABNORMAL LOW (ref 150–400)
RBC: 2.32 MIL/uL — ABNORMAL LOW (ref 3.87–5.11)
RDW: 18.1 % — ABNORMAL HIGH (ref 11.5–15.5)
WBC: 4.1 10*3/uL (ref 4.0–10.5)
nRBC: 0 % (ref 0.0–0.2)

## 2019-08-03 LAB — TYPE AND SCREEN
ABO/RH(D): A POS
Antibody Screen: NEGATIVE
Unit division: 0
Unit division: 0

## 2019-08-03 LAB — MAGNESIUM: Magnesium: 1.6 mg/dL — ABNORMAL LOW (ref 1.7–2.4)

## 2019-08-03 MED ORDER — HEPARIN SOD (PORK) LOCK FLUSH 100 UNIT/ML IV SOLN
500.0000 [IU] | INTRAVENOUS | Status: AC | PRN
  Administered 2019-08-03: 15:00:00 500 [IU]

## 2019-08-03 MED ORDER — DOXYCYCLINE HYCLATE 100 MG PO TABS: 100.0000 mg | ORAL_TABLET | Freq: Two times a day (BID) | ORAL | 0 refills | Status: DC

## 2019-08-03 MED ORDER — MORPHINE SULFATE ER 15 MG PO TBCR
15.0000 mg | EXTENDED_RELEASE_TABLET | Freq: Once | ORAL | Status: AC
  Administered 2019-08-03: 15 mg via ORAL
  Filled 2019-08-03: qty 1

## 2019-08-03 MED ORDER — MORPHINE SULFATE ER 30 MG PO TBCR: 30.0000 mg | EXTENDED_RELEASE_TABLET | Freq: Two times a day (BID) | ORAL | Status: DC

## 2019-08-03 MED ORDER — MAGNESIUM SULFATE 2 GM/50ML IV SOLN
2.0000 g | Freq: Once | INTRAVENOUS | Status: AC
  Administered 2019-08-03: 2 g via INTRAVENOUS
  Filled 2019-08-03: qty 50

## 2019-08-03 MED ORDER — ACETAMINOPHEN 325 MG PO TABS
325.0000 mg | ORAL_TABLET | Freq: Once | ORAL | Status: AC
  Administered 2019-08-03: 325 mg via ORAL
  Filled 2019-08-03: qty 1

## 2019-08-03 MED ORDER — VANCOMYCIN HCL IN DEXTROSE 1-5 GM/200ML-% IV SOLN
1000.0000 mg | Freq: Every day | INTRAVENOUS | Status: DC
  Administered 2019-08-03: 1000 mg via INTRAVENOUS
  Filled 2019-08-03: qty 200

## 2019-08-03 MED ORDER — IPRATROPIUM-ALBUTEROL 0.5-2.5 (3) MG/3ML IN SOLN: 3.0000 mL | RESPIRATORY_TRACT | Status: DC | PRN

## 2019-08-03 NOTE — Progress Notes (Signed)
OT Cancellation Note  Patient Details Name: Lisa Moyer MRN: [id number] DOB: January 22, 1935   Cancelled Treatment:    Reason Eval/Treat Not Completed: Other (comment).  Spoke to pt/daughter. Daughter would like OT to work with pt this am, but pt wants to rest a little first. Will return.  Also, daughter would like a 3:1 commode for home.  Tailey Top 08/03/2019, 10:14 AM  Lesle Chris, OTR/L Acute Rehabilitation Services [phone] [facility] pager [phone] office 08/03/2019

## 2019-08-03 NOTE — Discharge Summary (Signed)
Physician Discharge Summary  Lisa Moyer [id number] DOB: 1935/05/30 DOA: 08/01/2019  PCP: Patient, No Pcp Per  Admit date: 08/01/2019 Discharge date: 08/03/2019  Admitted From: Home Disposition: Home  Recommendations for Outpatient Follow-up:  1. Follow up with PCP/oncology in 1-2 weeks 2. Please obtain CBC/BMP/Mag at follow up 3. 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  Took a second dose of Gemzar at reduced dose 5 days ago.  Reportedly had fever and confusion after first dose.   In ED, hemodynamically stable with good saturation.  Hgb 6.7 (8.1 on 9/28).  COVID-19, lactic acid and CXR negative.  Sodium 129.  Started on vancomycin and cefepime in ED.  Admitted for sepsis due to possible left lower extremity cellulitis on vancomycin.  Patient was continued on vancomycin.  Transfused 2 units of blood with appropriate response.  Mental status and left lower extremity erythema improved as well.  Blood cultures remain negative for 2 days.  Discharged home on doxycycline for 8 more days to complete treatment course.  Patient was evaluated by his oncologist while in the hospital.  She was also evaluated by PT/OT and no need was identified.  See individual problem list below for more hospital course.  Discharge Diagnoses:  Sepsis/febrile illness: Left lower extremity erythema, SIRS and acute encephalopathy.  Concern about left lower extremity cellulitis.  A presentation could also be due to chemotherapy.  Blood cultures negative.  Procalcitonin elevated but unreliable  in patient malignancy on chemo.  Cultures remain negative.  Discharged on oral doxycycline for 8 more days to complete a total of 10 days course of antibiotics. Patient to follow-up with her PCP/oncologist in 1 to 2 weeks.  Acute metabolic encephalopathy: Likely due to the above.  She is also on opiates which could contribute.  Mental status improved significantly on the day of discharge.   Fallopian tube cancer, carcinoma -Per oncology.  Pancytopenia: Hgb lower than baseline at 6.7.  Mild thrombocytopenia.  Likely due to chemo. -Hemoglobin improved to 8.1 on the day of discharge. -Recheck CBC at follow-up.  Hyponatremia: Likely due to thiazide diuretics.  Resolved.  Thiazide discontinued. -Recheck BMP at follow-up.  AKI versus CKD-3?:  Creatinine normalized after holding diuretics favoring AKI versus CKD 3.  Essential hypertension: Normotensive -Discharged on home medications -Discontinued HCTZ  Chronic pain due to cancer. -Discharged on home morphine.  Patient voices understanding the risk and benefit but would like to continue home dose  Hypomagnesemia: Replenished prior to discharge.  Discharge Instructions  Discharge Instructions    Call MD for:  difficulty breathing, headache or visual disturbances   Complete by: As directed    Call MD for:  persistant nausea and vomiting   Complete by: As directed    Call MD for:  redness, tenderness, or signs of infection (pain, swelling, redness, odor or green/yellow discharge around incision site)   Complete by: As directed    Call MD for:  severe uncontrolled pain   Complete by: As directed    Call MD for:  temperature >100.4   Complete by: As directed    Diet - low sodium heart healthy   Complete by: As directed  Discharge instructions   Complete by: As directed    It has been a pleasure taking care of you! You were admitted with fever, weakness, confusion and left leg swelling and redness.  You were treated with IV  antibiotics for possible cellulitis/left leg infection.  We are discharging you on more antibiotics to complete the treatment course.  Please review your new medication list and the directions before you take your medications. Please call your primary care office  as soon as possible to schedule hospital follow-up visit in 1 to 2 weeks.  Take care,   Increase activity slowly   Complete by: As directed      Allergies as of 08/03/2019      Reactions   Latex Other (See Comments)   She has a sensitivity to [facility] so prior drs told her to avoid latex and listed it as an allergy      Medication List    STOP taking these medications   hydrochlorothiazide 25 MG tablet Commonly known as: HYDRODIURIL     TAKE these medications   acetaminophen 500 MG tablet Commonly known as: TYLENOL Take 1,000 mg by mouth every 6 (six) hours as needed for moderate pain.   amLODipine 5 MG tablet Commonly known as: NORVASC Take 5 mg by mouth daily. What changed: Another medication with the same name was removed. Continue taking this medication, and follow the directions you see here.   atenolol 25 MG tablet Commonly known as: TENORMIN Take 0.5 tablets (12.5 mg total) by mouth daily. What changed: how much to take   doxycycline 100 MG tablet Commonly known as: VIBRA-TABS Take 1 tablet (100 mg total) by mouth 2 (two) times daily.   levothyroxine 50 MCG tablet Commonly known as: Synthroid Take 0.5 tablets (25 mcg total) by mouth daily before breakfast. What changed: how much to take   losartan 100 MG tablet Commonly known as: COZAAR Take 1 tablet (100 mg total) by mouth daily.   morphine 30 MG 12 hr tablet Commonly known as: MS CONTIN Take 1 tablet (30 mg total) by mouth every 12 (twelve) hours.   multivitamin tablet Take 1 tablet by mouth daily.   ondansetron 8 MG disintegrating tablet Commonly known as: Zofran ODT Take 1 tablet (8 mg total) by mouth every 8 (eight) hours as needed for  nausea or vomiting.   ondansetron 8 MG tablet Commonly known as: ZOFRAN Take 1 tablet (8 mg total) by mouth every 8 (eight) hours as needed for nausea or vomiting.   oxyCODONE-acetaminophen 5-325 MG tablet Commonly known as: PERCOCET/ROXICET Take 1 tablet by mouth every 4 (four) hours as needed for severe pain.   polyethylene glycol 17 g packet Commonly known as: MIRALAX / GLYCOLAX Take 17 g by mouth daily.   VITAMIN D PO Take 1 tablet by mouth daily. D3 1000units      Follow-up Information    Heath Lark, MD. Schedule an appointment as soon as possible for a visit in 1 week(s).   Specialty: Hematology and Oncology Contact information: [place] [address] [phone]           Consultations:  Oncology  Procedures/Studies:  2D Echo: None  Ct Abdomen Pelvis W Contrast  Result Date: 07/05/2019 CLINICAL DATA:  Epithelial ovarian/fallopian tube/primary peritoneal carcinoma. EXAM: CT ABDOMEN AND PELVIS WITH CONTRAST TECHNIQUE: Multidetector CT imaging of the abdomen and pelvis was performed using the standard protocol following bolus administration of intravenous contrast. CONTRAST:  178mL OMNIPAQUE IOHEXOL 300  MG/ML  SOLN COMPARISON:  04/26/2019 FINDINGS: Lower chest: 4 mm left lower lobe lung nodule noted, unchanged. Hepatobiliary: There is no focal liver abnormality identified. Previous cholecystectomy. Common bile duct is upper limits of normal in caliber measuring 6 mm. Pancreas: Unremarkable. No pancreatic ductal dilatation or surrounding inflammatory changes. Spleen: Normal in size without focal abnormality. Adrenals/Urinary Tract: Normal appearance of the adrenal glands. Small, 3 mm right lower pole kidney stone. No kidney mass or hydronephrosis identified at this time. The urinary bladder appears normal. Stomach/Bowel: Stomach is normal. No bowel wall inflammation or distension. Distal colonic diverticulosis identified without acute  inflammation. New serosal thickening along the posterior wall of the transverse colon, image 46/2. Vascular/Lymphatic: Aortic atherosclerosis. No aneurysm. Increase in size of retroperitoneal lymph nodes. Left periaortic node the up measures 0.8 cm, image 32/2 new from previous exam. Left retroperitoneal lymph node measures 0.7 cm, image 35/2 previously 0.3 cm. 1 cm left common iliac node noted, image 52/2. Previously 0.6 cm. 1.1 cm left external iliac node identified. Previously 0.9 cm. Reproductive: Status post hysterectomy. No adnexal masses. Other: Peritoneal carcinomatosis is identified. Most notable is in the left lower quadrant of the abdomen surrounding the distal transverse colon. Here, there is an area of soft tissue infiltration measuring 5.1 x 2.2 cm with adjacent serosal involvement of the colon, image 48/2. Anterior to the is a cystic lesion along the undersurface of the ventral abdominal wall which measures 3.3 x 2.1 cm, image 48/2. Peritoneal thickening and nodularity along the left pericolic gutter is new, image 42/5. Adjacent increased serosal soft tissue is noted along the lateral aspect of the descending colon, image 50/5. Within the left upper quadrant of the abdomen there is an area of peritoneal soft tissue adjacent to the distal transverse colon which measures 3.8 x 2.5 cm, image 38/5. Lymph node or peritoneal nodule is identified in the left lower quadrant of the abdomen measuring 1.0 x 1.6 cm, image 30/5. Previously 0.7 x 1.4 cm. Musculoskeletal: Scoliosis and degenerative disc disease identified within the lumbar spine. IMPRESSION: 1. Imaging findings consistent with recurrent peritoneal carcinomatosis. This is most apparent within the left abdomen involving the transverse colon and descending colon where there is increased soft tissue, loculated fluid and colonic serosal soft tissue thickening. 2. Increase in size of retroperitoneal and pelvic lymph nodes compatible with metastatic  adenopathy. 3. No evidence for bowel obstruction. Electronically Signed   By: Kerby Moors M.D.   On: 07/05/2019 11:55   Dg Chest Port 1 View  Result Date: 08/01/2019 CLINICAL DATA:  Daughter states that patient had fever and confusion since yesterday. Had cancer treatment on Monday. PT HX: non smoker EXAM: PORTABLE CHEST 1 VIEW COMPARISON:  07/17/2019 FINDINGS: Cardiac silhouette normal in size and configuration. No mediastinal or hilar masses or convincing adenopathy. Clear lungs.  No pleural effusion or pneumothorax. Stable right anterior chest wall Port-A-Cath. Skeletal structures are demineralized but grossly intact. IMPRESSION: No acute cardiopulmonary disease. Electronically Signed   By: Lajean Manes M.D.   On: 08/01/2019 19:23   Dg Chest Port 1 View  Result Date: 07/17/2019 CLINICAL DATA:  Hypoxemia, confusion and fatigue. Undergoing chemotherapy for GYN malignancy. EXAM: PORTABLE CHEST 1 VIEW COMPARISON:  Radiographs 12/31/2018.  Abdominal CT 07/05/2019. FINDINGS: 1141 hours. There are lower lung volumes. Right IJ Port-A-Cath tip is unchanged at the lower SVC level. The heart size and mediastinal contours are stable for the lesser degree of inspiration. There is mild bibasilar atelectasis, vascular congestion and possible mild  edema. No confluent airspace opacity, significant pleural effusion or pneumothorax. The bones appear unchanged. IMPRESSION: Lower lung volumes with bibasilar atelectasis, vascular congestion and possible mild pulmonary edema. No confluent airspace opacity. Electronically Signed   By: Richardean Sale M.D.   On: 07/17/2019 11:58   Vas [place] Lower Extremity Venous (dvt)  Result Date: 08/02/2019  Lower Venous Study Indications: Cellulitis.  Risk Factors: None identified. Comparison Study: No prior studies. Performing Technologist: Oliver Hum RVT  Examination Guidelines: A complete evaluation includes B-mode imaging, spectral Doppler, color Doppler, and power Doppler as  needed of all accessible portions of each vessel. Bilateral testing is considered an integral part of a complete examination. Limited examinations for reoccurring indications may be performed as noted.  +-----+---------------+---------+-----------+----------+--------------+  RIGHT Compressibility Phasicity Spontaneity Properties Thrombus Aging  +-----+---------------+---------+-----------+----------+--------------+  CFV   Full            Yes       Yes                                    +-----+---------------+---------+-----------+----------+--------------+   +---------+---------------+---------+-----------+----------+--------------+  LEFT      Compressibility Phasicity Spontaneity Properties Thrombus Aging  +---------+---------------+---------+-----------+----------+--------------+  CFV       Full            Yes       Yes                                    +---------+---------------+---------+-----------+----------+--------------+  SFJ       Full                                                             +---------+---------------+---------+-----------+----------+--------------+  FV Prox   Full                                                             +---------+---------------+---------+-----------+----------+--------------+  FV Mid    Full                                                             +---------+---------------+---------+-----------+----------+--------------+  FV Distal Full                                                             +---------+---------------+---------+-----------+----------+--------------+  PFV       Full                                                             +---------+---------------+---------+-----------+----------+--------------+  POP       Full            Yes       Yes                                    +---------+---------------+---------+-----------+----------+--------------+  PTV       Full                                                              +---------+---------------+---------+-----------+----------+--------------+  PERO      Full                                                             +---------+---------------+---------+-----------+----------+--------------+     Summary: Right: No evidence of common femoral vein obstruction. Left: There is no evidence of deep vein thrombosis in the lower extremity. No cystic structure found in the popliteal fossa.  *See table(s) above for measurements and observations. Electronically signed by Monica Martinez MD on 08/02/2019 at 4:36:22 PM.    Final       Subjective: No major events overnight of this morning.  Had some abdominal pain that is improved after she received a morphine this morning.  She says she would like to continue her home dose morphine.  She understand the risk and benefits but will want to live in a lot of pain.  She says she will do better if she goes home.  Daughter is in agreement.  Discharge Exam: Vitals:   08/03/19 0940 08/03/19 1005  BP: 114/65   Pulse: 63   Resp: 18   Temp: 97.7 F (36.5 C)   SpO2: 95% 93%    GENERAL: No acute distress.  Appears well.  HEENT: MMM.  Vision and hearing grossly intact.  NECK: Supple.  No JVD.  LUNGS:  No IWOB. Good air movement bilaterally. HEART:  RRR. Heart sounds normal.  ABD: Bowel sounds present. Soft. Non tender.  MSK/EXT:  Moves all extremities. No apparent deformity.  Trace left lower extremity edema SKIN: no apparent skin lesion or wound NEURO: Awake, alert and oriented appropriately.  No gross deficit.  PSYCH: Calm. Normal affect.     The results of significant diagnostics from this hospitalization (including imaging, microbiology, ancillary and laboratory) are listed below for reference.     Microbiology: Recent Results (from the past 240 hour(s))  Blood Culture (routine x 2)     Status: None (Preliminary result)   Collection Time: 08/01/19  6:00 PM   Specimen: BLOOD  Result Value Ref Range Status    Specimen Description   Final    BLOOD LEFT ANTECUBITAL Performed at [facility] [address], [place], [place] [address]    Special Requests   Final    BOTTLES DRAWN AEROBIC AND ANAEROBIC Blood Culture adequate volume Performed at [facility] [address], [place], [place] [address]    Culture   Final    NO GROWTH 2 DAYS Performed at [facility], [address] [place], [place] [address]    Report Status PENDING  Incomplete  Blood Culture (routine x 2)     Status: None (Preliminary result)   Collection Time: 08/01/19  6:34 PM   Specimen: BLOOD  Result Value Ref Range Status   Specimen Description   Final    BLOOD PORTA CATH Performed at [facility] [address], [place], [place] [address]    Special Requests   Final    BOTTLES DRAWN AEROBIC AND ANAEROBIC Blood Culture adequate volume Performed at [facility] [address], [place], [place] [address]    Culture   Final    NO GROWTH 2 DAYS Performed at [facility] [address], [place], [place] [address]    Report Status PENDING  Incomplete  Urine culture     Status: None (Preliminary result)   Collection Time: 08/01/19  6:34 PM   Specimen: In/Out Cath Urine  Result Value Ref Range Status   Specimen Description   Final    IN/OUT CATH URINE Performed at [facility] [address] [place], [place] [address]    Special Requests   Final    NONE Performed at [facility], [place] [address], [place], [place] [address]    Culture   Final    CULTURE REINCUBATED FOR BETTER GROWTH Performed at [facility], [place] [address], [place], [place] [address]    Report Status PENDING  Incomplete  SARS Coronavirus 2 [facility] order, Performed in [facility] hospital lab) Nasopharyngeal Nasopharyngeal Swab     Status: None   Collection Time: 08/01/19  9:49 PM   Specimen: Nasopharyngeal Swab    Result Value Ref Range Status   SARS Coronavirus 2 NEGATIVE NEGATIVE Final    Comment: (NOTE) If result is NEGATIVE SARS-CoV-2 target nucleic acids are NOT DETECTED. The SARS-CoV-2 RNA is generally detectable in upper and lower  respiratory specimens during the acute phase of infection. The lowest  concentration of SARS-CoV-2 viral copies this assay can detect is 250  copies / mL. A negative result does not preclude SARS-CoV-2 infection  and should not be used as the sole basis for treatment or other  patient management decisions.  A negative result may occur with  improper specimen collection / handling, submission of specimen other  than nasopharyngeal swab, presence of viral mutation(s) within the  areas targeted by this assay, and inadequate number of viral copies  (<250 copies / mL). A negative result must be combined with clinical  observations, patient history, and epidemiological information. If result is POSITIVE SARS-CoV-2 target nucleic acids are DETECTED. The SARS-CoV-2 RNA is generally detectable in upper and lower  respiratory specimens dur ing the acute phase of infection.  Positive  results are indicative of active infection with SARS-CoV-2.  Clinical  correlation with patient history and other diagnostic information is  necessary to determine patient infection status.  Positive results do  not rule out bacterial infection or co-infection with other viruses. If result is PRESUMPTIVE POSTIVE SARS-CoV-2 nucleic acids MAY BE PRESENT.   A presumptive positive result was obtained on the submitted specimen  and confirmed on repeat testing.  While 2019 novel coronavirus  (SARS-CoV-2) nucleic acids may be present in the submitted sample  additional confirmatory testing may be necessary for epidemiological  and / or clinical management purposes  to differentiate between  SARS-CoV-2 and other Sarbecovirus currently known to infect humans.  If clinically indicated  additional  testing with an alternate test  methodology [phone]) is advised. The SARS-CoV-2 RNA is generally  detectable in upper and lower respiratory sp ecimens during the acute  phase of infection. The expected result is Negative. Fact Sheet for Patients:  StrictlyIdeas.no Fact Sheet for Healthcare Providers: BankingDealers.co.za This test is not yet approved or cleared by the [place] FDA and has been authorized for detection and/or diagnosis of SARS-CoV-2 by FDA under an Emergency Use Authorization (EUA).  This EUA will remain in effect (meaning this test can be used) for the duration of the COVID-19 declaration under Section 564(b)(1) of the Act, 21 U.S.C. section 360bbb-3(b)(1), unless the authorization is terminated or revoked sooner. Performed at [facility], [place] [address], [place], [place] [address]      Labs: BNP (last 3 results) No results for input(s): BNP in the last 8760 hours. Basic Metabolic Panel: Recent Labs  Lab 07/29/19 1011 08/01/19 1834 08/02/19 0825 08/03/19 0500  NA 137 129* 133* 136  K 4.1 4.3 4.1 3.9  CL 103 100 104 109  CO2 25 19* 19* 21*  GLUCOSE 106* 125* 106* 134*  BUN 31* 28* 21 18  CREATININE 1.46* 1.18* 0.91 0.81  CALCIUM 9.0 8.2* 8.2* 8.3*  MG  --   --   --  1.6*   Liver Function Tests: Recent Labs  Lab 07/29/19 1011 08/01/19 1834  AST 19 31  ALT 18 26  ALKPHOS 90 79  BILITOT 0.3 0.5  PROT 6.8 6.2*  ALBUMIN 3.3* 2.8*   No results for input(s): LIPASE, AMYLASE in the last 168 hours. No results for input(s): AMMONIA in the last 168 hours. CBC: Recent Labs  Lab 07/29/19 1011 08/01/19 1834 08/02/19 0825 08/03/19 0500  WBC 4.9 9.4 7.2 4.1  NEUTROABS 2.9 8.7*  --   --   HGB 8.1* 6.7* 7.6* 8.1*  HCT 24.5* 20.7* 22.9* 25.0*  MCV 112.9* 114.4* 108.0* 107.8*  PLT 100* 105* 95* 74*   Cardiac Enzymes: No results for input(s): CKTOTAL, CKMB, CKMBINDEX, TROPONINI in  the last 168 hours. BNP: Invalid input(s): POCBNP CBG: No results for input(s): GLUCAP in the last 168 hours. D-Dimer No results for input(s): DDIMER in the last 72 hours. Hgb A1c No results for input(s): HGBA1C in the last 72 hours. Lipid Profile No results for input(s): CHOL, HDL, LDLCALC, TRIG, CHOLHDL, LDLDIRECT in the last 72 hours. Thyroid function studies No results for input(s): TSH, T4TOTAL, T3FREE, THYROIDAB in the last 72 hours.  Invalid input(s): FREET3 Anemia work up No results for input(s): VITAMINB12, FOLATE, FERRITIN, TIBC, IRON, RETICCTPCT in the last 72 hours. Urinalysis    Component Value Date/Time   COLORURINE YELLOW 08/01/2019 1834   APPEARANCEUR CLEAR 08/01/2019 1834   LABSPEC 1.012 08/01/2019 1834   PHURINE 5.0 08/01/2019 1834   GLUCOSEU NEGATIVE 08/01/2019 1834   HGBUR NEGATIVE 08/01/2019 [place] 08/01/2019 1834   BILIRUBINUR negative 07/13/2018 1210   KETONESUR NEGATIVE 08/01/2019 1834   PROTEINUR NEGATIVE 08/01/2019 1834   UROBILINOGEN 0.2 07/13/2018 1210   NITRITE NEGATIVE 08/01/2019 1834   LEUKOCYTESUR NEGATIVE 08/01/2019 1834   Sepsis Labs Invalid input(s): PROCALCITONIN,  WBC,  LACTICIDVEN   Time coordinating discharge: 35 minutes  SIGNED:  Mercy Riding, MD  [facility] 08/03/2019, 12:27 PM  If 7PM-7AM, please contact night-coverage [email or web address] Password [email or web address]

## 2019-08-03 NOTE — Evaluation (Signed)
Occupational Therapy Evaluation Patient Details Name: Lisa Moyer MRN: [id number] DOB: Feb 20, 1935 Today's Date: 08/03/2019    History of Present Illness 84 y.o. 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   Clinical Impression   Pt seen for initial evaluation, with focus on energy conservation. Daughter present and has been helping her at home. Pt is hopeful for d/c home today. Recommend 3:1 commode    Follow Up Recommendations  Supervision/Assistance - 24 hour    Equipment Recommendations  3 in 1 bedside commode    Recommendations for Other Services       Precautions / Restrictions Precautions Precautions: Fall Restrictions Weight Bearing Restrictions: No      Mobility Bed Mobility               General bed mobility comments: oob  Transfers   Equipment used: None   Sit to Stand: Supervision              Balance                                           ADL either performed or assessed with clinical judgement   ADL Overall ADL's : Needs assistance/impaired                         Toilet Transfer: Min guard             General ADL Comments: pt min guard ambulating with iv pole.  she did not want to use cane.  educated on energy conservation and had her take a seated rest break as she had 2/4 dyspnea after walking a good distance, > 200 feet.  Gave her handouts on energy conservation. Daughter reports that she has been doing quite a bit for pt. Did not assess LB adls due to discomfort after walking:  based on clinical judgment, mod A.and set up for UB.  Daughter would like 3:1 for home use at night     Vision         Perception     Praxis      Pertinent Vitals/Pain Pain Assessment: Faces Faces Pain Scale: Hurts little  more Pain Location: stomach Pain Descriptors / Indicators: Aching Pain Intervention(s): Limited activity within patient's tolerance;Monitored during session;Patient requesting pain meds-RN notified     Hand Dominance     Extremity/Trunk Assessment Upper Extremity Assessment Upper Extremity Assessment: Overall WFL for tasks assessed           Communication Communication Communication: No difficulties   Cognition Arousal/Alertness: Awake/alert Behavior During Therapy: WFL for tasks assessed/performed Overall Cognitive Status: Within Functional Limits for tasks assessed                                     General Comments       Exercises     Shoulder Instructions      Home Living Family/patient expects to be discharged to:: Private residence Living Arrangements: Children Available Help at Discharge: Family;Available 24 hours/day               Bathroom Shower/Tub: Occupational psychologist: Standard     Home  Equipment: Marine scientist - single point          Prior Functioning/Environment Level of Independence: Independent        Comments: dtr from [place] is here staying with pt for now; son lives here        OT Problem List:        OT Treatment/Interventions:      OT Goals(Current goals can be found in the care plan section) Acute Rehab OT Goals Patient Stated Goal: to not suffer OT Goal Formulation: With patient  OT Frequency:     Barriers to D/C:            Co-evaluation              AM-PAC OT "6 Clicks" Daily Activity     Outcome Measure Help from another person eating meals?: None Help from another person taking care of personal grooming?: A Little Help from another person toileting, which includes using toliet, bedpan, or urinal?: A Little Help from another person bathing (including washing, rinsing, drying)?: A Lot Help from another person to put on and taking off regular upper body clothing?: A  Little Help from another person to put on and taking off regular lower body clothing?: A Lot 6 Click Score: 17   End of Session    Activity Tolerance: Patient limited by fatigue;Patient limited by pain Patient left: in chair;with call bell/phone within reach;with family/visitor present;with nursing/sitter in room  OT Visit Diagnosis: Muscle weakness (generalized) (M62.81)                Time: [id number] OT Time Calculation (min): 20 min Charges:  OT General Charges $OT Visit: 1 Visit OT Evaluation $OT Eval Low Complexity: [place], OTR/L Acute Rehabilitation Services [phone] [facility] pager [phone] office 08/03/2019  Timaya Bojarski 08/03/2019, 12:34 PM

## 2019-08-03 NOTE — Progress Notes (Signed)
This RN called to patient's room by patient's daughter. Patient noted to be having audible wheezes while ambulating around the room. Patient denied being SOB and cont' pulse ox showed an O2 sat of 90%. Daughter states that she thinks she remembers her mother wheezing while ambulating for several days now. Will inform on call provider and continue to monitor.

## 2019-08-03 NOTE — Progress Notes (Signed)
Pharmacy Antibiotic Note  Lisa Moyer is a 84 y.o. female with fallopian tube cancer currently on chemotherapy treatment (received chemo on 9/28) presented to the ED on 08/01/2019 with fever, AMS, and lower extremity erythema.  LE doppler on 10/2 was negative for DVT.  Patient's currently on vancomycin for fever and suspected sepsis/cellulitis.  Today, 08/03/2019: - day #2 abx - Tmax 100.6, wbc wnl - scr trending down 0.81 (crcl~52) - all cultures are negative thus far  Plan: - increase vancomycin to 1000 mg IV q24h for est AUC 451 - monitor renal function and clinical status  _________________________________________  Height: 5\' 4"  (162.6 cm) Weight: 158 lb 4.8 oz (71.8 kg) IBW/kg (Calculated) : 54.7  Temp (24hrs), Avg:99.1 F (37.3 C), Min:98.1 F (36.7 C), Max:100.6 F (38.1 C)  Recent Labs  Lab 07/29/19 1011 08/01/19 1834 08/01/19 1902 08/01/19 2034 08/02/19 0825 08/03/19 0500  WBC 4.9 9.4  --   --  7.2 4.1  CREATININE 1.46* 1.18*  --   --  0.91 0.81  LATICACIDVEN  --   --  0.7 0.9  --   --     Estimated Creatinine Clearance: 52 mL/min (by C-G formula based on SCr of 0.81 mg/dL).    Allergies  Allergen Reactions  . Latex Other (See Comments)    She has a sensitivity to [facility] so prior drs told her to avoid latex and listed it as an allergy     Antimicrobials this admission: 10/1 cefepime x1  10/1 vancomycin >>    Microbiology results: 10/1 BCx x2:  10/1 UCx:   Thank you for allowing pharmacy to be a part of this patient's care.  Dia Sitter P 08/03/2019 8:00 AM

## 2019-08-04 LAB — URINE CULTURE: Culture: 3000 — AB

## 2019-08-05 ENCOUNTER — Inpatient Hospital Stay: Payer: Medicare Other

## 2019-08-05 ENCOUNTER — Telehealth: Payer: Self-pay | Admitting: Hematology and Oncology

## 2019-08-05 ENCOUNTER — Encounter: Payer: Self-pay | Admitting: Hematology and Oncology

## 2019-08-05 NOTE — Telephone Encounter (Signed)
Scheduled appt per 10/5 sch message - pt daughter is aware of appt date and time

## 2019-08-06 LAB — CULTURE, BLOOD (ROUTINE X 2)
Culture: NO GROWTH
Culture: NO GROWTH
Special Requests: ADEQUATE
Special Requests: ADEQUATE

## 2019-08-08 ENCOUNTER — Inpatient Hospital Stay: Payer: Medicare Other | Attending: Hematology and Oncology | Admitting: Hematology and Oncology

## 2019-08-08 ENCOUNTER — Encounter: Payer: Self-pay | Admitting: Hematology and Oncology

## 2019-08-08 ENCOUNTER — Telehealth: Payer: Self-pay

## 2019-08-08 ENCOUNTER — Other Ambulatory Visit: Payer: Self-pay

## 2019-08-08 ENCOUNTER — Telehealth: Payer: Self-pay | Admitting: Hematology and Oncology

## 2019-08-08 ENCOUNTER — Inpatient Hospital Stay: Payer: Medicare Other

## 2019-08-08 DIAGNOSIS — I10 Essential (primary) hypertension: Secondary | ICD-10-CM | POA: Insufficient documentation

## 2019-08-08 DIAGNOSIS — C786 Secondary malignant neoplasm of retroperitoneum and peritoneum: Secondary | ICD-10-CM | POA: Insufficient documentation

## 2019-08-08 DIAGNOSIS — Z90710 Acquired absence of both cervix and uterus: Secondary | ICD-10-CM | POA: Diagnosis not present

## 2019-08-08 DIAGNOSIS — R630 Anorexia: Secondary | ICD-10-CM | POA: Diagnosis not present

## 2019-08-08 DIAGNOSIS — Z79899 Other long term (current) drug therapy: Secondary | ICD-10-CM | POA: Insufficient documentation

## 2019-08-08 DIAGNOSIS — R64 Cachexia: Secondary | ICD-10-CM | POA: Insufficient documentation

## 2019-08-08 DIAGNOSIS — C5700 Malignant neoplasm of unspecified fallopian tube: Secondary | ICD-10-CM

## 2019-08-08 DIAGNOSIS — R5383 Other fatigue: Secondary | ICD-10-CM | POA: Insufficient documentation

## 2019-08-08 DIAGNOSIS — C5701 Malignant neoplasm of right fallopian tube: Secondary | ICD-10-CM | POA: Diagnosis present

## 2019-08-08 DIAGNOSIS — Z66 Do not resuscitate: Secondary | ICD-10-CM | POA: Insufficient documentation

## 2019-08-08 DIAGNOSIS — D539 Nutritional anemia, unspecified: Secondary | ICD-10-CM

## 2019-08-08 DIAGNOSIS — D61818 Other pancytopenia: Secondary | ICD-10-CM | POA: Insufficient documentation

## 2019-08-08 DIAGNOSIS — D6481 Anemia due to antineoplastic chemotherapy: Secondary | ICD-10-CM | POA: Diagnosis not present

## 2019-08-08 DIAGNOSIS — Z9221 Personal history of antineoplastic chemotherapy: Secondary | ICD-10-CM | POA: Insufficient documentation

## 2019-08-08 DIAGNOSIS — Z90722 Acquired absence of ovaries, bilateral: Secondary | ICD-10-CM | POA: Diagnosis not present

## 2019-08-08 DIAGNOSIS — I959 Hypotension, unspecified: Secondary | ICD-10-CM | POA: Insufficient documentation

## 2019-08-08 DIAGNOSIS — Z79891 Long term (current) use of opiate analgesic: Secondary | ICD-10-CM | POA: Insufficient documentation

## 2019-08-08 DIAGNOSIS — T451X5A Adverse effect of antineoplastic and immunosuppressive drugs, initial encounter: Secondary | ICD-10-CM

## 2019-08-08 DIAGNOSIS — Z7189 Other specified counseling: Secondary | ICD-10-CM

## 2019-08-08 DIAGNOSIS — G893 Neoplasm related pain (acute) (chronic): Secondary | ICD-10-CM | POA: Diagnosis not present

## 2019-08-08 DIAGNOSIS — E039 Hypothyroidism, unspecified: Secondary | ICD-10-CM

## 2019-08-08 DIAGNOSIS — Z7952 Long term (current) use of systemic steroids: Secondary | ICD-10-CM | POA: Diagnosis not present

## 2019-08-08 LAB — CBC WITH DIFFERENTIAL/PLATELET
Abs Immature Granulocytes: 0.17 10*3/uL — ABNORMAL HIGH (ref 0.00–0.07)
Basophils Absolute: 0 10*3/uL (ref 0.0–0.1)
Basophils Relative: 1 %
Eosinophils Absolute: 0.1 10*3/uL (ref 0.0–0.5)
Eosinophils Relative: 2 %
HCT: 31.4 % — ABNORMAL LOW (ref 36.0–46.0)
Hemoglobin: 10.3 g/dL — ABNORMAL LOW (ref 12.0–15.0)
Immature Granulocytes: 5 %
Lymphocytes Relative: 27 %
Lymphs Abs: 1 10*3/uL (ref 0.7–4.0)
MCH: 34.7 pg — ABNORMAL HIGH (ref 26.0–34.0)
MCHC: 32.8 g/dL (ref 30.0–36.0)
MCV: 105.7 fL — ABNORMAL HIGH (ref 80.0–100.0)
Monocytes Absolute: 0.5 10*3/uL (ref 0.1–1.0)
Monocytes Relative: 14 %
Neutro Abs: 1.9 10*3/uL (ref 1.7–7.7)
Neutrophils Relative %: 51 %
Platelets: 43 10*3/uL — ABNORMAL LOW (ref 150–400)
RBC: 2.97 MIL/uL — ABNORMAL LOW (ref 3.87–5.11)
RDW: 15.7 % — ABNORMAL HIGH (ref 11.5–15.5)
WBC: 3.7 10*3/uL — ABNORMAL LOW (ref 4.0–10.5)
nRBC: 0 % (ref 0.0–0.2)

## 2019-08-08 LAB — COMPREHENSIVE METABOLIC PANEL
ALT: 33 U/L (ref 0–44)
AST: 44 U/L — ABNORMAL HIGH (ref 15–41)
Albumin: 2.9 g/dL — ABNORMAL LOW (ref 3.5–5.0)
Alkaline Phosphatase: 134 U/L — ABNORMAL HIGH (ref 38–126)
Anion gap: 10 (ref 5–15)
BUN: 15 mg/dL (ref 8–23)
CO2: 26 mmol/L (ref 22–32)
Calcium: 9.2 mg/dL (ref 8.9–10.3)
Chloride: 103 mmol/L (ref 98–111)
Creatinine, Ser: 0.83 mg/dL (ref 0.44–1.00)
GFR calc Af Amer: >60 mL/min (ref >60)
GFR calc non Af Amer: >60 mL/min (ref >60)
Glucose, Bld: 110 mg/dL — ABNORMAL HIGH (ref 70–99)
Potassium: 4.1 mmol/L (ref 3.5–5.1)
Sodium: 139 mmol/L (ref 135–145)
Total Bilirubin: 0.3 mg/dL (ref 0.3–1.2)
Total Protein: 6.8 g/dL (ref 6.5–8.1)

## 2019-08-08 LAB — SAMPLE TO BLOOD BANK

## 2019-08-08 LAB — TSH: TSH: 2.616 u[IU]/mL (ref 0.308–3.960)

## 2019-08-08 MED ORDER — MORPHINE SULFATE 15 MG PO TABS: 15.0000 mg | ORAL_TABLET | ORAL | 0 refills | Status: DC | PRN

## 2019-08-08 MED ORDER — MORPHINE SULFATE ER 30 MG PO TBCR: 30.0000 mg | EXTENDED_RELEASE_TABLET | Freq: Three times a day (TID) | ORAL | 0 refills | Status: DC

## 2019-08-08 MED ORDER — AMLODIPINE BESYLATE 5 MG PO TABS: 5.0000 mg | ORAL_TABLET | Freq: Every day | ORAL | 11 refills | Status: DC

## 2019-08-08 NOTE — Assessment & Plan Note (Signed)
Her pain control today is reasonable However, during daytime, she is somewhat sleepy I recommend her to continue on MS Contin 30 mg twice a day Recently, we introduce midday MS Contin 30 mg as well However, given his sleepiness, I also recommend a trial of IR morphine 15 mg as needed I would discontinue Percocet because it is ineffective and I am concerned about additional doses of Tylenol associated with Percocet Her daughter will call me next week for medication refill

## 2019-08-08 NOTE — Assessment & Plan Note (Signed)
She has severe pancytopenia due to recent treatment Thankfully, she is not symptomatic right now She does not need transfusion support today

## 2019-08-08 NOTE — Assessment & Plan Note (Signed)
We have extensive discussions about goals of care She tolerated gemcitabine poorly despite medication adjustment The patient has made informed decision to stop chemotherapy and focus on quality of life which I think is reasonable I proceed to cancel her treatment next week For now, due to reasonable performance status, she agree with palliative care consult but not necessarily hospice yet I plan to see her again in 2 weeks for further follow-up and supportive care

## 2019-08-08 NOTE — Assessment & Plan Note (Signed)
We have numerous goals of care discussions in the past The patient understood that with each treatment received, it has caused tremendous complications in the past and that could potentially take away quality of life She does not want to continue on treatment that will take away quality of life She agreed to stop chemotherapy for now However, she is not sure she is ready for hospice yet With discussed the concept of palliative care referral for additional layer of supportive symptom management and she agreed for this referral She would like to continue close follow-up and symptom management with me I have made appointment to see her back in 2 weeks but will proceed with palliative care consult We discussed CODE STATUS and she agreed to DNR I gave her a DNR order I also explained the concept of the MOST form We discussed extensively about the role of antibiotics, transfusion support, artificial IV fluids/feeding, etc. I recommend the patient and family members to think about this a little bit more over the next 2 weeks

## 2019-08-08 NOTE — Telephone Encounter (Signed)
I left a message on the daughter number with schedule

## 2019-08-08 NOTE — Telephone Encounter (Signed)
I left a message regarding schedule  

## 2019-08-08 NOTE — Assessment & Plan Note (Signed)
She was transfused recently She does not need transfusion support today We discussed the role of transfusion support in the setting of terminal palliative care For now, she wants to continue transfusion support for symptom management I will check her blood count again in 2 weeks

## 2019-08-08 NOTE — Progress Notes (Signed)
[facility] OFFICE PROGRESS NOTE  Patient Care Team: Patient, No Pcp Per as PCP - General ([place]) Ileana Roup, MD as Consulting Physician (General Surgery)  ASSESSMENT & PLAN:  Fallopian tube cancer, carcinoma ([place]) We have extensive discussions about goals of care She tolerated gemcitabine poorly despite medication adjustment The patient has made informed decision to stop chemotherapy and focus on quality of life which I think is reasonable I proceed to cancel her treatment next week For now, due to reasonable performance status, she agree with palliative care consult but not necessarily hospice yet I plan to see her again in 2 weeks for further follow-up and supportive care  Pancytopenia, acquired [facility]) She has severe pancytopenia due to recent treatment Thankfully, she is not symptomatic right now She does not need transfusion support today  Cancer associated pain Her pain control today is reasonable However, during daytime, she is somewhat sleepy I recommend her to continue on MS Contin 30 mg twice a day Recently, we introduce midday MS Contin 30 mg as well However, given his sleepiness, I also recommend a trial of IR morphine 15 mg as needed I would discontinue Percocet because it is ineffective and I am concerned about additional doses of Tylenol associated with Percocet Her daughter will call me next week for medication refill  Anemia due to antineoplastic chemotherapy She was transfused recently She does not need transfusion support today We discussed the role of transfusion support in the setting of terminal palliative care For now, she wants to continue transfusion support for symptom management I will check her blood count again in 2 weeks  Essential hypertension Blood pressure medication was recently adjusted I refilled her prescription amlodipine Expect her blood pressure might start to trend down in the future if she stops eating  or drinking much due to her peritoneal disease We will continue to monitor her blood pressure closely  Goals of care, counseling/discussion We have numerous goals of care discussions in the past The patient understood that with each treatment received, it has caused tremendous complications in the past and that could potentially take away quality of life She does not want to continue on treatment that will take away quality of life She agreed to stop chemotherapy for now However, she is not sure she is ready for hospice yet With discussed the concept of palliative care referral for additional layer of supportive symptom management and she agreed for this referral She would like to continue close follow-up and symptom management with me I have made appointment to see her back in 2 weeks but will proceed with palliative care consult We discussed CODE STATUS and she agreed to DNR I gave her a DNR order I also explained the concept of the MOST form We discussed extensively about the role of antibiotics, transfusion support, artificial IV fluids/feeding, etc. I recommend the patient and family members to think about this a little bit more over the next 2 weeks   No orders of the defined types were placed in this encounter.   INTERVAL HISTORY: Please see below for problem oriented charting. She returns for further management with her daughter since recent discharge from the hospital last weekend I have previously reviewed pain management with her daughter and we recently increased the dose of her pain medicine to MS Contin every 8 hours She complained of some mild sleepiness during daytime Daughter noticed that Percocet was no longer helpful to get her pain under good control She brought  with her a list of 11 questions today The patient expressed desire to stop chemotherapy She has numerous questions about the role of hospice, prognosis, symptom management in relation to pain, constipation,  hydration, blood pressure medication as well as transfusion support She has some good days and bad days and is wondering whether she can resume chemotherapy if she have some good days She is concerned about Tylenol and risk of overdose She is asking questions about her blood pressure management and additional ancillary equipment such as bedside commode  SUMMARY OF ONCOLOGIC HISTORY: Oncology History Overview Note  Neg genetics.  HRD test was done on peritoneal fluid from 07/18/2018: HRD +   Fallopian tube cancer, carcinoma ([place])  06/18/2016 Tumor Marker   Patient's tumor was tested for the following markers: CA-125 Results of the tumor marker test revealed 298.   06/23/2016 PET scan   Diffuse hypermetabolic peritoneal carcinomatosis in the abdomen and pelvis, internal mammary adenopathy in the chest, hypermetabolic right paracardiac/diaphragmatic LN, bilateral pleural effusion and moderate ascites   06/24/2016 Procedure   Therapeutic paracentesis   06/24/2016 Pathology Results   Positive for adenocarcinoma, Mullerian primary   06/28/2016 Procedure   She underwent CT guided biopsy of omentum   06/28/2016 Pathology Results   High grade serous involving the omentum, positive for p53, PAX8, WT1 and p16.   07/01/2016 Procedure   Findings/impression:   1. Sonographic evaluation of the right internal jugular vein confirms patency. Sonography was required to gain central venous access. An image documenting patency and needle access was saved.   2. Successful placement of right internal jugular vein subcutaneous Mediport as described. Spot film shows the catheter tip at the cavoatrial junction. No pneumothorax. The Mediport aspirates and flushes normally.   07/02/2016 - 08/12/2016 Chemotherapy   The patient had 3 cycles of carboplatin and taxol chemotherapy treatment.     08/29/2016 Imaging   Ct scan of chest, abdomen and pelvis showed marked improvement and resolution of thoracic  adenopathy   09/08/2016 Surgery   She underwent interval debulking surgery with TAH/BSO and infracolic omentectomy by Dr. Wynelle Cleveland   09/08/2016 Pathology Results   Right tube: scant serous carcinoma. No viable tumor. Left tube and ovary: normal. Omentum: low volume serous carcinoma with few psamomma bodies.   10/07/2016 - 11/18/2016 Chemotherapy   The patient had 3 more cycles of carboplatin and taxol for chemotherapy treatment.     02/07/2017 Imaging   Impression:  1. Colonic diverticulosis without acute diverticulitis. 2. Stable calcification right kidney without hydronephrosis. 3. No acute process or significant interval change has occurred since August 29, 2016.   05/09/2018 Imaging   Impression:  Findings are most suspicious for acute sigmoid colonic diverticulitis with intraloop abscess formation.   05/14/2018 Imaging   Impression: The pelvic fluid collection contiguous with sigmoid colonic diverticulitis changes has not significant change since May 09, 2018. No new fluid collections are seen   06/28/2018 Imaging   IMPRESSION: Changes consistent with diverticulitis with free fluid within the pelvis. No focal contained abscess is seen. A previously noted fluid collection deep within the pelvis has resolved in the interval.   Stable nonobstructing right renal stone.  The remainder of the exam is stable from the prior study.    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 There is omental caking as well as stranding within various areas  of the peritoneal fat. There is also wall thickening of the sigmoid colon with adjacent stranding. This finding can also be related to peritoneal carcinomatosis and implants although an inflammatory process of the sigmoid colon is not excluded.   Right nephrolithiasis.  Ileus  pattern.   07/16/2018 Imaging   Impression:   Findings of peritoneal carcinomatosis, rapidly progressive. Not present in July 2019, progressed when compared to September 13.  Multiple bowel loops are distorted but no distention or transition zone to suggest a component of active obstruction.   07/18/2018 Procedure   Findings/impression:  1. Sonographic evaluation of ascites. Sonography was required to gain access to ascites. An image documenting ascites was saved. 2. Successful ultrasound guided paracentesis using a temporary peritoneal drainage catheter inserted in the right upper quadrant as described yielding 600cc of ascites.    07/20/2018 Echocardiogram   General:  The patient was in normal sinus rhythm.  Left Ventricle:  Left ventricular ejection fraction was normal, estimated in the range of 60 to 65%. The left ventricular cavity size appears normal. The left ventricular wall thickness appears normal. The left ventricle is thickened in a fashion  consistent with mild concentric hypertrophy. Doppler tissue velocities and mitral inflow profile are consistent with Stage I diastolic dysfunction. The calculated ejection fraction is 68.1 %.  Right Ventricle:  The right ventricle appears normal in size and function.  Left Atrium:  The left atrium appears normal.  Right Atrium:  The right atrium appears normal.  Aortic Valve:  The structure of the aortic valve is tricuspid. There is evidence of trivial (trace) aortic regurgitation.  Mitral Valve:  There is no evidence of mitral regurgitation. The mitral valve appears normal in structure.  Pulmonic Valve:  There is evidence of trace (trivial) pulmonic regurgitation. The pulmonic valve appears normal in structure.  Tricuspid Valve:  There is evidence of trace (trivial) tricuspid regurgitation. The tricuspid valve appears normal in structure.  Pericardium:  There is no evidence of pericardial effusion.  Aorta:  The visualized portions  of the aorta (ascending aorta, aortic root, and aortic arch) appear normal.  Pulmonic Artery:  Unable to obtain RVSP due to insufficient tricuspid regurgitation jet.  Conclusions: Left ventricular ejection fraction was normal, estimated in the range of 60 to 65%. The left ventricular cavity size appears normal. The left ventricular wall thickness appears normal. The left ventricle is thickened in a fashion consistent with mild concentric hypertrophy. Doppler tissue velocities and mitral inflow profile are consistent with Stage I diastolic dysfunction. The calculated ejection fraction is 68.1%.There is evidence of trivial (trace) aortic regurgitation.There is evidence of trace (trivial) pulmonic regurgitation.There is evidence of trace (trivial) tricuspid regurgitation.There is no evidence of pericardial effusion.Unable to obtain RVSP due to insufficient tricuspid regurgitation jet.   07/23/2018 - 12/25/2018 Chemotherapy   The patient had carboplatin, doxil and Avastin for chemotherapy treatment x 6 cycles   08/01/2018 Imaging   Ct head showed no injuries.   08/01/2018 Procedure   Findings/impression:  1. Sonographic evaluation of ascites. Sonography was required to gain access to ascites. An image documenting ascites was saved. 2. Successful ultrasound guided paracentesis using a temporary peritoneal drainage catheter inserted in the right upper quadrant as described yielding 400cc of ascites.    08/06/2018 Tumor Marker   Patient's tumor was tested for the following markers: CA-125 Results of the tumor marker test revealed 373   08/20/2018 Tumor Marker   Patient's tumor was tested for the following markers: CA-125 Results of the tumor  marker test revealed 51.   10/03/2018 Genetic Testing   Patient has genetic testing done for genetics testing using her saliva. 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  Liver and spleen homogeneously enhance. No lesion seen.  The adjacent gallbladder is surgically absent.  The adrenal glands, kidneys, pancreas are normal.  Pelvic contents are remarkable for an absent uterus.  The mesenteric stranding has nearly completely resolved. Minimal reticular prominence throughout the pelvis.  No residual ascites.  No bowel wall thickening or. No dilated loops or transition zone.  The appendix is not seen. But no inflammatory changes in the right lower quadrant.  Osseous structures are intact.   11/27/2018 Imaging   Ct scan abdomen and pelvis showed colonic diverticulosis and nonspecific fluid levels in the bowel   12/24/2018 Cancer Staging   Staging form: Ovary, Fallopian Tube, and Primary Peritoneal Carcinoma, AJCC 8th Edition - Pathologic: Stage IVB (rpT3a, pN0, pM1b) - Signed by Heath Lark, MD on 12/24/2018   12/31/2018 Tumor Marker   Patient's tumor was tested for the following markers: CA-125 Results of the tumor marker test revealed 14.4   01/01/2019 Imaging   1. LEFT jugular Port-A-Cath catheter tip projects over the mid SVC. 2. No acute cardiopulmonary disease.    01/04/2019 Echocardiogram   1. The left ventricle has normal systolic function with an ejection fraction of 60-65%. The cavity size was normal. Left ventricular diastolic Doppler parameters are consistent with impaired relaxation.  2. The right ventricle has normal systolic function. The cavity was normal. There is no increase in right ventricular wall thickness.  3. The mitral valve is normal in structure.  4. The tricuspid valve is normal in structure.  5. The aortic valve is normal in structure.  6. The pulmonic valve was normal in structure.  7. 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    02/13/2019 -  Chemotherapy   The patient is started on Lynparza due to [place]    04/22/2019 Tumor Marker   Patient's tumor was tested for the following markers:  CA-125 Results of the tumor marker test revealed 44.8   04/26/2019 Imaging   1. No evidence of recurrent or metastatic carcinoma within the abdomen or pelvis. 2. Colonic diverticulosis, without radiographic evidence of diverticulitis or other acute findings.   Aortic Atherosclerosis (ICD10-I70.0).   05/28/2019 Tumor Marker   Patient's tumor was tested for the following markers: CA-125 Results of the tumor marker test revealed 104   07/05/2019 Imaging   1. Imaging findings consistent with recurrent peritoneal carcinomatosis. This is most apparent within the left abdomen involving the transverse colon and descending colon where there is increased soft tissue, loculated fluid and colonic serosal soft tissue thickening. 2. Increase in size of retroperitoneal and pelvic lymph nodes compatible with metastatic adenopathy. 3. No evidence for bowel obstruction.     07/05/2019 Tumor Marker   Patient's tumor was tested for the following markers: CA-125 Results of the tumor marker test revealed 233   07/15/2019 -  Chemotherapy   The patient had gemcitabine (GEMZAR) 1,368 mg in sodium chloride 0.9 % 250 mL chemo infusion, 800 mg/m2 = 1,368 mg (80 % of original dose 1,000 mg/m2), Intravenous,  Once, 1 of 4 cycles Dose modification: 800 mg/m2 (80 % of original dose 1,000 mg/m2, Cycle 1, Reason: Patient Age), 600 mg/m2 (60 % of original dose 1,000 mg/m2,  Cycle 1, Reason: Dose Not Tolerated) Administration: 1,368 mg (07/15/2019), 1,026 mg (07/29/2019)  for chemotherapy treatment.    08/01/2019 - 08/03/2019 Hospital Admission   She was admitted to the hospital due to fever and pancytopenia She was placed on antibiotics treatment and received transfusion support     REVIEW OF SYSTEMS:   Constitutional: Denies fevers, chills or abnormal weight loss Eyes: Denies blurriness of vision Ears, nose, mouth, throat, and face: Denies mucositis or sore throat Respiratory: Denies cough, dyspnea or  wheezes Cardiovascular: Denies palpitation, chest discomfort  Gastrointestinal:  Denies nausea, heartburn or change in bowel habits Skin: Denies abnormal skin rashes Lymphatics: Denies new lymphadenopathy or easy bruising NBehavioral/Psych: Mood is stable, no new changes  All other systems were reviewed with the patient and are negative.  I have reviewed the past medical history, past surgical history, social history and family history with the patient and they are unchanged from previous note.  ALLERGIES:  is allergic to latex.  MEDICATIONS:  Current Outpatient Medications  Medication Sig Dispense Refill  . acetaminophen (TYLENOL) 500 MG tablet Take 1,000 mg by mouth every 6 (six) hours as needed for moderate pain.    Marland Kitchen amLODipine (NORVASC) 5 MG tablet Take 1 tablet (5 mg total) by mouth daily. 30 tablet 11  . atenolol (TENORMIN) 25 MG tablet Take 0.5 tablets (12.5 mg total) by mouth daily. (Patient taking differently: Take 25 mg by mouth daily. ) 30 tablet 11  . Cholecalciferol (VITAMIN D PO) Take 1 tablet by mouth daily. D3 1000units    . doxycycline (VIBRA-TABS) 100 MG tablet Take 1 tablet (100 mg total) by mouth 2 (two) times daily. 16 tablet 0  . levothyroxine (SYNTHROID) 50 MCG tablet Take 0.5 tablets (25 mcg total) by mouth daily before breakfast. (Patient taking differently: Take 50 mcg by mouth daily before breakfast. ) 30 tablet 3  . losartan (COZAAR) 100 MG tablet Take 1 tablet (100 mg total) by mouth daily. 30 tablet 0  . morphine (MS CONTIN) 30 MG 12 hr tablet Take 1 tablet (30 mg total) by mouth every 8 (eight) hours. 60 tablet 0  . morphine (MSIR) 15 MG tablet Take 1 tablet (15 mg total) by mouth every 4 (four) hours as needed for severe pain. 90 tablet 0  . Multiple Vitamin (MULTIVITAMIN) tablet Take 1 tablet by mouth daily.    . ondansetron (ZOFRAN ODT) 8 MG disintegrating tablet Take 1 tablet (8 mg total) by mouth every 8 (eight) hours as needed for nausea or vomiting. 30  tablet 3  . ondansetron (ZOFRAN) 8 MG tablet Take 1 tablet (8 mg total) by mouth every 8 (eight) hours as needed for nausea or vomiting. 20 tablet 0  . polyethylene glycol (MIRALAX / GLYCOLAX) 17 g packet Take 17 g by mouth daily.      No current facility-administered medications for this visit.     PHYSICAL EXAMINATION: ECOG PERFORMANCE STATUS: 2 - Symptomatic, <50% confined to bed  Vitals:   08/08/19 1315  BP: (!) 152/93  Pulse: 66  Resp: 18  Temp: 98.9 F (37.2 C)  SpO2: 98%   Filed Weights   08/08/19 1315  Weight: 157 lb 14.4 oz (71.6 kg)    GENERAL:alert, no distress and comfortable.  She looks frail and weak HEART: Noted mild bilateral lower extremity edema Musculoskeletal:no cyanosis of digits and no clubbing  NEURO: alert & oriented x 3 with fluent speech, no focal motor/sensory deficits  LABORATORY DATA:  I have reviewed the  data as listed    Component Value Date/Time   NA 139 08/08/2019 1247   NA 139 06/27/2018 1649   K 4.1 08/08/2019 1247   CL 103 08/08/2019 1247   CO2 26 08/08/2019 1247   GLUCOSE 110 (H) 08/08/2019 1247   BUN 15 08/08/2019 1247   BUN 17 06/27/2018 1649   CREATININE 0.83 08/08/2019 1247   CALCIUM 9.2 08/08/2019 1247   PROT 6.8 08/08/2019 1247   PROT 6.8 05/24/2018 0851   ALBUMIN 2.9 (L) 08/08/2019 1247   ALBUMIN 4.2 05/24/2018 0851   AST 44 (H) 08/08/2019 1247   ALT 33 08/08/2019 1247   ALKPHOS 134 (H) 08/08/2019 1247   BILITOT 0.3 08/08/2019 1247   BILITOT 0.2 05/24/2018 0851   GFRNONAA >60 08/08/2019 1247   GFRAA >60 08/08/2019 1247    No results found for: SPEP, UPEP  Lab Results  Component Value Date   WBC 3.7 (L) 08/08/2019   NEUTROABS 1.9 08/08/2019   HGB 10.3 (L) 08/08/2019   HCT 31.4 (L) 08/08/2019   MCV 105.7 (H) 08/08/2019   PLT 43 (L) 08/08/2019      Chemistry      Component Value Date/Time   NA 139 08/08/2019 1247   NA 139 06/27/2018 1649   K 4.1 08/08/2019 1247   CL 103 08/08/2019 1247   CO2 26  08/08/2019 1247   BUN 15 08/08/2019 1247   BUN 17 06/27/2018 1649   CREATININE 0.83 08/08/2019 1247      Component Value Date/Time   CALCIUM 9.2 08/08/2019 1247   ALKPHOS 134 (H) 08/08/2019 1247   AST 44 (H) 08/08/2019 1247   ALT 33 08/08/2019 1247   BILITOT 0.3 08/08/2019 1247   BILITOT 0.2 05/24/2018 0851       RADIOGRAPHIC STUDIES: I have personally reviewed the radiological images as listed and agreed with the findings in the report. Dg Chest Port 1 View  Result Date: 08/01/2019 CLINICAL DATA:  Daughter states that patient had fever and confusion since yesterday. Had cancer treatment on Monday. PT HX: non smoker EXAM: PORTABLE CHEST 1 VIEW COMPARISON:  07/17/2019 FINDINGS: Cardiac silhouette normal in size and configuration. No mediastinal or hilar masses or convincing adenopathy. Clear lungs.  No pleural effusion or pneumothorax. Stable right anterior chest wall Port-A-Cath. Skeletal structures are demineralized but grossly intact. IMPRESSION: No acute cardiopulmonary disease. Electronically Signed   By: Lajean Manes M.D.   On: 08/01/2019 19:23   Dg Chest Port 1 View  Result Date: 07/17/2019 CLINICAL DATA:  Hypoxemia, confusion and fatigue. Undergoing chemotherapy for GYN malignancy. EXAM: PORTABLE CHEST 1 VIEW COMPARISON:  Radiographs 12/31/2018.  Abdominal CT 07/05/2019. FINDINGS: 1141 hours. There are lower lung volumes. Right IJ Port-A-Cath tip is unchanged at the lower SVC level. The heart size and mediastinal contours are stable for the lesser degree of inspiration. There is mild bibasilar atelectasis, vascular congestion and possible mild edema. No confluent airspace opacity, significant pleural effusion or pneumothorax. The bones appear unchanged. IMPRESSION: Lower lung volumes with bibasilar atelectasis, vascular congestion and possible mild pulmonary edema. No confluent airspace opacity. Electronically Signed   By: Richardean Sale M.D.   On: 07/17/2019 11:58   Vas [place] Lower  Extremity Venous (dvt)  Result Date: 08/02/2019  Lower Venous Study Indications: Cellulitis.  Risk Factors: None identified. Comparison Study: No prior studies. Performing Technologist: Oliver Hum RVT  Examination Guidelines: A complete evaluation includes B-mode imaging, spectral Doppler, color Doppler, and power Doppler as needed of all accessible portions  of each vessel. Bilateral testing is considered an integral part of a complete examination. Limited examinations for reoccurring indications may be performed as noted.  +-----+---------------+---------+-----------+----------+--------------+ RIGHTCompressibilityPhasicitySpontaneityPropertiesThrombus Aging +-----+---------------+---------+-----------+----------+--------------+ CFV  Full           Yes      Yes                                 +-----+---------------+---------+-----------+----------+--------------+   +---------+---------------+---------+-----------+----------+--------------+ LEFT     CompressibilityPhasicitySpontaneityPropertiesThrombus Aging +---------+---------------+---------+-----------+----------+--------------+ CFV      Full           Yes      Yes                                 +---------+---------------+---------+-----------+----------+--------------+ SFJ      Full                                                        +---------+---------------+---------+-----------+----------+--------------+ FV Prox  Full                                                        +---------+---------------+---------+-----------+----------+--------------+ FV Mid   Full                                                        +---------+---------------+---------+-----------+----------+--------------+ FV DistalFull                                                        +---------+---------------+---------+-----------+----------+--------------+ PFV      Full                                                         +---------+---------------+---------+-----------+----------+--------------+ POP      Full           Yes      Yes                                 +---------+---------------+---------+-----------+----------+--------------+ PTV      Full                                                        +---------+---------------+---------+-----------+----------+--------------+ PERO     Full                                                        +---------+---------------+---------+-----------+----------+--------------+  Summary: Right: No evidence of common femoral vein obstruction. Left: There is no evidence of deep vein thrombosis in the lower extremity. No cystic structure found in the popliteal fossa.  *See table(s) above for measurements and observations. Electronically signed by Monica Martinez MD on 08/02/2019 at 4:36:22 PM.    Final     All questions were answered. The patient knows to call the clinic with any problems, questions or concerns. No barriers to learning was detected.  I spent 70 minutes counseling the patient face to face. The total time spent in the appointment was 80 minutes and more than 50% was on counseling and review of test results  Heath Lark, MD 08/08/2019 2:36 PM

## 2019-08-08 NOTE — Assessment & Plan Note (Signed)
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

## 2019-08-08 NOTE — Telephone Encounter (Signed)
Called referral for palliative care to [place] [place].

## 2019-08-09 ENCOUNTER — Telehealth: Payer: Self-pay

## 2019-08-09 ENCOUNTER — Encounter: Payer: Self-pay | Admitting: Hematology and Oncology

## 2019-08-09 ENCOUNTER — Other Ambulatory Visit: Payer: Self-pay | Admitting: Hematology and Oncology

## 2019-08-09 LAB — CA 125: Cancer Antigen (CA) 125: 458 U/mL — ABNORMAL HIGH (ref 0.0–38.1)

## 2019-08-09 MED ORDER — MORPHINE SULFATE 15 MG PO TABS: 15.0000 mg | ORAL_TABLET | ORAL | 0 refills | Status: DC | PRN

## 2019-08-09 MED FILL — MORPHINE SULFATE IR 15 MG T: 15 | 15 days supply | Qty: 90 | Fill #0

## 2019-08-09 NOTE — Telephone Encounter (Signed)
-----   Message from Heath Lark, MD sent at 08/09/2019  8:04 AM EDT ----- Regarding: PLs call son or daughter and let them know CA-125 is high  ----- Message ----- From: Interface, [facility] Sent: 08/08/2019   1:12 PM EDT To: Heath Lark, MD

## 2019-08-09 NOTE — Telephone Encounter (Signed)
Called and given below message. She verbalized understanding. 

## 2019-08-12 ENCOUNTER — Ambulatory Visit: Payer: Medicare Other

## 2019-08-12 ENCOUNTER — Other Ambulatory Visit: Payer: Medicare Other

## 2019-08-12 ENCOUNTER — Ambulatory Visit: Payer: Medicare Other | Admitting: Hematology and Oncology

## 2019-08-13 ENCOUNTER — Telehealth: Payer: Self-pay

## 2019-08-13 NOTE — Telephone Encounter (Signed)
[facility] called and left a message they are unable to provide palliative care services due to Ms. Lisa Moyer not being a part of the the [employer]. [name] would still like a referral for palliative care.  Referral called to [facility] for palliative care. They will contact family regarding referral.

## 2019-08-19 ENCOUNTER — Telehealth: Payer: Self-pay | Admitting: *Deleted

## 2019-08-19 NOTE — Telephone Encounter (Signed)
Patient's daughter calling for a refill of MS Contin 30mg  tablets. She takes this three times a day now and needs  A refill to have the new directions on it.   Returned call to [facility]- Patient was actually taking percocet three times a day and the MS contin 3 times a day. She tried the [facility] 15 and it put her to sleep and she was having crazy dreams. She states patient does receive good relief with the percocet. She will need the refill on Thursday.

## 2019-08-20 ENCOUNTER — Telehealth: Payer: Self-pay

## 2019-08-20 ENCOUNTER — Encounter: Payer: Self-pay | Admitting: Hematology and Oncology

## 2019-08-20 NOTE — Telephone Encounter (Signed)
Phone call placed to patient's daughter to introduce Palliative care and to schedule visit with NP. Verbal consent obtained for Palliative Care. Visit scheduled for 08/23/2019 with Stanton Kidney NP

## 2019-08-21 ENCOUNTER — Telehealth: Payer: Self-pay | Admitting: Oncology

## 2019-08-21 NOTE — Telephone Encounter (Signed)
Left a message for Lisa Moyer to see if he or [name] can attend patient's appointment with Dr. Alvy Bimler tomorrow.  Requested a return call.

## 2019-08-22 ENCOUNTER — Encounter: Payer: Self-pay | Admitting: Hematology and Oncology

## 2019-08-22 ENCOUNTER — Inpatient Hospital Stay: Payer: Medicare Other

## 2019-08-22 ENCOUNTER — Other Ambulatory Visit: Payer: Self-pay

## 2019-08-22 ENCOUNTER — Inpatient Hospital Stay (HOSPITAL_BASED_OUTPATIENT_CLINIC_OR_DEPARTMENT_OTHER): Payer: Medicare Other | Admitting: Hematology and Oncology

## 2019-08-22 ENCOUNTER — Telehealth: Payer: Self-pay | Admitting: Hematology and Oncology

## 2019-08-22 ENCOUNTER — Other Ambulatory Visit: Payer: Self-pay | Admitting: *Deleted

## 2019-08-22 VITALS — BP 112/49 | HR 58 | Temp 98.5°F | Resp 18 | Ht 64.0 in | Wt 156.8 lb

## 2019-08-22 DIAGNOSIS — E039 Hypothyroidism, unspecified: Secondary | ICD-10-CM

## 2019-08-22 DIAGNOSIS — C5700 Malignant neoplasm of unspecified fallopian tube: Secondary | ICD-10-CM

## 2019-08-22 DIAGNOSIS — R1030 Lower abdominal pain, unspecified: Secondary | ICD-10-CM

## 2019-08-22 DIAGNOSIS — C5701 Malignant neoplasm of right fallopian tube: Secondary | ICD-10-CM | POA: Diagnosis not present

## 2019-08-22 DIAGNOSIS — D539 Nutritional anemia, unspecified: Secondary | ICD-10-CM

## 2019-08-22 DIAGNOSIS — R64 Cachexia: Secondary | ICD-10-CM | POA: Insufficient documentation

## 2019-08-22 DIAGNOSIS — R112 Nausea with vomiting, unspecified: Secondary | ICD-10-CM | POA: Diagnosis not present

## 2019-08-22 DIAGNOSIS — D6481 Anemia due to antineoplastic chemotherapy: Secondary | ICD-10-CM

## 2019-08-22 DIAGNOSIS — Z7189 Other specified counseling: Secondary | ICD-10-CM

## 2019-08-22 DIAGNOSIS — G893 Neoplasm related pain (acute) (chronic): Secondary | ICD-10-CM | POA: Diagnosis not present

## 2019-08-22 DIAGNOSIS — T451X5A Adverse effect of antineoplastic and immunosuppressive drugs, initial encounter: Secondary | ICD-10-CM

## 2019-08-22 DIAGNOSIS — I10 Essential (primary) hypertension: Secondary | ICD-10-CM

## 2019-08-22 LAB — CBC WITH DIFFERENTIAL/PLATELET
Abs Immature Granulocytes: 0.03 10*3/uL (ref 0.00–0.07)
Basophils Absolute: 0 10*3/uL (ref 0.0–0.1)
Basophils Relative: 1 %
Eosinophils Absolute: 0.1 10*3/uL (ref 0.0–0.5)
Eosinophils Relative: 2 %
HCT: 30.2 % — ABNORMAL LOW (ref 36.0–46.0)
Hemoglobin: 9.8 g/dL — ABNORMAL LOW (ref 12.0–15.0)
Immature Granulocytes: 1 %
Lymphocytes Relative: 23 %
Lymphs Abs: 1.1 10*3/uL (ref 0.7–4.0)
MCH: 35.5 pg — ABNORMAL HIGH (ref 26.0–34.0)
MCHC: 32.5 g/dL (ref 30.0–36.0)
MCV: 109.4 fL — ABNORMAL HIGH (ref 80.0–100.0)
Monocytes Absolute: 0.7 10*3/uL (ref 0.1–1.0)
Monocytes Relative: 14 %
Neutro Abs: 2.8 10*3/uL (ref 1.7–7.7)
Neutrophils Relative %: 59 %
Platelets: 286 10*3/uL (ref 150–400)
RBC: 2.76 MIL/uL — ABNORMAL LOW (ref 3.87–5.11)
RDW: 16.3 % — ABNORMAL HIGH (ref 11.5–15.5)
WBC: 4.7 10*3/uL (ref 4.0–10.5)
nRBC: 0 % (ref 0.0–0.2)

## 2019-08-22 LAB — COMPREHENSIVE METABOLIC PANEL
ALT: 7 U/L (ref 0–44)
AST: 15 U/L (ref 15–41)
Albumin: 2.9 g/dL — ABNORMAL LOW (ref 3.5–5.0)
Alkaline Phosphatase: 88 U/L (ref 38–126)
Anion gap: 9 (ref 5–15)
BUN: 21 mg/dL (ref 8–23)
CO2: 22 mmol/L (ref 22–32)
Calcium: 8.9 mg/dL (ref 8.9–10.3)
Chloride: 107 mmol/L (ref 98–111)
Creatinine, Ser: 0.95 mg/dL (ref 0.44–1.00)
GFR calc Af Amer: >60 mL/min (ref >60)
GFR calc non Af Amer: 56 mL/min — ABNORMAL LOW (ref >60)
Glucose, Bld: 116 mg/dL — ABNORMAL HIGH (ref 70–99)
Potassium: 4.5 mmol/L (ref 3.5–5.1)
Sodium: 138 mmol/L (ref 135–145)
Total Bilirubin: 0.2 mg/dL — ABNORMAL LOW (ref 0.3–1.2)
Total Protein: 6.9 g/dL (ref 6.5–8.1)

## 2019-08-22 LAB — TSH: TSH: 0.124 u[IU]/mL — ABNORMAL LOW (ref 0.308–3.960)

## 2019-08-22 LAB — SAMPLE TO BLOOD BANK

## 2019-08-22 MED ORDER — DEXAMETHASONE 2 MG PO TABS: 2.0000 mg | ORAL_TABLET | Freq: Every day | ORAL | 0 refills | Status: DC

## 2019-08-22 MED ORDER — MORPHINE SULFATE ER 30 MG PO TBCR: 30.0000 mg | EXTENDED_RELEASE_TABLET | Freq: Three times a day (TID) | ORAL | 0 refills | Status: DC

## 2019-08-22 MED ORDER — LEVOTHYROXINE SODIUM 25 MCG PO TABS: 25.0000 ug | ORAL_TABLET | Freq: Every day | ORAL | 1 refills | Status: AC

## 2019-08-22 NOTE — Assessment & Plan Note (Signed)
The patient continues to have ongoing abdominal symptoms consistent with disease progression The patient is at peace of not going for further chemotherapy We will continue symptom management only I do not recommend surveillance imaging study as it will not help with decision-making and could potentially hurt her

## 2019-08-22 NOTE — Assessment & Plan Note (Signed)
Her blood pressure fluctuated widely I am seeing some low blood pressure The patient have excessive fatigue Recommend discontinuation of losartan She will continue atenolol and amlodipine for now

## 2019-08-22 NOTE — Assessment & Plan Note (Signed)
Her abdominal pain is multifactorial We discussed chronic pain management along with laxative therapy

## 2019-08-22 NOTE — Assessment & Plan Note (Signed)
We had numerous goals of care discussions We discussed prognosis, typically less than 6 months The patient accepted DNR She is currently followed closely by palliative care team at home I will continue to see her every 2 weeks for symptom management and supportive care It is very important to her for the patient to remain at home until the end of life and we will try to provide are supportive care at home

## 2019-08-22 NOTE — Telephone Encounter (Signed)
Based on lab results patient's dose of Synthroid needs to be decreased. Confirmed daily dose has been 26mcg. Per Dr. Alvy Bimler decrease to 68mcg. New prescription sent to the pharmacy. Patient's daughter aware

## 2019-08-22 NOTE — Assessment & Plan Note (Signed)
We discussed a trial of low-dose oral dexamethasone daily to help improve appetite and energy

## 2019-08-22 NOTE — Assessment & Plan Note (Signed)
We have significant difficulties trying to get her pain under control With each dose change of morphine sulfate, the patient tends to develop confusion/hallucinations/excessive sedation However, if she is taking less pain medication, her pain will be poorly controlled I am concerned that some of her pain could be related to bowel obstructive symptoms We have extensive discussions about chronic laxative therapy I recommend continue MS Contin 30 mg twice a day and to continue breakthrough IR morphine 15 mg as needed A refill her prescription MS Contin today

## 2019-08-22 NOTE — Telephone Encounter (Signed)
Confirmed 11/5 appointment with dtr.

## 2019-08-22 NOTE — Progress Notes (Signed)
[place] OFFICE PROGRESS NOTE  Patient Care Team: Patient, No Pcp Per as PCP - General ([place]) Ileana Roup, MD as Consulting Physician (General Surgery)  ASSESSMENT & PLAN:  Fallopian tube cancer, carcinoma ([place]) The patient continues to have ongoing abdominal symptoms consistent with disease progression The patient is at peace of not going for further chemotherapy We will continue symptom management only I do not recommend surveillance imaging study as it will not help with decision-making and could potentially hurt her  Cancer associated pain We have significant difficulties trying to get her pain under control With each dose change of morphine sulfate, the patient tends to develop confusion/hallucinations/excessive sedation However, if she is taking less pain medication, her pain will be poorly controlled I am concerned that some of her pain could be related to bowel obstructive symptoms We have extensive discussions about chronic laxative therapy I recommend continue MS Contin 30 mg twice a day and to continue breakthrough IR morphine 15 mg as needed A refill her prescription MS Contin today  Abdominal pain Her abdominal pain is multifactorial We discussed chronic pain management along with laxative therapy   Malignant cachexia ([place]) We discussed a trial of low-dose oral dexamethasone daily to help improve appetite and energy  Essential hypertension Her blood pressure fluctuated widely I am seeing some low blood pressure The patient have excessive fatigue Recommend discontinuation of losartan She will continue atenolol and amlodipine for now  Goals of care, counseling/discussion We had numerous goals of care discussions We discussed prognosis, typically less than 6 months The patient accepted DNR She is currently followed closely by palliative care team at home I will continue to see her every 2 weeks for symptom management and  supportive care It is very important to her for the patient to remain at home until the end of life and we will try to provide are supportive care at home   No orders of the defined types were placed in this encounter.   INTERVAL HISTORY: Please see below for problem oriented charting. She returns with her daughter today.  The patient has poor hearing and some language barrier She has palliative care service establish at home We spent more than 50% of our time today discussing about pain management Daughter has intermittently woke the patient up from sleep in the middle of the night to try to stay ahead of pain management In the past, she have developed confusion and excessive sedation with higher dose of pain medicine Once we reduce the MS Contin a little bit, she will have occasional breakthrough pain medicine in the middle of the night We reviewed the schedule when she takes her pain medicine extensively Her blood pressure over the last few weeks also tend to fluctuate up and down significantly She has poor appetite She denies nausea although the patient continues to hang onto a nausea bag today There were no reported recent falls No recent fever or chills She is spending more than 12 hours lying down in bed or resting She has intermittent constipation alternate with loose bowel movement  SUMMARY OF ONCOLOGIC HISTORY: Oncology History Overview Note  Neg genetics.  HRD test was done on peritoneal fluid from 07/18/2018: HRD +   Fallopian tube cancer, carcinoma ([place])  06/18/2016 Tumor Marker   Patient's tumor was tested for the following markers: CA-125 Results of the tumor marker test revealed 298.   06/23/2016 PET scan   Diffuse hypermetabolic peritoneal carcinomatosis in the abdomen and pelvis,  internal mammary adenopathy in the chest, hypermetabolic right paracardiac/diaphragmatic LN, bilateral pleural effusion and moderate ascites   06/24/2016 Procedure   Therapeutic  paracentesis   06/24/2016 Pathology Results   Positive for adenocarcinoma, [place] primary   06/28/2016 Procedure   She underwent CT guided biopsy of omentum   06/28/2016 Pathology Results   High grade serous involving the omentum, positive for p53, PAX8, WT1 and p16.   07/01/2016 Procedure   Findings/impression:   1. Sonographic evaluation of the right internal jugular vein confirms patency. Sonography was required to gain central venous access. An image documenting patency and needle access was saved.   2. Successful placement of right internal jugular vein subcutaneous Mediport as described. Spot film shows the catheter tip at the cavoatrial junction. No pneumothorax. The Mediport aspirates and flushes normally.   07/02/2016 - 08/12/2016 Chemotherapy   The patient had 3 cycles of carboplatin and taxol chemotherapy treatment.     08/29/2016 Imaging   Ct scan of chest, abdomen and pelvis showed marked improvement and resolution of thoracic adenopathy   09/08/2016 Surgery   She underwent interval debulking surgery with TAH/BSO and infracolic omentectomy by Dr. Wynelle Cleveland   09/08/2016 Pathology Results   Right tube: scant serous carcinoma. No viable tumor. Left tube and ovary: normal. Omentum: low volume serous carcinoma with few psamomma bodies.   10/07/2016 - 11/18/2016 Chemotherapy   The patient had 3 more cycles of carboplatin and taxol for chemotherapy treatment.     02/07/2017 Imaging   Impression:  1. Colonic diverticulosis without acute diverticulitis. 2. Stable calcification right kidney without hydronephrosis. 3. No acute process or significant interval change has occurred since August 29, 2016.   05/09/2018 Imaging   Impression:  Findings are most suspicious for acute sigmoid colonic diverticulitis with intraloop abscess formation.   05/14/2018 Imaging   Impression: The pelvic fluid collection contiguous with sigmoid colonic diverticulitis changes has not  significant change since May 09, 2018. No new fluid collections are seen   06/28/2018 Imaging   IMPRESSION: Changes consistent with diverticulitis with free fluid within the pelvis. No focal contained abscess is seen. A previously noted fluid collection deep within the pelvis has resolved in the interval.   Stable nonobstructing right renal stone.  The remainder of the exam is stable from the prior study.    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 There is omental caking as well as stranding within various areas of the peritoneal fat. There is also wall thickening of the sigmoid colon with adjacent stranding. This finding can also be related to peritoneal carcinomatosis and implants although an inflammatory process of the sigmoid colon is not excluded.   Right nephrolithiasis.  Ileus pattern.   07/16/2018 Imaging   Impression:   Findings of peritoneal carcinomatosis, rapidly progressive. Not present in July 2019, progressed when compared to September 13.  Multiple bowel loops are distorted but no distention or transition zone to suggest a component of active obstruction.   07/18/2018 Procedure   Findings/impression:  1. Sonographic evaluation of ascites. Sonography was required to gain access to ascites. An image documenting ascites was saved. 2. Successful ultrasound guided paracentesis using a temporary peritoneal drainage catheter inserted in the right upper quadrant as described yielding 600cc of ascites.    07/20/2018 Echocardiogram   General:  The patient was in normal sinus rhythm.  Left Ventricle:  Left ventricular ejection fraction was normal, estimated in the range of 60 to 65%. The left ventricular cavity size appears normal. The left ventricular wall thickness appears normal. The left ventricle is thickened  in a fashion  consistent with mild concentric hypertrophy. Doppler tissue velocities and mitral inflow profile are consistent with Stage I diastolic dysfunction. The calculated ejection fraction is 68.1 %.  Right Ventricle:  The right ventricle appears normal in size and function.  Left Atrium:  The left atrium appears normal.  Right Atrium:  The right atrium appears normal.  Aortic Valve:  The structure of the aortic valve is tricuspid. There is evidence of trivial (trace) aortic regurgitation.  Mitral Valve:  There is no evidence of mitral regurgitation. The mitral valve appears normal in structure.  Pulmonic Valve:  There is evidence of trace (trivial) pulmonic regurgitation. The pulmonic valve appears normal in structure.  Tricuspid Valve:  There is evidence of trace (trivial) tricuspid regurgitation. The tricuspid valve appears normal in structure.  Pericardium:  There is no evidence of pericardial effusion.  Aorta:  The visualized portions of the aorta (ascending aorta, aortic root, and aortic arch) appear normal.  Pulmonic Artery:  Unable to obtain RVSP due to insufficient tricuspid regurgitation jet.  Conclusions: Left ventricular ejection fraction was normal, estimated in the range of 60 to 65%. The left ventricular cavity size appears normal. The left ventricular wall thickness appears normal. The left ventricle is thickened in a fashion consistent with mild concentric hypertrophy. Doppler tissue velocities and mitral inflow profile are consistent with Stage I diastolic dysfunction. The calculated ejection fraction is 68.1%.There is evidence of trivial (trace) aortic regurgitation.There is evidence of trace (trivial) pulmonic regurgitation.There is evidence of trace (trivial) tricuspid regurgitation.There is no evidence of pericardial effusion.Unable to obtain RVSP due to insufficient tricuspid regurgitation jet.   07/23/2018 - 12/25/2018 Chemotherapy   The patient had carboplatin,  doxil and Avastin for chemotherapy treatment x 6 cycles   08/01/2018 Imaging   Ct head showed no injuries.   08/01/2018 Procedure   Findings/impression:  1. Sonographic evaluation of ascites. Sonography was required to gain access to ascites. An image documenting ascites was saved. 2. Successful ultrasound guided paracentesis using a temporary peritoneal drainage catheter inserted in the right upper quadrant as described yielding 400cc of ascites.    08/06/2018 Tumor Marker   Patient's tumor was tested for the following markers: CA-125 Results of the tumor marker test revealed 373   08/20/2018 Tumor Marker   Patient's tumor was tested for the following markers: CA-125 Results of the tumor marker test revealed 51.   10/03/2018 Genetic Testing   Patient has genetic testing done for genetics testing using her saliva. 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  Liver and spleen homogeneously enhance. No lesion seen.  The adjacent gallbladder is surgically absent.  The adrenal glands, kidneys, pancreas are normal.  Pelvic contents are remarkable for an absent uterus.  The mesenteric stranding has nearly completely resolved. Minimal reticular prominence throughout the pelvis.  No residual ascites.  No bowel wall thickening or. No dilated loops or transition zone.  The appendix is not seen. But no inflammatory changes in the right lower quadrant.  Osseous structures are intact.   11/27/2018 Imaging   Ct scan abdomen and pelvis showed colonic diverticulosis and nonspecific fluid levels in the bowel   12/24/2018 Cancer Staging   Staging  form: Ovary, Fallopian Tube, and Primary Peritoneal Carcinoma, AJCC 8th Edition - Pathologic: Stage IVB (rpT3a, pN0, pM1b) - Signed by Heath Lark, MD on 12/24/2018   12/31/2018 Tumor Marker   Patient's tumor was tested for the following markers:  CA-125 Results of the tumor marker test revealed 14.4   01/01/2019 Imaging   1. LEFT jugular Port-A-Cath catheter tip projects over the mid SVC. 2. No acute cardiopulmonary disease.    01/04/2019 Echocardiogram   1. The left ventricle has normal systolic function with an ejection fraction of 60-65%. The cavity size was normal. Left ventricular diastolic Doppler parameters are consistent with impaired relaxation.  2. The right ventricle has normal systolic function. The cavity was normal. There is no increase in right ventricular wall thickness.  3. The mitral valve is normal in structure.  4. The tricuspid valve is normal in structure.  5. The aortic valve is normal in structure.  6. The pulmonic valve was normal in structure.  7. 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    02/13/2019 -  Chemotherapy   The patient is started on Lynparza due to [place]    04/22/2019 Tumor Marker   Patient's tumor was tested for the following markers: CA-125 Results of the tumor marker test revealed 44.8   04/26/2019 Imaging   1. No evidence of recurrent or metastatic carcinoma within the abdomen or pelvis. 2. Colonic diverticulosis, without radiographic evidence of diverticulitis or other acute findings.   Aortic Atherosclerosis (ICD10-I70.0).   05/28/2019 Tumor Marker   Patient's tumor was tested for the following markers: CA-125 Results of the tumor marker test revealed 104   07/05/2019 Imaging   1. Imaging findings consistent with recurrent peritoneal carcinomatosis. This is most apparent within the left abdomen involving the transverse colon and descending colon where there is increased soft tissue, loculated fluid and colonic serosal soft tissue thickening. 2. Increase in size of retroperitoneal and pelvic lymph nodes compatible with metastatic adenopathy. 3. No evidence for bowel obstruction.     07/05/2019 Tumor Marker   Patient's tumor was tested for the following markers:  CA-125 Results of the tumor marker test revealed 233   07/15/2019 - 07/29/2019 Chemotherapy   The patient had 2 doses of gemzar, DC due to side-effects and progression    08/01/2019 - 08/03/2019 Hospital Admission   She was admitted to the hospital due to fever and pancytopenia She was placed on antibiotics treatment and received transfusion support   08/08/2019 Tumor Marker   Patient's tumor was tested for the following markers: CA-125 Results of the tumor marker test revealed 458     REVIEW OF SYSTEMS:   Constitutional: Denies fevers, chills  Eyes: Denies blurriness of vision Ears, nose, mouth, throat, and face: Denies mucositis or sore throat Respiratory: Denies cough, dyspnea or wheezes Cardiovascular: Denies palpitation, chest discomfort or lower extremity swelling Skin: Denies abnormal skin rashes Lymphatics: Denies new lymphadenopathy or easy bruising Behavioral/Psych: Mood is stable, no new changes  All other systems were reviewed with the patient and are negative.  I have reviewed the past medical history, past surgical history, social history and family history with the patient and they are unchanged from previous note.  ALLERGIES:  is allergic to latex.  MEDICATIONS:  Current Outpatient Medications  Medication Sig Dispense Refill  . acetaminophen (TYLENOL) 500 MG tablet Take 1,000 mg by mouth every 6 (six) hours as needed for moderate pain.    Marland Kitchen amLODipine (NORVASC) 5 MG tablet Take 1  tablet (5 mg total) by mouth daily. 30 tablet 11  . atenolol (TENORMIN) 25 MG tablet Take 0.5 tablets (12.5 mg total) by mouth daily. (Patient taking differently: Take 25 mg by mouth daily. ) 30 tablet 11  . Cholecalciferol (VITAMIN D PO) Take 1 tablet by mouth daily. D3 1000units    . dexamethasone (DECADRON) 2 MG tablet Take 1 tablet (2 mg total) by mouth daily. 30 tablet 0  . levothyroxine (SYNTHROID) 50 MCG tablet Take 0.5 tablets (25 mcg total) by mouth daily before breakfast. (Patient  taking differently: Take 50 mcg by mouth daily before breakfast. ) 30 tablet 3  . morphine (MS CONTIN) 30 MG 12 hr tablet Take 1 tablet (30 mg total) by mouth every 8 (eight) hours. 90 tablet 0  . morphine (MSIR) 15 MG tablet Take 1 tablet (15 mg total) by mouth every 4 (four) hours as needed for severe pain. 90 tablet 0  . Multiple Vitamin (MULTIVITAMIN) tablet Take 1 tablet by mouth daily.    . ondansetron (ZOFRAN ODT) 8 MG disintegrating tablet Take 1 tablet (8 mg total) by mouth every 8 (eight) hours as needed for nausea or vomiting. 30 tablet 3  . ondansetron (ZOFRAN) 8 MG tablet Take 1 tablet (8 mg total) by mouth every 8 (eight) hours as needed for nausea or vomiting. 20 tablet 0  . polyethylene glycol (MIRALAX / GLYCOLAX) 17 g packet Take 17 g by mouth daily.      No current facility-administered medications for this visit.     PHYSICAL EXAMINATION: ECOG PERFORMANCE STATUS: 3 - Symptomatic, >50% confined to bed  Vitals:   08/22/19 1217  BP: (!) 112/49  Pulse: (!) 58  Resp: 18  Temp: 98.5 F (36.9 C)  SpO2: 94%   Filed Weights   08/22/19 1217  Weight: 156 lb 12.8 oz (71.1 kg)    GENERAL:alert, no distress and comfortable Musculoskeletal:no cyanosis of digits and no clubbing  NEURO: alert & oriented x 3 with fluent speech, no focal motor/sensory deficits  LABORATORY DATA:  I have reviewed the data as listed    Component Value Date/Time   NA 138 08/22/2019 1157   NA 139 06/27/2018 1649   K 4.5 08/22/2019 1157   CL 107 08/22/2019 1157   CO2 22 08/22/2019 1157   GLUCOSE 116 (H) 08/22/2019 1157   BUN 21 08/22/2019 1157   BUN 17 06/27/2018 1649   CREATININE 0.95 08/22/2019 1157   CALCIUM 8.9 08/22/2019 1157   PROT 6.9 08/22/2019 1157   PROT 6.8 05/24/2018 0851   ALBUMIN 2.9 (L) 08/22/2019 1157   ALBUMIN 4.2 05/24/2018 0851   AST 15 08/22/2019 1157   ALT 7 08/22/2019 1157   ALKPHOS 88 08/22/2019 1157   BILITOT 0.2 (L) 08/22/2019 1157   BILITOT 0.2 05/24/2018 0851    GFRNONAA 56 (L) 08/22/2019 1157   GFRAA >60 08/22/2019 1157    No results found for: SPEP, UPEP  Lab Results  Component Value Date   WBC 4.7 08/22/2019   NEUTROABS 2.8 08/22/2019   HGB 9.8 (L) 08/22/2019   HCT 30.2 (L) 08/22/2019   MCV 109.4 (H) 08/22/2019   PLT 286 08/22/2019      Chemistry      Component Value Date/Time   NA 138 08/22/2019 1157   NA 139 06/27/2018 1649   K 4.5 08/22/2019 1157   CL 107 08/22/2019 1157   CO2 22 08/22/2019 1157   BUN 21 08/22/2019 1157   BUN 17 06/27/2018 1649  CREATININE 0.95 08/22/2019 1157      Component Value Date/Time   CALCIUM 8.9 08/22/2019 1157   ALKPHOS 88 08/22/2019 1157   AST 15 08/22/2019 1157   ALT 7 08/22/2019 1157   BILITOT 0.2 (L) 08/22/2019 1157   BILITOT 0.2 05/24/2018 0851       RADIOGRAPHIC STUDIES: I have personally reviewed the radiological images as listed and agreed with the findings in the report. Dg Chest Port 1 View  Result Date: 08/01/2019 CLINICAL DATA:  Daughter states that patient had fever and confusion since yesterday. Had cancer treatment on Monday. PT HX: non smoker EXAM: PORTABLE CHEST 1 VIEW COMPARISON:  07/17/2019 FINDINGS: Cardiac silhouette normal in size and configuration. No mediastinal or hilar masses or convincing adenopathy. Clear lungs.  No pleural effusion or pneumothorax. Stable right anterior chest wall Port-A-Cath. Skeletal structures are demineralized but grossly intact. IMPRESSION: No acute cardiopulmonary disease. Electronically Signed   By: Lajean Manes M.D.   On: 08/01/2019 19:23   Vas [place] Lower Extremity Venous (dvt)  Result Date: 08/02/2019  Lower Venous Study Indications: Cellulitis.  Risk Factors: None identified. Comparison Study: No prior studies. Performing Technologist: Oliver Hum RVT  Examination Guidelines: A complete evaluation includes B-mode imaging, spectral Doppler, color Doppler, and power Doppler as needed of all accessible portions of each vessel. Bilateral  testing is considered an integral part of a complete examination. Limited examinations for reoccurring indications may be performed as noted.  +-----+---------------+---------+-----------+----------+--------------+ RIGHTCompressibilityPhasicitySpontaneityPropertiesThrombus Aging +-----+---------------+---------+-----------+----------+--------------+ CFV  Full           Yes      Yes                                 +-----+---------------+---------+-----------+----------+--------------+   +---------+---------------+---------+-----------+----------+--------------+ LEFT     CompressibilityPhasicitySpontaneityPropertiesThrombus Aging +---------+---------------+---------+-----------+----------+--------------+ CFV      Full           Yes      Yes                                 +---------+---------------+---------+-----------+----------+--------------+ SFJ      Full                                                        +---------+---------------+---------+-----------+----------+--------------+ FV Prox  Full                                                        +---------+---------------+---------+-----------+----------+--------------+ FV Mid   Full                                                        +---------+---------------+---------+-----------+----------+--------------+ FV DistalFull                                                        +---------+---------------+---------+-----------+----------+--------------+  PFV      Full                                                        +---------+---------------+---------+-----------+----------+--------------+ POP      Full           Yes      Yes                                 +---------+---------------+---------+-----------+----------+--------------+ PTV      Full                                                        +---------+---------------+---------+-----------+----------+--------------+  PERO     Full                                                        +---------+---------------+---------+-----------+----------+--------------+     Summary: Right: No evidence of common femoral vein obstruction. Left: There is no evidence of deep vein thrombosis in the lower extremity. No cystic structure found in the popliteal fossa.  *See table(s) above for measurements and observations. Electronically signed by Monica Martinez MD on 08/02/2019 at 4:36:22 PM.    Final     All questions were answered. The patient knows to call the clinic with any problems, questions or concerns. No barriers to learning was detected.  I spent 40 minutes counseling the patient face to face. The total time spent in the appointment was 60 minutes and more than 50% was on counseling and review of test results  Heath Lark, MD 08/22/2019 1:17 PM

## 2019-08-23 ENCOUNTER — Other Ambulatory Visit: Payer: Self-pay | Admitting: Internal Medicine

## 2019-08-23 DIAGNOSIS — Z515 Encounter for palliative care: Secondary | ICD-10-CM

## 2019-08-23 NOTE — Progress Notes (Signed)
Oct 23rd, 2020 [facility] Palliative Care Consult Note Telephone: [phone]  Fax: [phone]  PATIENT NAME: Lisa Moyer DOB: 05/31/1935 MRN: [id number] [place], [place]  PRIMARY CARE PROVIDER: none  REFERRING PROVIDER:  Heath Lark, MD [place],  [place] [id number]  RESPONSIBLE PARTY:   Patient's (dtr/HCPOA) Melida Quitter [phone], (son) Malachi Paradise [phone]  ASSESSMENT / RECOMMENDATIONS:  1. Advance Care Planning:  A. Directives: Living Will / HCPOA (#1 dtr Melida Quitter, #2 Malachi Paradise, #3 Nelia Shi) available within [facility] E[facility].  Patient, in presence of daughter Oneita Jolly, confirmed wish for DNR. I completed and uploaded into [facility] EPIC / [facility] EMR, and left originals with the family.  Reviewed, completed, and uploaded into [facility] EPIC / [facility] EMR MOST form. Details: DNR/DNR. Scope of Medical Intervention: Comfort. Yes to Antibiotics. IVFs for a defined tria periodl. No Feeding Tube.   B. Goals of Care:  -Understands incurable nature of her cancer, with no further therapeutic options available. Discussed scope of hospice services; patient / daughter wish referral   *I left a message at Dr. Filiberto Pinks office asking for hospice referral, and if Dr. Alvy Bimler would be agreeable to serve as patients attending under hospice services.   -To balance pain control without excessive somnolence  -Above all else, its very important to her that she have a home death, in her condo that she loves.  2. Symptom Management: A. Cancer associated pain (could be an element of obstructive symptoms per oncology note): Abdominal pain, improved management with MS Cont 30mg  bid, with  or morphine 15mg  IR q 2-4hours prn breakthrough pain. This regimen is working for now, for adequate pain control without excessive somnolence. Daughter offering food with pain meds to decrease associated nausea. Prn Zofran  available and uses to good effect.   -we discussed augmenting MSO4 IR with Tylenol 500mg ; limit total dose to 6 tabs/day.  B. Progressive weakness/somnolence: we discussed that the fatigue could be r/t opioid; should build up a tolerance to this side effect over time.   C. Constipation: opioid induced;  D. Malignant cachexia: Weight (08/22/2019) 156 lbs. At a height of 54 her BMI is 27 kg/m2. Patient consumes 25-33% of 3 meals/day. Was recently started on dexamethasone 2mg  qd to help improve appetite and energy. E. Essential hypertension: Losartin was recently discontinued. Daughter wishes to further decrease pill burden. Her SBP was 118 this am. I okayed patient to discontinue her low dose Norvasc and Atenolol. Daughter checks patients BP and HR daily. She can restart Norvasc prn for SBP greater than 130. Her resting HR is in the 50s, so I think shes tolerate absence of Atenolol okay. Daughter knows to watch for a potential rebound tachycardia from beta blocker withdrawal  3. Cognitive / Functional status: HOH. English is her second language; [place] is her primary. But her comprehension is good. Her decreased hearing plays a greater barrier to communication. She transfers / ambulates independently. Daughter assists with hygiene, dressing and toileting d/t patient fatigue. Sleeps through night and frequent daytime naps. Constipation managed with prn prune juice; can have diarrhea if drinks more than a cup of this a day.   -daughter wanting a wheelchair and bedside commode in anticipation of further decline. I mentioned these items can be readily provided through hospice program.  4. Family Supports / coping: Patients daughter (up from [place]) is staying with her at patients home; plans to remain for the duration. Daughter is  experiencing anticipatory grieving. Patients daughter, and patients home, are her biggest sources of strength and support.  5. Follow up Palliative Care Visit: Anticipate  hospice referral pnd Dr. Filiberto Pinks approval  I spent 60 minutes providing this consultation from 11:30amto 12:30pm. More than 50% of the time in this consultation was spent coordinating communication.   HISTORY OF PRESENT ILLNESS:  Lisa Moyer is a 84 y.o. 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 Debulking surgery with TAH / BSO Nov 2017; Initial and subsequent chemo regimens with improvement; now recurrent. No further treatment / surveillance recommended. Past paracentesis: Sept & Oct 2019, & Aug 2020.  Hospitalized 10/1-10/12/2018: antibiotics for fever / pancytopenia; Tx PRBCs H/O diverticulosis, HTN, HLD, and hypothyroidism,  Palliative Care was asked to help address goals of care.   CODE STATUS: DNR per Dr. Filiberto Pinks note  PPS: 40%  HOSPICE ELIGIBILITY/DIAGNOSIS: Yes / metastatic fallopian tube cancer  PAST MEDICAL HISTORY:  Past Medical History:  Diagnosis Date   Arthritis    mild   Blood transfusion without reported diagnosis    DDD (degenerative disc disease), lumbar 05/09/2018   CT shows multilevel degenerative hypertrophic changes worst at L2-3   Diverticulitis    Fallopian tube cancer, carcinoma ([place])    Fatty liver 06/28/2018   seen on CT   Hyperlipemia    Hypertension    Kidney stone 05/2018   Right 65mm nonobstructing renal stone seen on CT - pt asymptomatic   Pneumonia    Sliding hiatal hernia 06/28/2018   seen on CT    SOCIAL HX:  Social History   Tobacco Use   Smoking status: Never Smoker   Smokeless tobacco: Never Used  Substance Use Topics   Alcohol use: Never    Frequency: Never    ALLERGIES:  Allergies  Allergen Reactions   Latex Other (See Comments)    She has a sensitivity to [place] so prior drs told her to avoid latex and listed it as an allergy     PERTINENT MEDICATIONS:  Outpatient Encounter Medications  as of 08/23/2019  Medication Sig   acetaminophen (TYLENOL) 500 MG tablet Take 1,000 mg by mouth every 6 (six) hours as needed for moderate pain.   Cholecalciferol (VITAMIN D PO) Take 1 tablet by mouth daily. D3 1000units   dexamethasone (DECADRON) 2 MG tablet Take 1 tablet (2 mg total) by mouth daily.   levothyroxine (SYNTHROID) 25 MCG tablet Take 1 tablet (25 mcg total) by mouth daily before breakfast.   morphine (MS CONTIN) 30 MG 12 hr tablet Take 1 tablet (30 mg total) by mouth every 8 (eight) hours.   morphine (MSIR) 15 MG tablet Take 1 tablet (15 mg total) by mouth every 4 (four) hours as needed for severe pain. (Patient taking differently: Take 15 mg by mouth every 4 (four) hours as needed for severe pain. 1/2 tab (7.5mg ) q 2-4 hours)   Multiple Vitamin (MULTIVITAMIN) tablet Take 1 tablet by mouth daily.   ondansetron (ZOFRAN ODT) 8 MG disintegrating tablet Take 1 tablet (8 mg total) by mouth every 8 (eight) hours as needed for nausea or vomiting.   amLODipine (NORVASC) 5 MG tablet Take 1 tablet (5 mg total) by mouth daily. (Patient not taking: Reported on 08/24/2019)   atenolol (TENORMIN) 25 MG tablet Take 0.5 tablets (12.5 mg total) by mouth daily. (Patient not taking: Reported on 08/24/2019)   polyethylene glycol (MIRALAX / GLYCOLAX) 17 g  packet Take 17 g by mouth daily.    [DISCONTINUED] ondansetron (ZOFRAN) 8 MG tablet Take 1 tablet (8 mg total) by mouth every 8 (eight) hours as needed for nausea or vomiting.   No facility-administered encounter medications on file as of 08/23/2019.     PHYSICAL EXAM:   General: Well nourished, pleasantly conversant (HOH) older female, wincing with abdominal pain. Daughter is in attendance Abreaviated PE d/t wish to decrease potential COVID exposure. Extremities: no edema, no joint deformities Skin: no rashes Neurological: Weakness but otherwise nonfocal  Julianne Handler, NP

## 2019-08-24 ENCOUNTER — Encounter: Payer: Self-pay | Admitting: Internal Medicine

## 2019-08-24 NOTE — Progress Notes (Signed)
Oct 24th, 2020 [facility] Palliative Care Consult Note Telephone: [phone]  Fax: [phone]  This is a late entry, for patient telehealth visit 08/24/2019 Due to the current COVID-19 infection/crises, the family prefer, and have given their verbal consent for, a provider visit via telemedicine. HIPPA policies of confidentially were discussed. Video-audio (telehealth) contact was unable to be done due technical barriers from the patient's side.  PATIENT NAME: Lisa Moyer DOB: 20-Sep-1935 MRN: [id number] [place], [place] PROVIDER: none  REFERRING PROVIDER:  Heath Lark, MD [place],  [place] [id number]  RESPONSIBLE PARTY:   Patient's (dtr/HCPOA) Melida Quitter [phone], (son) Malachi Paradise [phone]  ASSESSMENT / RECOMMENDATIONS:  1. Goals of Care discussion:  TC from daughter [name] this morning; she is rethinking her decision to refer patient to hospice, in setting that her mom had a very good day yesterday; good pain management with MS Cont with prn MSO4 15gm for breakthrough pain. Some discomfort relieved with a good bowel movement. Likely energy boost/feeling better with contribution from low dose Decadron recently begun by Dr. Alvy Bimler. We discussed:  -holding off on hospice referral for now, pending patient/daughter reconsulting Dr. Alvy Bimler about potential therapeutic options; benefits vs burdens of chemo. Daughter believes she remembers from prior conversations that a lower dose chemo with fewer side effects may give patient a 20% chance of increasing her life by about 4 months or so, vs a stronger chemo with more side effects (including hair loss) which could buy her a 40% chance of increasing her life span.  -discussed importance of regular bowel movements setting of opioid use. Discussed use of Senna 1-3 tabs qd to bid; titrate to qd bowel movements.  -daughter call Dr.  Calton Dach office on Monday; request order for bed side commode/wheelchair. Also over the weekend, patient and daughter will decide if they will request an earlier f/u apt with Dr. Alvy Bimler to discuss options (scheduled to be seen in about 2 weeks or so).  5. Follow up Palliative Care: Daughter will call me this week to schedule visit  I spent 30 minutes providing this consultation from 9am to 9:30am. More than 50% of the time in this consultation was spent coordinating communication.   HISTORY OF PRESENT ILLNESS:  Lisa Moyer is a 84 y.o. 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 Debulking surgery with TAH / BSO Nov 2017; Initial and subsequent chemo regimens with improvement; now recurrent. No further treatment / surveillance recommended. Past paracentesis: Sept & Oct 2019, & Aug 2020.  Hospitalized 10/1-10/12/2018: antibiotics for fever / pancytopenia; Tx PRBCs H/O diverticulosis, HTN, HLD, and hypothyroidism,  Palliative Care was asked to help address goals of care.   CODE STATUS: DNR   PPS: 40%  HOSPICE ELIGIBILITY/DIAGNOSIS: Yes / metastatic fallopian tube cancer  PAST MEDICAL HISTORY:  Past Medical History:  Diagnosis Date  . Arthritis    mild  . Blood transfusion without reported diagnosis   . DDD (degenerative disc disease), lumbar 05/09/2018   CT shows multilevel degenerative hypertrophic changes worst at L2-3  . Diverticulitis   . Fallopian tube cancer, carcinoma ([place])   . Fatty liver 06/28/2018   seen on CT  . Hyperlipemia   . Hypertension   . Kidney stone 05/2018   Right 16mm nonobstructing renal stone seen on CT - pt asymptomatic  . Pneumonia   . Sliding hiatal  hernia 06/28/2018   seen on CT    SOCIAL HX:  Social History   Tobacco Use  . Smoking status: Never Smoker  . Smokeless tobacco: Never Used  Substance Use Topics  . Alcohol  use: Never    Frequency: Never    ALLERGIES:  Allergies  Allergen Reactions  . Latex Other (See Comments)    She has a sensitivity to [facility] so prior drs told her to avoid latex and listed it as an allergy     PERTINENT MEDICATIONS:  Outpatient Encounter Medications as of 08/26/2019  Medication Sig  . acetaminophen (TYLENOL) 500 MG tablet Take 1,000 mg by mouth every 6 (six) hours as needed for moderate pain.  Marland Kitchen amLODipine (NORVASC) 5 MG tablet Take 1 tablet (5 mg total) by mouth daily. (Patient not taking: Reported on 08/24/2019)  . atenolol (TENORMIN) 25 MG tablet Take 0.5 tablets (12.5 mg total) by mouth daily. (Patient not taking: Reported on 08/24/2019)  . Cholecalciferol (VITAMIN D PO) Take 1 tablet by mouth daily. D3 1000units  . dexamethasone (DECADRON) 2 MG tablet Take 1 tablet (2 mg total) by mouth daily.  Marland Kitchen levothyroxine (SYNTHROID) 25 MCG tablet Take 1 tablet (25 mcg total) by mouth daily before breakfast.  . morphine (MS CONTIN) 30 MG 12 hr tablet Take 1 tablet (30 mg total) by mouth every 8 (eight) hours.  Marland Kitchen morphine (MSIR) 15 MG tablet Take 1 tablet (15 mg total) by mouth every 4 (four) hours as needed for severe pain. (Patient taking differently: Take 15 mg by mouth every 4 (four) hours as needed for severe pain. 1/2 tab (7.5mg ) q 2-4 hours)  . Multiple Vitamin (MULTIVITAMIN) tablet Take 1 tablet by mouth daily.  . ondansetron (ZOFRAN ODT) 8 MG disintegrating tablet Take 1 tablet (8 mg total) by mouth every 8 (eight) hours as needed for nausea or vomiting.  . polyethylene glycol (MIRALAX / GLYCOLAX) 17 g packet Take 17 g by mouth daily.    No facility-administered encounter medications on file as of 08/26/2019.     PHYSICAL EXAM:   General: NAD, frail appearing, thin Cardiovascular: regular rate and rhythm Pulmonary: clear ant fields Abdomen: soft, nontender, + bowel sounds GU: no suprapubic tenderness Extremities: no edema, no joint deformities Skin: no rashes  Neurological: Weakness but otherwise nonfocal  Julianne Handler, NP

## 2019-08-26 ENCOUNTER — Other Ambulatory Visit: Payer: Self-pay | Admitting: Internal Medicine

## 2019-08-26 ENCOUNTER — Telehealth: Payer: Self-pay | Admitting: *Deleted

## 2019-08-26 ENCOUNTER — Telehealth: Payer: Self-pay | Admitting: Hematology and Oncology

## 2019-08-26 ENCOUNTER — Other Ambulatory Visit: Payer: Self-pay

## 2019-08-26 DIAGNOSIS — Z515 Encounter for palliative care: Secondary | ICD-10-CM

## 2019-08-26 NOTE — Telephone Encounter (Signed)
I can discuss with them again on Thursday at 1115, 45 mins

## 2019-08-26 NOTE — Telephone Encounter (Signed)
Patient's daughter called inquiring about an appointment to meet with Dr. Alvy Bimler again. Patient is feeling very well with the addition of the steroid. She wants to find out about palliative chemo. She is not ready for Hospice at this point and wants to continue treatment

## 2019-08-26 NOTE — Telephone Encounter (Signed)
Scheduled appt per 10/26 sch message - spoke with daughter and she is aware of appt date and time

## 2019-08-29 ENCOUNTER — Inpatient Hospital Stay (HOSPITAL_BASED_OUTPATIENT_CLINIC_OR_DEPARTMENT_OTHER): Payer: Medicare Other | Admitting: Hematology and Oncology

## 2019-08-29 ENCOUNTER — Other Ambulatory Visit: Payer: Self-pay

## 2019-08-29 ENCOUNTER — Encounter: Payer: Self-pay | Admitting: Hematology and Oncology

## 2019-08-29 DIAGNOSIS — R64 Cachexia: Secondary | ICD-10-CM

## 2019-08-29 DIAGNOSIS — C786 Secondary malignant neoplasm of retroperitoneum and peritoneum: Secondary | ICD-10-CM | POA: Diagnosis not present

## 2019-08-29 DIAGNOSIS — G893 Neoplasm related pain (acute) (chronic): Secondary | ICD-10-CM

## 2019-08-29 DIAGNOSIS — C5700 Malignant neoplasm of unspecified fallopian tube: Secondary | ICD-10-CM

## 2019-08-29 DIAGNOSIS — K56600 Partial intestinal obstruction, unspecified as to cause: Secondary | ICD-10-CM | POA: Diagnosis not present

## 2019-08-29 DIAGNOSIS — Z7189 Other specified counseling: Secondary | ICD-10-CM

## 2019-08-29 DIAGNOSIS — K5909 Other constipation: Secondary | ICD-10-CM

## 2019-08-29 MED ORDER — MORPHINE SULFATE 30 MG PO TABS: 30.0000 mg | ORAL_TABLET | ORAL | 0 refills | Status: DC | PRN

## 2019-08-29 MED FILL — MORPHINE SULFATE IR 30 MG T: 30 | 15 days supply | Qty: 90 | Fill #0

## 2019-08-29 NOTE — Assessment & Plan Note (Signed)
We have another goals of care discussion today The patient is at peace with the plan to continue on supportive care management only and not to resume chemotherapy

## 2019-08-29 NOTE — Assessment & Plan Note (Signed)
Her appetite has somewhat improved and her weight is stable Observe for now She will continue low-dose dexamethasone daily

## 2019-08-29 NOTE — Assessment & Plan Note (Signed)
She has severe constipation The importance of aggressive laxative therapy I recommend schedule MiraLAX, prune juice as desired along with Senokot as needed and occasional use of suppository as needed I will continue to assess next week

## 2019-08-29 NOTE — Assessment & Plan Note (Signed)
She is still struggling with optimum pain management After a lot of discussion with her daughter, we are in agreement to continue on MS Contin as prescribed but to increase immediate release morphine to take 30 mg as needed up to 4 times a day for breakthrough pain management We discussed the importance of aggressive laxative therapy

## 2019-08-29 NOTE — Assessment & Plan Note (Signed)
Last week, she has transient improvement of appetite and energy level but then that has almost subsided The patient has changes in mind. Initially, she thought she wants to try palliative chemotherapy again but this last 2 days, she has decided against it She is in agreement to continue on aggressive supportive palliative care

## 2019-08-29 NOTE — Progress Notes (Signed)
[facility] OFFICE PROGRESS NOTE  Patient Care Team: Patient, No Pcp Per as PCP - General ([place]) Ileana Roup, MD as Consulting Physician (General Surgery) Julianne Handler, NP as Nurse Practitioner (Hospice and Palliative Medicine)  ASSESSMENT & PLAN:  Fallopian tube cancer, carcinoma ([place]) Last week, she has transient improvement of appetite and energy level but then that has almost subsided The patient has changes in mind. Initially, she thought she wants to try palliative chemotherapy again but this last 2 days, she has decided against it She is in agreement to continue on aggressive supportive palliative care  Cancer associated pain She is still struggling with optimum pain management After a lot of discussion with her daughter, we are in agreement to continue on MS Contin as prescribed but to increase immediate release morphine to take 30 mg as needed up to 4 times a day for breakthrough pain management We discussed the importance of aggressive laxative therapy  Malignant cachexia ([place]) Her appetite has somewhat improved and her weight is stable Observe for now She will continue low-dose dexamethasone daily  Other constipation She has severe constipation The importance of aggressive laxative therapy I recommend schedule MiraLAX, prune juice as desired along with Senokot as needed and occasional use of suppository as needed I will continue to assess next week  Goals of care, counseling/discussion We have another goals of care discussion today The patient is at peace with the plan to continue on supportive care management only and not to resume chemotherapy   No orders of the defined types were placed in this encounter.   INTERVAL HISTORY: Please see below for problem oriented charting. She returns for further follow-up with her daughter This appointment was made extra because the patient thought she wants to resume chemotherapy last  week She felt really good with the addition of dexamethasone with improved energy level but then that has subsided She continues to have poorly controlled pain in her abdomen She is constipated for almost 3 days Denies nausea Her appetite has somewhat improved  SUMMARY OF ONCOLOGIC HISTORY: Oncology History Overview Note  Neg genetics.  HRD test was done on peritoneal fluid from 07/18/2018: HRD +   Fallopian tube cancer, carcinoma ([place])  06/18/2016 Tumor Marker   Patient's tumor was tested for the following markers: CA-125 Results of the tumor marker test revealed 298.   06/23/2016 PET scan   Diffuse hypermetabolic peritoneal carcinomatosis in the abdomen and pelvis, internal mammary adenopathy in the chest, hypermetabolic right paracardiac/diaphragmatic LN, bilateral pleural effusion and moderate ascites   06/24/2016 Procedure   Therapeutic paracentesis   06/24/2016 Pathology Results   Positive for adenocarcinoma, [place] primary   06/28/2016 Procedure   She underwent CT guided biopsy of omentum   06/28/2016 Pathology Results   High grade serous involving the omentum, positive for p53, PAX8, WT1 and p16.   07/01/2016 Procedure   Findings/impression:   1. Sonographic evaluation of the right internal jugular vein confirms patency. Sonography was required to gain central venous access. An image documenting patency and needle access was saved.   2. Successful placement of right internal jugular vein subcutaneous Mediport as described. Spot film shows the catheter tip at the cavoatrial junction. No pneumothorax. The Mediport aspirates and flushes normally.   07/02/2016 - 08/12/2016 Chemotherapy   The patient had 3 cycles of carboplatin and taxol chemotherapy treatment.     08/29/2016 Imaging   Ct scan of chest, abdomen and pelvis showed marked improvement and resolution  of thoracic adenopathy   09/08/2016 Surgery   She underwent interval debulking surgery with TAH/BSO and  infracolic omentectomy by Dr. Wynelle Cleveland   09/08/2016 Pathology Results   Right tube: scant serous carcinoma. No viable tumor. Left tube and ovary: normal. Omentum: low volume serous carcinoma with few psamomma bodies.   10/07/2016 - 11/18/2016 Chemotherapy   The patient had 3 more cycles of carboplatin and taxol for chemotherapy treatment.     02/07/2017 Imaging   Impression:  1. Colonic diverticulosis without acute diverticulitis. 2. Stable calcification right kidney without hydronephrosis. 3. No acute process or significant interval change has occurred since August 29, 2016.   05/09/2018 Imaging   Impression:  Findings are most suspicious for acute sigmoid colonic diverticulitis with intraloop abscess formation.   05/14/2018 Imaging   Impression: The pelvic fluid collection contiguous with sigmoid colonic diverticulitis changes has not significant change since May 09, 2018. No new fluid collections are seen   06/28/2018 Imaging   IMPRESSION: Changes consistent with diverticulitis with free fluid within the pelvis. No focal contained abscess is seen. A previously noted fluid collection deep within the pelvis has resolved in the interval.   Stable nonobstructing right renal stone.  The remainder of the exam is stable from the prior study.    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 There is omental caking as well as stranding within various areas of the peritoneal fat. There is also wall thickening of the sigmoid colon with adjacent stranding. This finding can also be related to peritoneal carcinomatosis and implants although an inflammatory process of the sigmoid colon is not excluded.   Right nephrolithiasis.  Ileus pattern.   07/16/2018 Imaging   Impression:   Findings of peritoneal carcinomatosis, rapidly  progressive. Not present in July 2019, progressed when compared to September 13.  Multiple bowel loops are distorted but no distention or transition zone to suggest a component of active obstruction.   07/18/2018 Procedure   Findings/impression:  1. Sonographic evaluation of ascites. Sonography was required to gain access to ascites. An image documenting ascites was saved. 2. Successful ultrasound guided paracentesis using a temporary peritoneal drainage catheter inserted in the right upper quadrant as described yielding 600cc of ascites.    07/20/2018 Echocardiogram   General:  The patient was in normal sinus rhythm.  Left Ventricle:  Left ventricular ejection fraction was normal, estimated in the range of 60 to 65%. The left ventricular cavity size appears normal. The left ventricular wall thickness appears normal. The left ventricle is thickened in a fashion  consistent with mild concentric hypertrophy. Doppler tissue velocities and mitral inflow profile are consistent with Stage I diastolic dysfunction. The calculated ejection fraction is 68.1 %.  Right Ventricle:  The right ventricle appears normal in size and function.  Left Atrium:  The left atrium appears normal.  Right Atrium:  The right atrium appears normal.  Aortic Valve:  The structure of the aortic valve is tricuspid. There is evidence of trivial (trace) aortic regurgitation.  Mitral Valve:  There is no evidence of mitral regurgitation. The mitral valve appears normal in structure.  Pulmonic Valve:  There is evidence of trace (trivial) pulmonic regurgitation. The pulmonic valve appears normal in structure.  Tricuspid Valve:  There is evidence of trace (trivial) tricuspid regurgitation. The tricuspid valve appears normal in structure.  Pericardium:  There is no  evidence of pericardial effusion.  Aorta:  The visualized portions of the aorta (ascending aorta, aortic root, and aortic arch) appear normal.  Pulmonic Artery:   Unable to obtain RVSP due to insufficient tricuspid regurgitation jet.  Conclusions: Left ventricular ejection fraction was normal, estimated in the range of 60 to 65%. The left ventricular cavity size appears normal. The left ventricular wall thickness appears normal. The left ventricle is thickened in a fashion consistent with mild concentric hypertrophy. Doppler tissue velocities and mitral inflow profile are consistent with Stage I diastolic dysfunction. The calculated ejection fraction is 68.1%.There is evidence of trivial (trace) aortic regurgitation.There is evidence of trace (trivial) pulmonic regurgitation.There is evidence of trace (trivial) tricuspid regurgitation.There is no evidence of pericardial effusion.Unable to obtain RVSP due to insufficient tricuspid regurgitation jet.   07/23/2018 - 12/25/2018 Chemotherapy   The patient had carboplatin, doxil and Avastin for chemotherapy treatment x 6 cycles   08/01/2018 Imaging   Ct head showed no injuries.   08/01/2018 Procedure   Findings/impression:  1. Sonographic evaluation of ascites. Sonography was required to gain access to ascites. An image documenting ascites was saved. 2. Successful ultrasound guided paracentesis using a temporary peritoneal drainage catheter inserted in the right upper quadrant as described yielding 400cc of ascites.    08/06/2018 Tumor Marker   Patient's tumor was tested for the following markers: CA-125 Results of the tumor marker test revealed 373   08/20/2018 Tumor Marker   Patient's tumor was tested for the following markers: CA-125 Results of the tumor marker test revealed 51.   10/03/2018 Genetic Testing   Patient has genetic testing done for genetics testing using her saliva. 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  Liver and spleen homogeneously enhance. No lesion seen.  The adjacent gallbladder  is surgically absent.  The adrenal glands, kidneys, pancreas are normal.  Pelvic contents are remarkable for an absent uterus.  The mesenteric stranding has nearly completely resolved. Minimal reticular prominence throughout the pelvis.  No residual ascites.  No bowel wall thickening or. No dilated loops or transition zone.  The appendix is not seen. But no inflammatory changes in the right lower quadrant.  Osseous structures are intact.   11/27/2018 Imaging   Ct scan abdomen and pelvis showed colonic diverticulosis and nonspecific fluid levels in the bowel   12/24/2018 Cancer Staging   Staging form: Ovary, Fallopian Tube, and Primary Peritoneal Carcinoma, AJCC 8th Edition - Pathologic: Stage IVB (rpT3a, pN0, pM1b) - Signed by Heath Lark, MD on 12/24/2018   12/31/2018 Tumor Marker   Patient's tumor was tested for the following markers: CA-125 Results of the tumor marker test revealed 14.4   01/01/2019 Imaging   1. LEFT jugular Port-A-Cath catheter tip projects over the mid SVC. 2. No acute cardiopulmonary disease.    01/04/2019 Echocardiogram   1. The left ventricle has normal systolic function with an ejection fraction of 60-65%. The cavity size was normal. Left ventricular diastolic Doppler parameters are consistent with impaired relaxation.  2. The right ventricle has normal systolic function. The cavity was normal. There is no increase in right ventricular wall thickness.  3. The mitral valve is normal in structure.  4. The tricuspid valve is normal in structure.  5. The aortic valve is normal in structure.  6. The pulmonic valve was normal in structure.  7. There is dilatation of the ascending aorta measuring  37 mm.    02/13/2019 -  Chemotherapy   The patient is started on Lynparza due to [place]    04/22/2019 Tumor Marker   Patient's tumor was tested for the following markers: CA-125 Results of the tumor marker test revealed 44.8   04/26/2019 Imaging   1. No evidence of  recurrent or metastatic carcinoma within the abdomen or pelvis. 2. Colonic diverticulosis, without radiographic evidence of diverticulitis or other acute findings.   Aortic Atherosclerosis (ICD10-I70.0).   05/28/2019 Tumor Marker   Patient's tumor was tested for the following markers: CA-125 Results of the tumor marker test revealed 104   07/05/2019 Imaging   1. Imaging findings consistent with recurrent peritoneal carcinomatosis. This is most apparent within the left abdomen involving the transverse colon and descending colon where there is increased soft tissue, loculated fluid and colonic serosal soft tissue thickening. 2. Increase in size of retroperitoneal and pelvic lymph nodes compatible with metastatic adenopathy. 3. No evidence for bowel obstruction.     07/05/2019 Tumor Marker   Patient's tumor was tested for the following markers: CA-125 Results of the tumor marker test revealed 233   07/15/2019 - 07/29/2019 Chemotherapy   The patient had 2 doses of gemzar, DC due to side-effects and progression    08/01/2019 - 08/03/2019 Hospital Admission   She was admitted to the hospital due to fever and pancytopenia She was placed on antibiotics treatment and received transfusion support   08/08/2019 Tumor Marker   Patient's tumor was tested for the following markers: CA-125 Results of the tumor marker test revealed 458     REVIEW OF SYSTEMS:   Constitutional: Denies fevers, chills or abnormal weight loss Eyes: Denies blurriness of vision Ears, nose, mouth, throat, and face: Denies mucositis or sore throat Respiratory: Denies cough, dyspnea or wheezes Cardiovascular: Denies palpitation, chest discomfort or lower extremity swelling Skin: Denies abnormal skin rashes Lymphatics: Denies new lymphadenopathy or easy bruising Neurological:Denies numbness, tingling or new weaknesses Behavioral/Psych: Mood is stable, no new changes  All other systems were reviewed with the patient and are  negative.  I have reviewed the past medical history, past surgical history, social history and family history with the patient and they are unchanged from previous note.  ALLERGIES:  is allergic to latex.  MEDICATIONS:  Current Outpatient Medications  Medication Sig Dispense Refill  . losartan (COZAAR) 100 MG tablet Take 100 mg by mouth daily.    Marland Kitchen acetaminophen (TYLENOL) 500 MG tablet Take 1,000 mg by mouth every 6 (six) hours as needed for moderate pain.    Marland Kitchen amLODipine (NORVASC) 5 MG tablet Take 1 tablet (5 mg total) by mouth daily. (Patient not taking: Reported on 08/24/2019) 30 tablet 11  . atenolol (TENORMIN) 25 MG tablet Take 0.5 tablets (12.5 mg total) by mouth daily. (Patient not taking: Reported on 08/24/2019) 30 tablet 11  . Cholecalciferol (VITAMIN D PO) Take 1 tablet by mouth daily. D3 1000units    . dexamethasone (DECADRON) 2 MG tablet Take 1 tablet (2 mg total) by mouth daily. 30 tablet 0  . levothyroxine (SYNTHROID) 25 MCG tablet Take 1 tablet (25 mcg total) by mouth daily before breakfast. 30 tablet 1  . morphine (MS CONTIN) 30 MG 12 hr tablet Take 1 tablet (30 mg total) by mouth every 8 (eight) hours. 90 tablet 0  . morphine (MSIR) 30 MG tablet Take 1 tablet (30 mg total) by mouth every 4 (four) hours as needed for severe pain. 90 tablet 0  . Multiple  Vitamin (MULTIVITAMIN) tablet Take 1 tablet by mouth daily.    . ondansetron (ZOFRAN ODT) 8 MG disintegrating tablet Take 1 tablet (8 mg total) by mouth every 8 (eight) hours as needed for nausea or vomiting. 30 tablet 3  . polyethylene glycol (MIRALAX / GLYCOLAX) 17 g packet Take 17 g by mouth daily.     Marland Kitchen senna (SENOKOT) 8.6 MG tablet Take 1 tablet by mouth daily. 1-3 tabs qd to bid prn constipation; titrate to one bowel movement qd     No current facility-administered medications for this visit.     PHYSICAL EXAMINATION: ECOG PERFORMANCE STATUS: 2 - Symptomatic, <50% confined to bed  Vitals:   08/29/19 1122  BP: (!)  143/69  Pulse: 70  Resp: 18  Temp: 98.2 F (36.8 C)  SpO2: 94%   There were no vitals filed for this visit.  GENERAL:alert, no distress and comfortable Musculoskeletal:no cyanosis of digits and no clubbing  NEURO: alert & oriented x 3 with fluent speech, no focal motor/sensory deficits  LABORATORY DATA:  I have reviewed the data as listed    Component Value Date/Time   NA 138 08/22/2019 1157   NA 139 06/27/2018 1649   K 4.5 08/22/2019 1157   CL 107 08/22/2019 1157   CO2 22 08/22/2019 1157   GLUCOSE 116 (H) 08/22/2019 1157   BUN 21 08/22/2019 1157   BUN 17 06/27/2018 1649   CREATININE 0.95 08/22/2019 1157   CALCIUM 8.9 08/22/2019 1157   PROT 6.9 08/22/2019 1157   PROT 6.8 05/24/2018 0851   ALBUMIN 2.9 (L) 08/22/2019 1157   ALBUMIN 4.2 05/24/2018 0851   AST 15 08/22/2019 1157   ALT 7 08/22/2019 1157   ALKPHOS 88 08/22/2019 1157   BILITOT 0.2 (L) 08/22/2019 1157   BILITOT 0.2 05/24/2018 0851   GFRNONAA 56 (L) 08/22/2019 1157   GFRAA >60 08/22/2019 1157    No results found for: SPEP, UPEP  Lab Results  Component Value Date   WBC 4.7 08/22/2019   NEUTROABS 2.8 08/22/2019   HGB 9.8 (L) 08/22/2019   HCT 30.2 (L) 08/22/2019   MCV 109.4 (H) 08/22/2019   PLT 286 08/22/2019      Chemistry      Component Value Date/Time   NA 138 08/22/2019 1157   NA 139 06/27/2018 1649   K 4.5 08/22/2019 1157   CL 107 08/22/2019 1157   CO2 22 08/22/2019 1157   BUN 21 08/22/2019 1157   BUN 17 06/27/2018 1649   CREATININE 0.95 08/22/2019 1157      Component Value Date/Time   CALCIUM 8.9 08/22/2019 1157   ALKPHOS 88 08/22/2019 1157   AST 15 08/22/2019 1157   ALT 7 08/22/2019 1157   BILITOT 0.2 (L) 08/22/2019 1157   BILITOT 0.2 05/24/2018 0851       RADIOGRAPHIC STUDIES: I have personally reviewed the radiological images as listed and agreed with the findings in the report. Dg Chest Port 1 View  Result Date: 08/01/2019 CLINICAL DATA:  Daughter states that patient had  fever and confusion since yesterday. Had cancer treatment on Monday. PT HX: non smoker EXAM: PORTABLE CHEST 1 VIEW COMPARISON:  07/17/2019 FINDINGS: Cardiac silhouette normal in size and configuration. No mediastinal or hilar masses or convincing adenopathy. Clear lungs.  No pleural effusion or pneumothorax. Stable right anterior chest wall Port-A-Cath. Skeletal structures are demineralized but grossly intact. IMPRESSION: No acute cardiopulmonary disease. Electronically Signed   By: Lajean Manes M.D.   On: 08/01/2019 19:23  Vas [place] Lower Extremity Venous (dvt)  Result Date: 08/02/2019  Lower Venous Study Indications: Cellulitis.  Risk Factors: None identified. Comparison Study: No prior studies. Performing Technologist: Oliver Hum RVT  Examination Guidelines: A complete evaluation includes B-mode imaging, spectral Doppler, color Doppler, and power Doppler as needed of all accessible portions of each vessel. Bilateral testing is considered an integral part of a complete examination. Limited examinations for reoccurring indications may be performed as noted.  +-----+---------------+---------+-----------+----------+--------------+ RIGHTCompressibilityPhasicitySpontaneityPropertiesThrombus Aging +-----+---------------+---------+-----------+----------+--------------+ CFV  Full           Yes      Yes                                 +-----+---------------+---------+-----------+----------+--------------+   +---------+---------------+---------+-----------+----------+--------------+ LEFT     CompressibilityPhasicitySpontaneityPropertiesThrombus Aging +---------+---------------+---------+-----------+----------+--------------+ CFV      Full           Yes      Yes                                 +---------+---------------+---------+-----------+----------+--------------+ SFJ      Full                                                         +---------+---------------+---------+-----------+----------+--------------+ FV Prox  Full                                                        +---------+---------------+---------+-----------+----------+--------------+ FV Mid   Full                                                        +---------+---------------+---------+-----------+----------+--------------+ FV DistalFull                                                        +---------+---------------+---------+-----------+----------+--------------+ PFV      Full                                                        +---------+---------------+---------+-----------+----------+--------------+ POP      Full           Yes      Yes                                 +---------+---------------+---------+-----------+----------+--------------+ PTV      Full                                                        +---------+---------------+---------+-----------+----------+--------------+  PERO     Full                                                        +---------+---------------+---------+-----------+----------+--------------+     Summary: Right: No evidence of common femoral vein obstruction. Left: There is no evidence of deep vein thrombosis in the lower extremity. No cystic structure found in the popliteal fossa.  *See table(s) above for measurements and observations. Electronically signed by Monica Martinez MD on 08/02/2019 at 4:36:22 PM.    Final     All questions were answered. The patient knows to call the clinic with any problems, questions or concerns. No barriers to learning was detected.  I spent 25 minutes counseling the patient face to face. The total time spent in the appointment was 30 minutes and more than 50% was on counseling and review of test results  Heath Lark, MD 08/29/2019 12:26 PM

## 2019-08-31 ENCOUNTER — Emergency Department (HOSPITAL_COMMUNITY): Payer: Medicare Other

## 2019-08-31 ENCOUNTER — Other Ambulatory Visit: Payer: Self-pay

## 2019-08-31 ENCOUNTER — Encounter (HOSPITAL_COMMUNITY): Payer: Self-pay | Admitting: Emergency Medicine

## 2019-08-31 ENCOUNTER — Inpatient Hospital Stay (HOSPITAL_COMMUNITY)
Admission: EM | Admit: 2019-08-31 | Discharge: 2019-09-04 | DRG: 375 | Disposition: A | Discharge status: Hospice | Payer: Medicare Other | Attending: Internal Medicine | Admitting: Internal Medicine

## 2019-08-31 ENCOUNTER — Telehealth: Payer: Self-pay | Admitting: Primary Care

## 2019-08-31 DIAGNOSIS — Z66 Do not resuscitate: Secondary | ICD-10-CM | POA: Diagnosis present

## 2019-08-31 DIAGNOSIS — C5700 Malignant neoplasm of unspecified fallopian tube: Secondary | ICD-10-CM | POA: Diagnosis present

## 2019-08-31 DIAGNOSIS — K567 Ileus, unspecified: Secondary | ICD-10-CM | POA: Diagnosis present

## 2019-08-31 DIAGNOSIS — I129 Hypertensive chronic kidney disease with stage 1 through stage 4 chronic kidney disease, or unspecified chronic kidney disease: Secondary | ICD-10-CM | POA: Diagnosis present

## 2019-08-31 DIAGNOSIS — K56600 Partial intestinal obstruction, unspecified as to cause: Secondary | ICD-10-CM | POA: Diagnosis present

## 2019-08-31 DIAGNOSIS — N183 Chronic kidney disease, stage 3 unspecified: Secondary | ICD-10-CM | POA: Diagnosis present

## 2019-08-31 DIAGNOSIS — R188 Other ascites: Secondary | ICD-10-CM | POA: Diagnosis present

## 2019-08-31 DIAGNOSIS — I10 Essential (primary) hypertension: Secondary | ICD-10-CM | POA: Diagnosis not present

## 2019-08-31 DIAGNOSIS — Z20828 Contact with and (suspected) exposure to other viral communicable diseases: Secondary | ICD-10-CM | POA: Diagnosis present

## 2019-08-31 DIAGNOSIS — Z90710 Acquired absence of both cervix and uterus: Secondary | ICD-10-CM | POA: Diagnosis not present

## 2019-08-31 DIAGNOSIS — Z79890 Hormone replacement therapy (postmenopausal): Secondary | ICD-10-CM | POA: Diagnosis not present

## 2019-08-31 DIAGNOSIS — G893 Neoplasm related pain (acute) (chronic): Secondary | ICD-10-CM | POA: Diagnosis present

## 2019-08-31 DIAGNOSIS — Z515 Encounter for palliative care: Secondary | ICD-10-CM | POA: Diagnosis present

## 2019-08-31 DIAGNOSIS — K56609 Unspecified intestinal obstruction, unspecified as to partial versus complete obstruction: Secondary | ICD-10-CM | POA: Diagnosis not present

## 2019-08-31 DIAGNOSIS — Z79891 Long term (current) use of opiate analgesic: Secondary | ICD-10-CM | POA: Diagnosis not present

## 2019-08-31 DIAGNOSIS — Z800 Family history of malignant neoplasm of digestive organs: Secondary | ICD-10-CM

## 2019-08-31 DIAGNOSIS — Z79899 Other long term (current) drug therapy: Secondary | ICD-10-CM

## 2019-08-31 DIAGNOSIS — Z8544 Personal history of malignant neoplasm of other female genital organs: Secondary | ICD-10-CM | POA: Diagnosis not present

## 2019-08-31 DIAGNOSIS — C786 Secondary malignant neoplasm of retroperitoneum and peritoneum: Principal | ICD-10-CM | POA: Diagnosis present

## 2019-08-31 DIAGNOSIS — N1831 Chronic kidney disease, stage 3a: Secondary | ICD-10-CM | POA: Diagnosis not present

## 2019-08-31 DIAGNOSIS — Z8249 Family history of ischemic heart disease and other diseases of the circulatory system: Secondary | ICD-10-CM

## 2019-08-31 DIAGNOSIS — E785 Hyperlipidemia, unspecified: Secondary | ICD-10-CM | POA: Diagnosis present

## 2019-08-31 DIAGNOSIS — Z7952 Long term (current) use of systemic steroids: Secondary | ICD-10-CM

## 2019-08-31 LAB — COMPREHENSIVE METABOLIC PANEL
ALT: 14 U/L (ref 0–44)
AST: 20 U/L (ref 15–41)
Albumin: 3.1 g/dL — ABNORMAL LOW (ref 3.5–5.0)
Alkaline Phosphatase: 74 U/L (ref 38–126)
Anion gap: 12 (ref 5–15)
BUN: 47 mg/dL — ABNORMAL HIGH (ref 8–23)
CO2: 23 mmol/L (ref 22–32)
Calcium: 9.2 mg/dL (ref 8.9–10.3)
Chloride: 103 mmol/L (ref 98–111)
Creatinine, Ser: 1.38 mg/dL — ABNORMAL HIGH (ref 0.44–1.00)
GFR calc Af Amer: 41 mL/min — ABNORMAL LOW (ref >60)
GFR calc non Af Amer: 36 mL/min — ABNORMAL LOW (ref >60)
Glucose, Bld: 162 mg/dL — ABNORMAL HIGH (ref 70–99)
Potassium: 4.5 mmol/L (ref 3.5–5.1)
Sodium: 138 mmol/L (ref 135–145)
Total Bilirubin: 0.6 mg/dL (ref 0.3–1.2)
Total Protein: 6.8 g/dL (ref 6.5–8.1)

## 2019-08-31 LAB — URINALYSIS, ROUTINE W REFLEX MICROSCOPIC
Bilirubin Urine: NEGATIVE
Glucose, UA: NEGATIVE mg/dL
Hgb urine dipstick: NEGATIVE
Ketones, ur: NEGATIVE mg/dL
Leukocytes,Ua: NEGATIVE
Nitrite: NEGATIVE
Protein, ur: NEGATIVE mg/dL
Specific Gravity, Urine: 1.02 (ref 1.005–1.030)
pH: 5 (ref 5.0–8.0)

## 2019-08-31 LAB — CBC
HCT: 35.7 % — ABNORMAL LOW (ref 36.0–46.0)
Hemoglobin: 11.5 g/dL — ABNORMAL LOW (ref 12.0–15.0)
MCH: 35.1 pg — ABNORMAL HIGH (ref 26.0–34.0)
MCHC: 32.2 g/dL (ref 30.0–36.0)
MCV: 108.8 fL — ABNORMAL HIGH (ref 80.0–100.0)
Platelets: 322 10*3/uL (ref 150–400)
RBC: 3.28 MIL/uL — ABNORMAL LOW (ref 3.87–5.11)
RDW: 16 % — ABNORMAL HIGH (ref 11.5–15.5)
WBC: 17.4 10*3/uL — ABNORMAL HIGH (ref 4.0–10.5)
nRBC: 0 % (ref 0.0–0.2)

## 2019-08-31 LAB — LIPASE, BLOOD: Lipase: 16 U/L (ref 11–51)

## 2019-08-31 MED ORDER — ONDANSETRON HCL 4 MG/2ML IJ SOLN
4.0000 mg | Freq: Once | INTRAMUSCULAR | Status: AC
  Administered 2019-08-31: 16:00:00 4 mg via INTRAVENOUS
  Filled 2019-08-31: qty 2

## 2019-08-31 MED ORDER — HYDROMORPHONE HCL 1 MG/ML IJ SOLN
1.0000 mg | Freq: Once | INTRAMUSCULAR | Status: AC
  Administered 2019-08-31: 16:00:00 1 mg via INTRAVENOUS
  Filled 2019-08-31: qty 1

## 2019-08-31 MED ORDER — LACTATED RINGERS IV BOLUS
1000.0000 mL | Freq: Once | INTRAVENOUS | Status: AC
  Administered 2019-08-31: 16:00:00 1000 mL via INTRAVENOUS

## 2019-08-31 MED ORDER — LIDOCAINE VISCOUS HCL 2 % MT SOLN
15.0000 mL | Freq: Once | OROMUCOSAL | Status: AC
  Administered 2019-08-31: 15 mL via OROMUCOSAL
  Filled 2019-08-31: qty 15

## 2019-08-31 MED ORDER — SODIUM CHLORIDE 0.9% FLUSH
3.0000 mL | Freq: Once | INTRAVENOUS | Status: AC
  Administered 2019-08-31: 3 mL via INTRAVENOUS

## 2019-08-31 MED ORDER — LORAZEPAM 2 MG/ML IJ SOLN
1.0000 mg | Freq: Once | INTRAMUSCULAR | Status: AC
  Administered 2019-08-31: 1 mg via INTRAVENOUS
  Filled 2019-08-31: qty 1

## 2019-08-31 MED ORDER — IOHEXOL 300 MG/ML  SOLN
75.0000 mL | Freq: Once | INTRAMUSCULAR | Status: AC | PRN
  Administered 2019-08-31: 17:00:00 75 mL via INTRAVENOUS

## 2019-08-31 NOTE — ED Provider Notes (Signed)
[facility] EMERGENCY DEPARTMENT Provider Note   CSN: [id number] Arrival date & time: 08/31/19  1524     History   Chief Complaint Chief Complaint  Patient presents with   Emesis    HPI Lisa Moyer is a 84 y.o. female.     HPI  84 year old with history of diverticulitis, fallopian tube cancer, hypertension, hyperlipidemia comes in a chief complaint of vomiting.  Patient is under palliative care service and had her initial assessment by hospice today.  Her last chemotherapy was 3 weeks ago and she wants to focus more on comfort care moving forward.  According to the family patient was doing well yesterday but started developing nausea and vomiting overnight.  She has had about 5-10 episodes of emesis, dark brown/coffee-ground in color.  She also has had some worsening of abdominal pain.  Patient does not think she is passing flatus and her last BM was Thursday.  There is no history of small bowel obstruction.  Patient states that yesterday she did not have significant appetite but did eat small meals.  Past Medical History:  Diagnosis Date   Arthritis    mild   Blood transfusion without reported diagnosis    DDD (degenerative disc disease), lumbar 05/09/2018   CT shows multilevel degenerative hypertrophic changes worst at L2-3   Diverticulitis    Fallopian tube cancer, carcinoma ([place])    Fatty liver 06/28/2018   seen on CT   Hyperlipemia    Hypertension    Kidney stone 05/2018   Right 42mm nonobstructing renal stone seen on CT - pt asymptomatic   Pneumonia    Sliding hiatal hernia 06/28/2018   seen on CT    Patient Active Problem List   Diagnosis Date Noted   SBO (small bowel obstruction) ([place]) 08/31/2019   Other constipation 08/29/2019   Malignant cachexia ([place]) 08/22/2019   Cellulitis of left leg 08/01/2019   Sepsis ([place]) 123XX123   Acute metabolic encephalopathy 123XX123   Hyponatremia 08/01/2019   Recurrent  fever 07/22/2019   Cancer associated pain 07/05/2019   Ovarian cancer ([place]) 04/23/2019   Insomnia disorder 04/04/2019   Acquired hypothyroidism 03/15/2019   Abdominal pain 02/22/2019   Other proteinuria 02/22/2019   CKD (chronic kidney disease), stage III 02/22/2019   Nausea & vomiting 02/22/2019   Port-A-Cath in place 02/07/2019   Physical debility 01/11/2019   Goals of care, counseling/discussion 01/11/2019   Dyspnea on exertion 01/01/2019   Pancytopenia, acquired ([place]) 01/01/2019   Essential hypertension 01/01/2019   Anemia due to antineoplastic chemotherapy 12/31/2018   Deficiency anemia 12/31/2018   Diarrhea 12/31/2018   Other fatigue 12/31/2018   Fallopian tube cancer, carcinoma ([place]) 12/24/2018   Diverticulitis of colon 06/27/2018    Past Surgical History:  Procedure Laterality Date   ABDOMINAL HYSTERECTOMY  08/2016   total with BSO due to fallopian tube carcinoma, and pre-adjuvent and post-operative chemo as well each x 3 mos   CHOLECYSTECTOMY  2001   OTHER SURGICAL HISTORY  2017-2018   Chemotherapy      OB History   No obstetric history on file.      Home Medications    Prior to Admission medications   Medication Sig Start Date End Date Taking? Authorizing Provider  acetaminophen (TYLENOL) 500 MG tablet Take 1,000 mg by mouth every 6 (six) hours as needed for moderate pain.   Yes [provider]  dexamethasone (DECADRON) 2 MG tablet Take 1 tablet (2 mg total) by mouth daily. 08/22/19  Yes Heath Lark, MD  levothyroxine (SYNTHROID) 25 MCG tablet Take 1 tablet (25 mcg total) by mouth daily before breakfast. 08/22/19  Yes Gorsuch, Ni, MD  losartan (COZAAR) 100 MG tablet Take 100 mg by mouth daily.   Yes [provider]  morphine (MS CONTIN) 30 MG 12 hr tablet Take 1 tablet (30 mg total) by mouth every 8 (eight) hours. 08/22/19  Yes Gorsuch, Ni, MD  morphine (MSIR) 30 MG tablet Take 1 tablet (30 mg total) by mouth every 4  (four) hours as needed for severe pain. 08/29/19  Yes Gorsuch, Ni, MD  ondansetron (ZOFRAN ODT) 8 MG disintegrating tablet Take 1 tablet (8 mg total) by mouth every 8 (eight) hours as needed for nausea or vomiting. 07/18/19  Yes Gorsuch, Ni, MD  polyethylene glycol (MIRALAX / GLYCOLAX) 17 g packet Take 17 g by mouth daily.    Yes [provider]  senna (SENOKOT) 8.6 MG tablet Take 1 tablet by mouth daily.    Yes Serpe, Aletha Halim, NP  amLODipine (NORVASC) 5 MG tablet Take 1 tablet (5 mg total) by mouth daily. Patient not taking: Reported on 08/24/2019 08/08/19   Heath Lark, MD  atenolol (TENORMIN) 25 MG tablet Take 0.5 tablets (12.5 mg total) by mouth daily. Patient not taking: Reported on 08/24/2019 02/21/19   Heath Lark, MD    Family History Family History  Problem Relation Age of Onset   Heart disease Mother        MI cause of death   Colon cancer Mother        colon   Pancreatic cancer Father        pancreatic    Social History Social History   Tobacco Use   Smoking status: Never Smoker   Smokeless tobacco: Never Used  Substance Use Topics   Alcohol use: Never    Frequency: Never   Drug use: Never     Allergies   Latex   Review of Systems Review of Systems  Constitutional: Positive for activity change.  Gastrointestinal: Positive for abdominal pain, constipation, nausea and vomiting.  Allergic/Immunologic: Negative for immunocompromised state.  Neurological: Positive for dizziness and weakness.  All other systems reviewed and are negative.    Physical Exam Updated Vital Signs BP (!) 147/90    Pulse 83    Temp 98.2 F (36.8 C) (Oral)    Resp (!) 25    SpO2 97%   Physical Exam Vitals signs and nursing note reviewed.  Constitutional:      Appearance: She is well-developed.  HENT:     Head: Normocephalic and atraumatic.     Mouth/Throat:     Mouth: Mucous membranes are dry.  Eyes:     Pupils: Pupils are equal, round, and reactive to light.    Neck:     Musculoskeletal: Neck supple.  Cardiovascular:     Rate and Rhythm: Normal rate and regular rhythm.     Heart sounds: Normal heart sounds. No murmur.  Pulmonary:     Effort: Pulmonary effort is normal. No respiratory distress.  Abdominal:     General: There is distension.     Palpations: Abdomen is soft.     Tenderness: There is abdominal tenderness. There is no guarding or rebound.     Comments: Diffuse lower quadrant tenderness, worse on the right side  Skin:    General: Skin is warm and dry.  Neurological:     Mental Status: She is alert and oriented to person, place,  and time.      ED Treatments / Results  Labs (all labs ordered are listed, but only abnormal results are displayed) Labs Reviewed  COMPREHENSIVE METABOLIC PANEL - Abnormal; Notable for the following components:      Result Value   Glucose, Bld 162 (*)    BUN 47 (*)    Creatinine, Ser 1.38 (*)    Albumin 3.1 (*)    GFR calc non Af Amer 36 (*)    GFR calc Af Amer 41 (*)    All other components within normal limits  CBC - Abnormal; Notable for the following components:   WBC 17.4 (*)    RBC 3.28 (*)    Hemoglobin 11.5 (*)    HCT 35.7 (*)    MCV 108.8 (*)    MCH 35.1 (*)    RDW 16.0 (*)    All other components within normal limits  SARS CORONAVIRUS 2 (TAT 6-24 HRS)  LIPASE, BLOOD  URINALYSIS, ROUTINE W REFLEX MICROSCOPIC    EKG EKG Interpretation  Date/Time:  Saturday August 31 2019 15:59:27 EDT Ventricular Rate:  82 PR Interval:    QRS Duration: 96 QT Interval:  367 QTC Calculation: 429 R Axis:   -57 Text Interpretation: Sinus rhythm Left anterior fascicular block Consider anterior infarct Minimal ST elevation, lateral leads No acute changes No significant change since last tracing Reconfirmed by Varney Biles [phone]) on 08/31/2019 8:56:14 PM   Radiology Ct Abdomen Pelvis W Contrast  Result Date: 08/31/2019 CLINICAL DATA:  Right-sided abdominal pain, abdominal distention,  and nausea and vomiting for 2 days. Metastatic fallopian tube carcinoma. EXAM: CT ABDOMEN AND PELVIS WITH CONTRAST TECHNIQUE: Multidetector CT imaging of the abdomen and pelvis was performed using the standard protocol following bolus administration of intravenous contrast. CONTRAST:  57mL OMNIPAQUE IOHEXOL 300 MG/ML  SOLN COMPARISON:  07/05/2019 FINDINGS: Lower Chest: New small bilateral pleural effusions. Hepatobiliary: No hepatic masses identified. Prior cholecystectomy. No evidence of biliary obstruction. Pancreas:  No mass or inflammatory changes. Spleen: Within normal limits in size and appearance. Adrenals/Urinary Tract: No masses identified. No evidence of hydronephrosis. Stomach/Bowel: Mild dilatation of proximal small bowel is seen within the left upper and lower quadrants, with transition point in the left lower quadrant, consistent with partial small bowel obstruction. Diverticulosis is seen mainly involving the descending and sigmoid colon, however there is no evidence of diverticulitis. Vascular/Lymphatic: Stable mildly enlarged left paraaortic and left external iliac lymph nodes, largest measuring 11 mm on image 65/3. No new or increased lymphadenopathy identified. Aortic atherosclerosis. No abdominal aortic aneurysm. Reproductive: Prior hysterectomy noted. Adnexal regions are unremarkable in appearance. Increased diffuse mesenteric soft tissue stranding, and peritoneal contrast enhancement is seen as well as new mild ascites. This is consistent with increased peritoneal carcinomatosis. Other:  None. Musculoskeletal:  No suspicious bone lesions identified. IMPRESSION: Increased peritoneal carcinomatosis and mild ascites since prior exam. Partial small bowel obstruction, with transition point in left lower quadrant, likely due to peritoneal carcinomatosis. Stable mildly enlarged left paraaortic and external iliac lymph nodes. Colonic diverticulosis, without radiographic evidence of diverticulitis. New  small bilateral pleural effusions. Electronically Signed   By: Marlaine Hind M.D.   On: 08/31/2019 17:05   Dg Abd Portable 1 View  Result Date: 08/31/2019 CLINICAL DATA:  Nasogastric tube placement EXAM: PORTABLE ABDOMEN - 1 VIEW COMPARISON:  Portable exam 2326 hours compared to CT abdomen and pelvis 08/31/2019 FINDINGS: Nasogastric tube tip projects over gastric antrum. Atelectasis versus consolidation LEFT lower lobe. Paucity of bowel  gas. Bones demineralized. IMPRESSION: Tip of nasogastric tube projects over gastric antrum. Electronically Signed   By: Lavonia Dana M.D.   On: 08/31/2019 23:49    Procedures .Critical Care Performed by: Varney Biles, MD Authorized by: Varney Biles, MD   Critical care provider statement:    Critical care time (minutes):  102   Critical care was necessary to treat or prevent imminent or life-threatening deterioration of the following conditions:  Dehydration   Critical care was time spent personally by me on the following activities:  Discussions with consultants, evaluation of patient's response to treatment, examination of patient, ordering and performing treatments and interventions, ordering and review of laboratory studies, ordering and review of radiographic studies, pulse oximetry, re-evaluation of patient's condition, obtaining history from patient or surrogate and review of old charts   (including critical care time)  Medications Ordered in ED Medications  sodium chloride flush (NS) 0.9 % injection 3 mL (3 mLs Intravenous Given 08/31/19 1624)  ondansetron (ZOFRAN) injection 4 mg (4 mg Intravenous Given 08/31/19 1616)  HYDROmorphone (DILAUDID) injection 1 mg (1 mg Intravenous Given 08/31/19 1616)  lactated ringers bolus 1,000 mL (0 mLs Intravenous Stopped 08/31/19 1813)  iohexol (OMNIPAQUE) 300 MG/ML solution 75 mL (75 mLs Intravenous Contrast Given 08/31/19 1646)  lidocaine (XYLOCAINE) 2 % viscous mouth solution 15 mL (15 mLs Mouth/Throat Given  08/31/19 2212)  LORazepam (ATIVAN) injection 1 mg (1 mg Intravenous Given 08/31/19 2053)     Initial Impression / Assessment and Plan / ED Course  I have reviewed the triage vital signs and the nursing notes.  Pertinent labs & imaging results that were available during my care of the patient were reviewed by me and considered in my medical decision making (see chart for details).  Clinical Course as of Sep 01 7  Sat Aug 31, 2019  1810 BUN/creatinine ratio reveals some dehydration.  Patient has white count of 17.4, however concerns for infection is lower.  BUN(!): 47 [AN]  1810 CT scan has been independently reviewed.  There is evidence of partial small bowel obstruction.  We will consult oncology first to figure out the next steps.  CT ABDOMEN PELVIS W CONTRAST [AN]  Sun Sep 01, 2019  0005 I spoke with [facility] hospice nursing staff.  Their hospital liaison will follow the patient if they are admitted.  They informed me that there is no bed available at [facility] until Monday.  Patient's family and I had long discussion.  We have ultimately agreed to place an NG tube and hope for improvement, even if it is transient so that patient can have better quality of life.  She has reported that if she is not going to get better than she rather just be kept comfortable even if it means today she dies.  She does not want any invasive therapy.  I had spoken with Dr. Payton Mccallum, oncology, their service will see the patient tomorrow.  He thinks that patient is not a good candidate for palliative radiation.  Ativan ultimately help the patient with her symptoms.  She is stable for admission.   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  She has not had any BM since the last 2 days and is not passing flatus  therefore we are concerned that  the tumor might have expanded and is causing small obstruction.  She just had her intake visit by hospice team today, however her symptoms will need further evaluation as there is clear palliative/comfort component to her symptoms and the diagnosis can change the management.  CT scan ordered.  Pain and nausea controlled for now.  Final Clinical Impressions(s) / ED Diagnoses   Final diagnoses:  Partial small bowel obstruction [facility])    ED Discharge Orders    None       Varney Biles, MD 09/01/19 0008

## 2019-08-31 NOTE — ED Notes (Signed)
Pt ambulated to bathroom x1 assist.  

## 2019-08-31 NOTE — H&P (Signed)
History and Physical   Lisa Moyer [id number] DOB: 13-Jun-1935 DOA: 08/31/2019  Referring MD/NP/PA: Dr. Kathrynn Humble  PCP: Shawnee Knapp, MD   Outpatient Specialists: Heath Lark, oncology  Patient coming from: Home  Chief Complaint: Nausea with vomiting  HPI: Lisa Moyer is a 84 y.o. 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  Patient has had multiple episodes of coffee brown emesis up to 10 times today.  The abdominal pain has worsened despite being on narcotics.  No prior to intestinal or other GI's history.  Patient was seen in the ER with a evaluation showing partial small bowel obstruction.  She is being admitted therefore for management of small bowel obstruction.  Patient is under palliative care with no ongoing treatment for her cancer.  ED Course: Temperature 99.5 blood pressure 170/142 pulse 95 respirate 28 oxygen sat 90% room air.  White count 17.4 hemoglobin 11.5 and platelets 322.  Glucose 162, BUN 47 creatinine 1.38.  CT abdomen and pelvis showed increased peritoneal carcinomatosis and mild ascites.  Partial small bowel obstruction with transition point in left lower quadrant probably due to peritoneal carcinomatosis.  NG tube inserted and patient is being admitted for treatment  Review of Systems: As per HPI otherwise 10 point review of systems negative.    Past Medical History:  Diagnosis Date   Arthritis    mild   Blood transfusion without reported diagnosis    DDD (degenerative disc disease), lumbar 05/09/2018   CT shows multilevel degenerative hypertrophic changes worst at L2-3   Diverticulitis    Fallopian tube cancer, carcinoma ([place])    Fatty liver 06/28/2018   seen on CT   Hyperlipemia    Hypertension    Kidney stone 05/2018   Right 68mm nonobstructing renal stone seen on CT - pt asymptomatic   Pneumonia    Sliding hiatal hernia 06/28/2018   seen on CT    Past Surgical History:  Procedure Laterality Date   ABDOMINAL HYSTERECTOMY  08/2016   total with BSO due to fallopian tube carcinoma, and pre-adjuvent and post-operative chemo as well each x 3 mos   CHOLECYSTECTOMY  2001   OTHER SURGICAL HISTORY  2017-2018   Chemotherapy      reports that she has never smoked. She has never used smokeless tobacco. She reports that she does not drink alcohol or use drugs.  Allergies  Allergen Reactions   Latex Other (See Comments)    She has a sensitivity to [facility] so prior drs told her to avoid latex and listed it as an allergy    Family History  Problem Relation Age of Onset   Heart disease Mother        MI cause of death   Colon cancer Mother        colon   Pancreatic cancer Father        pancreatic     Prior to Admission medications   Medication Sig Start Date End Date Taking? Authorizing Provider  acetaminophen (TYLENOL) 500 MG tablet Take 1,000 mg by mouth every 6 (six) hours as needed for moderate pain.   Yes [provider]  dexamethasone (DECADRON) 2 MG tablet Take 1 tablet (2 mg total) by mouth daily. 08/22/19  Yes Gorsuch, Ernst Spell, MD  levothyroxine (SYNTHROID) 25 MCG tablet Take 1 tablet (25 mcg total) by mouth daily before  breakfast. 08/22/19  Yes Gorsuch, Ni, MD  losartan (COZAAR) 100 MG tablet Take 100 mg by mouth daily.   Yes [provider]  morphine (MS CONTIN) 30 MG 12 hr tablet Take 1 tablet (30 mg total) by mouth every 8 (eight) hours. 08/22/19  Yes Gorsuch, Ni, MD  morphine (MSIR) 30 MG tablet Take 1 tablet (30 mg total) by mouth every 4 (four) hours as needed for severe pain. 08/29/19  Yes Gorsuch, Ni, MD  ondansetron (ZOFRAN ODT) 8 MG disintegrating tablet Take 1 tablet (8 mg total) by mouth every 8 (eight) hours as needed for nausea or vomiting. 07/18/19  Yes Gorsuch, Ni,  MD  polyethylene glycol (MIRALAX / GLYCOLAX) 17 g packet Take 17 g by mouth daily.    Yes [provider]  senna (SENOKOT) 8.6 MG tablet Take 1 tablet by mouth daily.    Yes Serpe, Aletha Halim, NP  amLODipine (NORVASC) 5 MG tablet Take 1 tablet (5 mg total) by mouth daily. Patient not taking: Reported on 08/24/2019 08/08/19   Heath Lark, MD  atenolol (TENORMIN) 25 MG tablet Take 0.5 tablets (12.5 mg total) by mouth daily. Patient not taking: Reported on 08/24/2019 02/21/19   Heath Lark, MD    Physical Exam: Vitals:   08/31/19 2015 08/31/19 2115 08/31/19 2200 08/31/19 2215  BP: (!) 144/90 125/81 124/73 125/67  Pulse: 85 91 83 85  Resp: 14 20 (!) 28 (!) 21  Temp:      TempSrc:      SpO2: 97% 96% 97% 97%      Constitutional: Chronically ill looking, mildly cachectic Vitals:   08/31/19 2015 08/31/19 2115 08/31/19 2200 08/31/19 2215  BP: (!) 144/90 125/81 124/73 125/67  Pulse: 85 91 83 85  Resp: 14 20 (!) 28 (!) 21  Temp:      TempSrc:      SpO2: 97% 96% 97% 97%   Eyes: PERRL, lids and conjunctivae normal ENMT: Mucous membranes are moist. Posterior pharynx clear of any exudate or lesions.Normal dentition.  Neck: normal, supple, no masses, no thyromegaly Respiratory: clear to auscultation bilaterally, no wheezing, no crackles. Normal respiratory effort. No accessory muscle use.  Cardiovascular: Regular rate and rhythm, no murmurs / rubs / gallops. No extremity edema. 2+ pedal pulses. No carotid bruits.  Abdomen: Distended abdomen, tympanic, diffusely tender positive ascites.  Bowel sounds positive.  Musculoskeletal: no clubbing / cyanosis. No joint deformity upper and lower extremities. Good ROM, no contractures. Normal muscle tone.  Skin: no rashes, lesions, ulcers. No induration Neurologic: CN 2-12 grossly intact. Sensation intact, DTR normal. Strength 5/5 in all 4.  Psychiatric: Normal judgment and insight. Alert and oriented x 3. Normal mood.     Labs on Admission: I  have personally reviewed following labs and imaging studies  CBC: Recent Labs  Lab 08/31/19 1538  WBC 17.4*  HGB 11.5*  HCT 35.7*  MCV 108.8*  PLT [id number]   Basic Metabolic Panel: Recent Labs  Lab 08/31/19 1538  NA 138  K 4.5  CL 103  CO2 23  GLUCOSE 162*  BUN 47*  CREATININE 1.38*  CALCIUM 9.2   GFR: Estimated Creatinine Clearance: 30.4 mL/min (A) (by C-G formula based on SCr of 1.38 mg/dL (H)). Liver Function Tests: Recent Labs  Lab 08/31/19 1538  AST 20  ALT 14  ALKPHOS 74  BILITOT 0.6  PROT 6.8  ALBUMIN 3.1*   Recent Labs  Lab 08/31/19 1538  LIPASE 16   No results for input(s): AMMONIA  in the last 168 hours. Coagulation Profile: No results for input(s): INR, PROTIME in the last 168 hours. Cardiac Enzymes: No results for input(s): CKTOTAL, CKMB, CKMBINDEX, TROPONINI in the last 168 hours. BNP (last 3 results) No results for input(s): PROBNP in the last 8760 hours. HbA1C: No results for input(s): HGBA1C in the last 72 hours. CBG: No results for input(s): GLUCAP in the last 168 hours. Lipid Profile: No results for input(s): CHOL, HDL, LDLCALC, TRIG, CHOLHDL, LDLDIRECT in the last 72 hours. Thyroid Function Tests: No results for input(s): TSH, T4TOTAL, FREET4, T3FREE, THYROIDAB in the last 72 hours. Anemia Panel: No results for input(s): VITAMINB12, FOLATE, FERRITIN, TIBC, IRON, RETICCTPCT in the last 72 hours. Urine analysis:    Component Value Date/Time   COLORURINE YELLOW 08/31/2019 1636   APPEARANCEUR CLEAR 08/31/2019 1636   LABSPEC 1.020 08/31/2019 1636   PHURINE 5.0 08/31/2019 1636   GLUCOSEU NEGATIVE 08/31/2019 1636   HGBUR NEGATIVE 08/31/2019 1636   BILIRUBINUR NEGATIVE 08/31/2019 1636   BILIRUBINUR negative 07/13/2018 1210   KETONESUR NEGATIVE 08/31/2019 1636   PROTEINUR NEGATIVE 08/31/2019 1636   UROBILINOGEN 0.2 07/13/2018 1210   NITRITE NEGATIVE 08/31/2019 1636   LEUKOCYTESUR NEGATIVE 08/31/2019 1636   Sepsis  Labs: @LABRCNTIP (procalcitonin:4,lacticidven:4) )No results found for this or any previous visit (from the past 240 hour(s)).   Radiological Exams on Admission: Ct Abdomen Pelvis W Contrast  Result Date: 08/31/2019 CLINICAL DATA:  Right-sided abdominal pain, abdominal distention, and nausea and vomiting for 2 days. Metastatic fallopian tube carcinoma. EXAM: CT ABDOMEN AND PELVIS WITH CONTRAST TECHNIQUE: Multidetector CT imaging of the abdomen and pelvis was performed using the standard protocol following bolus administration of intravenous contrast. CONTRAST:  58mL OMNIPAQUE IOHEXOL 300 MG/ML  SOLN COMPARISON:  07/05/2019 FINDINGS: Lower Chest: New small bilateral pleural effusions. Hepatobiliary: No hepatic masses identified. Prior cholecystectomy. No evidence of biliary obstruction. Pancreas:  No mass or inflammatory changes. Spleen: Within normal limits in size and appearance. Adrenals/Urinary Tract: No masses identified. No evidence of hydronephrosis. Stomach/Bowel: Mild dilatation of proximal small bowel is seen within the left upper and lower quadrants, with transition point in the left lower quadrant, consistent with partial small bowel obstruction. Diverticulosis is seen mainly involving the descending and sigmoid colon, however there is no evidence of diverticulitis. Vascular/Lymphatic: Stable mildly enlarged left paraaortic and left external iliac lymph nodes, largest measuring 11 mm on image 65/3. No new or increased lymphadenopathy identified. Aortic atherosclerosis. No abdominal aortic aneurysm. Reproductive: Prior hysterectomy noted. Adnexal regions are unremarkable in appearance. Increased diffuse mesenteric soft tissue stranding, and peritoneal contrast enhancement is seen as well as new mild ascites. This is consistent with increased peritoneal carcinomatosis. Other:  None. Musculoskeletal:  No suspicious bone lesions identified. IMPRESSION: Increased peritoneal carcinomatosis and mild  ascites since prior exam. Partial small bowel obstruction, with transition point in left lower quadrant, likely due to peritoneal carcinomatosis. Stable mildly enlarged left paraaortic and external iliac lymph nodes. Colonic diverticulosis, without radiographic evidence of diverticulitis. New small bilateral pleural effusions. Electronically Signed   By: Marlaine Hind M.D.   On: 08/31/2019 17:05      Assessment/Plan Principal Problem:   SBO (small bowel obstruction) (HCC) Active Problems:   Fallopian tube cancer, carcinoma ([place])   Essential hypertension   CKD (chronic kidney disease), stage III     #1 partial small bowel obstruction: NG tube inserted.  Supportive care only.  Patient not a candidate for any aggressive intervention.  Oncology consultation.  Continue supportive care.  #2  metastatic fallopian tube cancer: Palliative care only.  May consult oncology prior to discharge  #3 hypertension: Blood pressure is controlled  #4 chronic kidney disease stage III: Acute worsening.  Hydrate  #5 hyperlipidemia: Patient will be NGO.   DVT prophylaxis: Heparin Code Status: DNR Family Communication: Daughter at bedside Disposition Plan: To be determined Consults called: None.  Surgery consulted by ER but no follow-up expected Admission status: Inpatient  Severity of Illness: The appropriate patient status for this patient is INPATIENT. Inpatient status is judged to be reasonable and necessary in order to provide the required intensity of service to ensure the patient's safety. The patient's presenting symptoms, physical exam findings, and initial radiographic and laboratory data in the context of their chronic comorbidities is felt to place them at high risk for further clinical deterioration. Furthermore, it is not anticipated that the patient will be medically stable for discharge from the hospital within 2 midnights of admission. The following factors support the patient status of  inpatient.   " The patient's presenting symptoms include abdominal pain nausea vomiting. " The worrisome physical exam findings include distended abdomen. " The initial radiographic and laboratory data are worrisome because of CT evidence of small bowel obstruction. " The chronic co-morbidities include fallopian tube cancer.   * I certify that at the point of admission it is my clinical judgment that the patient will require inpatient hospital care spanning beyond 2 midnights from the point of admission due to high intensity of service, high risk for further deterioration and high frequency of surveillance required.Lisa Merino MD [facility] Pager [phone]  If 7PM-7AM, please contact night-coverage [email or web address] Password [email or web address]  08/31/2019, 10:58 PM

## 2019-08-31 NOTE — Telephone Encounter (Signed)
Call from palliative patient family this am reporting coffee grounds emesis. She had been considering hospice admission over this past week. Today family is requesting admission. Consultation with Dr. Antonieta Loveless who approved hospice admission. Will inform hospice admission team.

## 2019-08-31 NOTE — ED Triage Notes (Signed)
Cancer pt that was scheduled for first Hospice visit today.  Daughter reports vomiting since yesterday.  Unable to keep pain meds down.  Actively vomiting on arrival.

## 2019-09-01 DIAGNOSIS — K56609 Unspecified intestinal obstruction, unspecified as to partial versus complete obstruction: Secondary | ICD-10-CM

## 2019-09-01 LAB — CBC
HCT: 28.2 % — ABNORMAL LOW (ref 36.0–46.0)
Hemoglobin: 9.4 g/dL — ABNORMAL LOW (ref 12.0–15.0)
MCH: 35.3 pg — ABNORMAL HIGH (ref 26.0–34.0)
MCHC: 33.3 g/dL (ref 30.0–36.0)
MCV: 106 fL — ABNORMAL HIGH (ref 80.0–100.0)
Platelets: 246 10*3/uL (ref 150–400)
RBC: 2.66 MIL/uL — ABNORMAL LOW (ref 3.87–5.11)
RDW: 15.9 % — ABNORMAL HIGH (ref 11.5–15.5)
WBC: 8.7 10*3/uL (ref 4.0–10.5)
nRBC: 0 % (ref 0.0–0.2)

## 2019-09-01 LAB — COMPREHENSIVE METABOLIC PANEL
ALT: 11 U/L (ref 0–44)
AST: 16 U/L (ref 15–41)
Albumin: 2.5 g/dL — ABNORMAL LOW (ref 3.5–5.0)
Alkaline Phosphatase: 55 U/L (ref 38–126)
Anion gap: 12 (ref 5–15)
BUN: 35 mg/dL — ABNORMAL HIGH (ref 8–23)
CO2: 24 mmol/L (ref 22–32)
Calcium: 8.4 mg/dL — ABNORMAL LOW (ref 8.9–10.3)
Chloride: 103 mmol/L (ref 98–111)
Creatinine, Ser: 1.23 mg/dL — ABNORMAL HIGH (ref 0.44–1.00)
GFR calc Af Amer: 47 mL/min — ABNORMAL LOW (ref >60)
GFR calc non Af Amer: 41 mL/min — ABNORMAL LOW (ref >60)
Glucose, Bld: 140 mg/dL — ABNORMAL HIGH (ref 70–99)
Potassium: 4 mmol/L (ref 3.5–5.1)
Sodium: 139 mmol/L (ref 135–145)
Total Bilirubin: 0.3 mg/dL (ref 0.3–1.2)
Total Protein: 5.7 g/dL — ABNORMAL LOW (ref 6.5–8.1)

## 2019-09-01 LAB — SARS CORONAVIRUS 2 (TAT 6-24 HRS): SARS Coronavirus 2: NEGATIVE

## 2019-09-01 MED ORDER — MORPHINE SULFATE (PF) 2 MG/ML IV SOLN
1.0000 mg | Freq: Once | INTRAVENOUS | Status: AC
  Administered 2019-09-01: 1 mg via INTRAVENOUS

## 2019-09-01 MED ORDER — MORPHINE SULFATE (PF) 4 MG/ML IV SOLN
4.0000 mg | INTRAVENOUS | Status: DC | PRN
  Administered 2019-09-01 (×2): 4 mg via INTRAVENOUS
  Filled 2019-09-01 (×3): qty 1

## 2019-09-01 MED ORDER — MORPHINE BOLUS VIA INFUSION
4.0000 mg | INTRAVENOUS | Status: DC | PRN
  Filled 2019-09-01: qty 4

## 2019-09-01 MED ORDER — ONDANSETRON HCL 4 MG PO TABS
4.0000 mg | ORAL_TABLET | Freq: Four times a day (QID) | ORAL | Status: DC | PRN
  Administered 2019-09-04: 10:00:00 4 mg via ORAL
  Filled 2019-09-01: qty 1

## 2019-09-01 MED ORDER — DIPHENHYDRAMINE HCL 50 MG/ML IJ SOLN
12.5000 mg | Freq: Once | INTRAMUSCULAR | Status: AC
  Administered 2019-09-01: 12.5 mg via INTRAVENOUS
  Filled 2019-09-01: qty 1

## 2019-09-01 MED ORDER — HYDRALAZINE HCL 20 MG/ML IJ SOLN: 10.0000 mg | Freq: Four times a day (QID) | INTRAMUSCULAR | Status: DC | PRN

## 2019-09-01 MED ORDER — MORPHINE SULFATE (PF) 2 MG/ML IV SOLN: 2.0000 mg | Freq: Once | INTRAVENOUS | Status: DC

## 2019-09-01 MED ORDER — DEXTROSE-NACL 5-0.9 % IV SOLN
INTRAVENOUS | Status: DC
  Administered 2019-09-01 (×3): via INTRAVENOUS

## 2019-09-01 MED ORDER — ONDANSETRON HCL 4 MG/2ML IJ SOLN
4.0000 mg | Freq: Four times a day (QID) | INTRAMUSCULAR | Status: DC | PRN
  Administered 2019-09-01 – 2019-09-03 (×2): 4 mg via INTRAVENOUS
  Filled 2019-09-01 (×2): qty 2

## 2019-09-01 MED ORDER — MORPHINE SULFATE (PF) 4 MG/ML IV SOLN
4.0000 mg | INTRAVENOUS | Status: DC | PRN
  Administered 2019-09-01: 4 mg via INTRAVENOUS
  Filled 2019-09-01: qty 1

## 2019-09-01 MED ORDER — MORPHINE 100MG IN NS 100ML (1MG/ML) PREMIX INFUSION: 5.0000 mg/h | INTRAVENOUS | Status: DC

## 2019-09-01 MED ORDER — MORPHINE 100MG IN NS 100ML (1MG/ML) PREMIX INFUSION
3.0000 mg/h | INTRAVENOUS | Status: DC
  Administered 2019-09-01: 5 mg/h via INTRAVENOUS
  Administered 2019-09-02 – 2019-09-03 (×2): 3 mg/h via INTRAVENOUS
  Filled 2019-09-01 (×3): qty 100

## 2019-09-01 MED ORDER — MORPHINE SULFATE (PF) 4 MG/ML IV SOLN
4.0000 mg | INTRAVENOUS | Status: DC | PRN
  Administered 2019-09-01 (×2): 4 mg via INTRAVENOUS
  Filled 2019-09-01 (×2): qty 1

## 2019-09-01 MED ORDER — HEPARIN SODIUM (PORCINE) 5000 UNIT/ML IJ SOLN
5000.0000 [IU] | Freq: Three times a day (TID) | INTRAMUSCULAR | Status: DC
  Administered 2019-09-01 – 2019-09-03 (×3): 5000 [IU] via SUBCUTANEOUS
  Filled 2019-09-01 (×6): qty 1

## 2019-09-01 NOTE — Progress Notes (Signed)
Family member is stating that pateint is in extreme pain, she is cancer pateint. Pain medication not working here. At home she was taking 30mg  MS Contin every 8 hrs and 15 mg IR as needed per daughter. Paged MD. Primary nurse aware.

## 2019-09-01 NOTE — Progress Notes (Signed)
CSW acknowledges consult for residential hospice. Patient is awaiting bed at [facility]. WIll continue to follow for discharge planning needs.

## 2019-09-01 NOTE — Progress Notes (Addendum)
PROGRESS NOTE    Lisa Moyer  [id number] DOB: 10-09-1935 DOA: 08/31/2019 PCP: No primary care provider on file.   Brief Narrative:  Lisa Moyer is a 84 y.o. female with medical history significant of  metastatic ovarian and fallopian tube cancer , peritoneal carcinomatosis, hyperlipidemia and hypertension who is currently on palliative care presented with persistent nausea vomiting and abdominal pain. The abdominal pain has worsened despite being on narcotics.  No prior to intestinal or other GI's history.  Patient was seen in the ER with a evaluation showing partial small bowel obstruction due to abdominal worsening carcinomatosis.  She was started with NG tube for conservative measures and admitted under hospitalist service.  Assessment & Plan:   Principal Problem:   SBO (small bowel obstruction) (HCC) Active Problems:   Fallopian tube cancer, carcinoma (HCC)   Essential hypertension   CKD (chronic kidney disease), stage III   #1 partial small bowel obstruction:  Per daughter, patient has had 2 bowel movements.  However patient continues to have abdominal pain.  On exam she has diminished bowel sounds.  We will clamp NG tube and see how she does.  #2 metastatic fallopian tube cancer:  I received 3 messages within 5 minutes from 3 different people about patient's pain not being managed well.  I saw patient within 10 minutes.  Daughter at the bedside.  During my evaluation, patient multiple times expressed her wishes of being comfortable and requested pain medication.  After an extensive discussion among 3 of [place] myself, patient and her daughter, we mutually agreed upon starting her on morphine drip however daughter had another conversation afterwards with oncologist and expressed her wishes to take her mom home if her small bowel obstruction improves and thus they change their mind of morphine drip and instead we will start her on IV as needed morphine.  Apparently,  [facility] is on [facility] and she may get a bed either today or tomorrow.  #3 hypertension:  Blood pressure fluctuating.  Unable to take p.o.  Will start on IV as needed hydralazine.  #4 chronic kidney disease stage III: At baseline.  Continue hydration.  #5 hyperlipidemia:  Patient n.p.o.  Statin not indicated in this situation.  DVT prophylaxis: Heparin Code Status: DNR Family Communication: Discussed with daughter at the bedside. Disposition Plan: Either home or [facility]  Estimated body mass index is 26.22 kg/m as calculated from the following:   Height as of 08/29/19: 5\' 4"  (1.626 m).   Weight as of this encounter: 69.3 kg.      Nutritional status:               Consultants:   Palliative care and oncology  Procedures:   None   Antimicrobials:   None   Subjective: Seen and examined with daughter at the bedside.  Continues to complain of abdominal pain and continues to request to make her comfortable.  Objective: Vitals:   09/01/19 0137 09/01/19 0158 09/01/19 0610 09/01/19 1407  BP:  123/69 (!) 142/84 (!) 171/84  Pulse:  81 84 85  Resp:  20 18 16   Temp:  99.5 F (37.5 C) 98.3 F (36.8 C) 98.7 F (37.1 C)  TempSrc:  Oral Oral Axillary  SpO2:  100% 90% 92%  Weight: 69.3 kg       Intake/Output Summary (Last 24 hours) at 09/01/2019 1430 Last data filed at 09/01/2019 1300 Gross per 24 hour  Intake 1765.97 ml  Output 602 ml  Net 1163.97 ml   Filed Weights   09/01/19 0137  Weight: 69.3 kg    Examination:  General exam: Appears in pain Respiratory system: Decreased breath sounds at the bases. Respiratory effort normal. Cardiovascular system: S1 & S2 heard, RRR. No JVD, murmurs, rubs, gallops or clicks. No pedal edema. Gastrointestinal system: Abdomen is slightly distended, soft and generalized tender. no organomegaly or masses felt.  Diminished breath sounds Central nervous system: Alert and oriented. No focal  neurological deficits. Extremities: Symmetric 5 x 5 power. Skin: No rashes, lesions or ulcers Psychiatry: Judgement and insight appear normal. Mood & affect appropriate.    Data Reviewed: I have personally reviewed following labs and imaging studies  CBC: Recent Labs  Lab 08/31/19 1538 09/01/19 0547  WBC 17.4* 8.7  HGB 11.5* 9.4*  HCT 35.7* 28.2*  MCV 108.8* 106.0*  PLT 322 0000000   Basic Metabolic Panel: Recent Labs  Lab 08/31/19 1538 09/01/19 0547  NA 138 139  K 4.5 4.0  CL 103 103  CO2 23 24  GLUCOSE 162* 140*  BUN 47* 35*  CREATININE 1.38* 1.23*  CALCIUM 9.2 8.4*   GFR: Estimated Creatinine Clearance: 33.7 mL/min (A) (by C-G formula based on SCr of 1.23 mg/dL (H)). Liver Function Tests: Recent Labs  Lab 08/31/19 1538 09/01/19 0547  AST 20 16  ALT 14 11  ALKPHOS 74 55  BILITOT 0.6 0.3  PROT 6.8 5.7*  ALBUMIN 3.1* 2.5*   Recent Labs  Lab 08/31/19 1538  LIPASE 16   No results for input(s): AMMONIA in the last 168 hours. Coagulation Profile: No results for input(s): INR, PROTIME in the last 168 hours. Cardiac Enzymes: No results for input(s): CKTOTAL, CKMB, CKMBINDEX, TROPONINI in the last 168 hours. BNP (last 3 results) No results for input(s): PROBNP in the last 8760 hours. HbA1C: No results for input(s): HGBA1C in the last 72 hours. CBG: No results for input(s): GLUCAP in the last 168 hours. Lipid Profile: No results for input(s): CHOL, HDL, LDLCALC, TRIG, CHOLHDL, LDLDIRECT in the last 72 hours. Thyroid Function Tests: No results for input(s): TSH, T4TOTAL, FREET4, T3FREE, THYROIDAB in the last 72 hours. Anemia Panel: No results for input(s): VITAMINB12, FOLATE, FERRITIN, TIBC, IRON, RETICCTPCT in the last 72 hours. Sepsis Labs: No results for input(s): PROCALCITON, LATICACIDVEN in the last 168 hours.  Recent Results (from the past 240 hour(s))  SARS CORONAVIRUS 2 (TAT 6-24 HRS) Nasopharyngeal Nasopharyngeal Swab     Status: None   Collection  Time: 08/31/19 10:17 PM   Specimen: Nasopharyngeal Swab  Result Value Ref Range Status   SARS Coronavirus 2 NEGATIVE NEGATIVE Final    Comment: (NOTE) SARS-CoV-2 target nucleic acids are NOT DETECTED. The SARS-CoV-2 RNA is generally detectable in upper and lower respiratory specimens during the acute phase of infection. Negative results do not preclude SARS-CoV-2 infection, do not rule out co-infections with other pathogens, and should not be used as the sole basis for treatment or other patient management decisions. Negative results must be combined with clinical observations, patient history, and epidemiological information. The expected result is Negative. Fact Sheet for Patients: SugarRoll.be Fact Sheet for Healthcare Providers: https://www.woods-mathews.com/ This test is not yet approved or cleared by the [place] FDA and  has been authorized for detection and/or diagnosis of SARS-CoV-2 by FDA under an Emergency Use Authorization (EUA). This EUA will remain  in effect (meaning this test can be used) for the duration of the COVID-19 declaration under Section 56 4(b)(1) of the Act, 21  U.S.C. section 360bbb-3(b)(1), unless the authorization is terminated or revoked sooner. Performed at [facility], [place] [address], [place], [place] [address]       Radiology Studies: Ct Abdomen Pelvis W Contrast  Result Date: 08/31/2019 CLINICAL DATA:  Right-sided abdominal pain, abdominal distention, and nausea and vomiting for 2 days. Metastatic fallopian tube carcinoma. EXAM: CT ABDOMEN AND PELVIS WITH CONTRAST TECHNIQUE: Multidetector CT imaging of the abdomen and pelvis was performed using the standard protocol following bolus administration of intravenous contrast. CONTRAST:  31mL OMNIPAQUE IOHEXOL 300 MG/ML  SOLN COMPARISON:  07/05/2019 FINDINGS: Lower Chest: New small bilateral pleural effusions. Hepatobiliary: No hepatic masses  identified. Prior cholecystectomy. No evidence of biliary obstruction. Pancreas:  No mass or inflammatory changes. Spleen: Within normal limits in size and appearance. Adrenals/Urinary Tract: No masses identified. No evidence of hydronephrosis. Stomach/Bowel: Mild dilatation of proximal small bowel is seen within the left upper and lower quadrants, with transition point in the left lower quadrant, consistent with partial small bowel obstruction. Diverticulosis is seen mainly involving the descending and sigmoid colon, however there is no evidence of diverticulitis. Vascular/Lymphatic: Stable mildly enlarged left paraaortic and left external iliac lymph nodes, largest measuring 11 mm on image 65/3. No new or increased lymphadenopathy identified. Aortic atherosclerosis. No abdominal aortic aneurysm. Reproductive: Prior hysterectomy noted. Adnexal regions are unremarkable in appearance. Increased diffuse mesenteric soft tissue stranding, and peritoneal contrast enhancement is seen as well as new mild ascites. This is consistent with increased peritoneal carcinomatosis. Other:  None. Musculoskeletal:  No suspicious bone lesions identified. IMPRESSION: Increased peritoneal carcinomatosis and mild ascites since prior exam. Partial small bowel obstruction, with transition point in left lower quadrant, likely due to peritoneal carcinomatosis. Stable mildly enlarged left paraaortic and external iliac lymph nodes. Colonic diverticulosis, without radiographic evidence of diverticulitis. New small bilateral pleural effusions. Electronically Signed   By: Marlaine Hind M.D.   On: 08/31/2019 17:05   Dg Abd Portable 1 View  Result Date: 08/31/2019 CLINICAL DATA:  Nasogastric tube placement EXAM: PORTABLE ABDOMEN - 1 VIEW COMPARISON:  Portable exam 2326 hours compared to CT abdomen and pelvis 08/31/2019 FINDINGS: Nasogastric tube tip projects over gastric antrum. Atelectasis versus consolidation LEFT lower lobe. Paucity of bowel  gas. Bones demineralized. IMPRESSION: Tip of nasogastric tube projects over gastric antrum. Electronically Signed   By: Lavonia Dana M.D.   On: 08/31/2019 23:49    Scheduled Meds:  heparin  5,000 Units Subcutaneous Q8H   Continuous Infusions:  dextrose 5 % and 0.9% NaCl 125 mL/hr at 09/01/19 0930     LOS: 1 day   Time spent: 35 minutes   Darliss Cheney, MD [facility]  09/01/2019, 2:30 PM   To contact the attending provider between 7A-7P or the covering provider during after hours 7P-7A, please log into the web site [email or web address] and use password [email or web address].

## 2019-09-01 NOTE — Progress Notes (Signed)
HEMATOLOGY-ONCOLOGY PROGRESS NOTE  SUBJECTIVE: Patient is being admitted to the hospital because of intractable nausea vomiting and abdominal pain and CT showing small bowel obstruction.  NG tube was placed and she has had no further issues with emesis and she had 2 bowel movements. Patient's daughter was at bedside.  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  We are consulted to assist in the oncology management of the hospitalization.  Oncology History Overview Note  Neg genetics.  HRD test was done on peritoneal fluid from 07/18/2018: HRD +   Fallopian tube cancer, carcinoma ([place])  06/18/2016 Tumor Marker   Patient's tumor was tested for the following markers: CA-125 Results of the tumor marker test revealed 298.   06/23/2016 PET scan   Diffuse hypermetabolic peritoneal carcinomatosis in the abdomen and pelvis, internal mammary adenopathy in the chest, hypermetabolic right paracardiac/diaphragmatic LN, bilateral pleural effusion and moderate ascites   06/24/2016 Procedure   Therapeutic paracentesis   06/24/2016 Pathology Results   Positive for adenocarcinoma, Mullerian primary   06/28/2016 Procedure   She underwent CT guided biopsy of omentum   06/28/2016 Pathology Results   High grade serous involving the omentum, positive for p53, PAX8, WT1 and p16.   07/01/2016 Procedure   Findings/impression:   1. Sonographic evaluation of the right internal jugular vein confirms patency. Sonography was required to gain central venous access. An image documenting patency and needle access was saved.   2. Successful placement of right internal jugular vein subcutaneous Mediport as described. Spot film shows the catheter tip at the cavoatrial junction. No pneumothorax. The Mediport aspirates and flushes normally.   07/02/2016 - 08/12/2016 Chemotherapy   The patient had 3 cycles of carboplatin and taxol chemotherapy  treatment.     08/29/2016 Imaging   Ct scan of chest, abdomen and pelvis showed marked improvement and resolution of thoracic adenopathy   09/08/2016 Surgery   She underwent interval debulking surgery with TAH/BSO and infracolic omentectomy by Dr. Wynelle Cleveland   09/08/2016 Pathology Results   Right tube: scant serous carcinoma. No viable tumor. Left tube and ovary: normal. Omentum: low volume serous carcinoma with few psamomma bodies.   10/07/2016 - 11/18/2016 Chemotherapy   The patient had 3 more cycles of carboplatin and taxol for chemotherapy treatment.     02/07/2017 Imaging   Impression:  1. Colonic diverticulosis without acute diverticulitis. 2. Stable calcification right kidney without hydronephrosis. 3. No acute process or significant interval change has occurred since August 29, 2016.   05/09/2018 Imaging   Impression:  Findings are most suspicious for acute sigmoid colonic diverticulitis with intraloop abscess formation.   05/14/2018 Imaging   Impression: The pelvic fluid collection contiguous with sigmoid colonic diverticulitis changes has not significant change since May 09, 2018. No new fluid collections are seen   06/28/2018 Imaging   IMPRESSION: Changes consistent with diverticulitis with free fluid within the pelvis. No focal contained abscess is seen. A previously noted fluid collection deep within the pelvis has resolved in the interval.   Stable nonobstructing right renal stone.  The remainder of the exam is stable from the prior study.    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 There is omental caking as well as stranding within various areas of the peritoneal fat. There is also wall thickening of the sigmoid colon  with adjacent stranding. This finding can also be related to peritoneal carcinomatosis  and implants although an inflammatory process of the sigmoid colon is not excluded.   Right nephrolithiasis.  Ileus pattern.   07/16/2018 Imaging   Impression:   Findings of peritoneal carcinomatosis, rapidly progressive. Not present in July 2019, progressed when compared to September 13.  Multiple bowel loops are distorted but no distention or transition zone to suggest a component of active obstruction.   07/18/2018 Procedure   Findings/impression:  1. Sonographic evaluation of ascites. Sonography was required to gain access to ascites. An image documenting ascites was saved. 2. Successful ultrasound guided paracentesis using a temporary peritoneal drainage catheter inserted in the right upper quadrant as described yielding 600cc of ascites.    07/20/2018 Echocardiogram   General:  The patient was in normal sinus rhythm.  Left Ventricle:  Left ventricular ejection fraction was normal, estimated in the range of 60 to 65%. The left ventricular cavity size appears normal. The left ventricular wall thickness appears normal. The left ventricle is thickened in a fashion  consistent with mild concentric hypertrophy. Doppler tissue velocities and mitral inflow profile are consistent with Stage I diastolic dysfunction. The calculated ejection fraction is 68.1 %.  Right Ventricle:  The right ventricle appears normal in size and function.  Left Atrium:  The left atrium appears normal.  Right Atrium:  The right atrium appears normal.  Aortic Valve:  The structure of the aortic valve is tricuspid. There is evidence of trivial (trace) aortic regurgitation.  Mitral Valve:  There is no evidence of mitral regurgitation. The mitral valve appears normal in structure.  Pulmonic Valve:  There is evidence of trace (trivial) pulmonic regurgitation. The pulmonic valve appears normal in structure.  Tricuspid Valve:  There is evidence of trace (trivial) tricuspid regurgitation. The tricuspid valve  appears normal in structure.  Pericardium:  There is no evidence of pericardial effusion.  Aorta:  The visualized portions of the aorta (ascending aorta, aortic root, and aortic arch) appear normal.  Pulmonic Artery:  Unable to obtain RVSP due to insufficient tricuspid regurgitation jet.  Conclusions: Left ventricular ejection fraction was normal, estimated in the range of 60 to 65%. The left ventricular cavity size appears normal. The left ventricular wall thickness appears normal. The left ventricle is thickened in a fashion consistent with mild concentric hypertrophy. Doppler tissue velocities and mitral inflow profile are consistent with Stage I diastolic dysfunction. The calculated ejection fraction is 68.1%.There is evidence of trivial (trace) aortic regurgitation.There is evidence of trace (trivial) pulmonic regurgitation.There is evidence of trace (trivial) tricuspid regurgitation.There is no evidence of pericardial effusion.Unable to obtain RVSP due to insufficient tricuspid regurgitation jet.   07/23/2018 - 12/25/2018 Chemotherapy   The patient had carboplatin, doxil and Avastin for chemotherapy treatment x 6 cycles   08/01/2018 Imaging   Ct head showed no injuries.   08/01/2018 Procedure   Findings/impression:  1. Sonographic evaluation of ascites. Sonography was required to gain access to ascites. An image documenting ascites was saved. 2. Successful ultrasound guided paracentesis using a temporary peritoneal drainage catheter inserted in the right upper quadrant as described yielding 400cc of ascites.    08/06/2018 Tumor Marker   Patient's tumor was tested for the following markers: CA-125 Results of the tumor marker test revealed 373   08/20/2018 Tumor Marker   Patient's tumor was tested for the following markers: CA-125 Results of the tumor marker test revealed 51.   10/03/2018 Genetic Testing   Patient has  genetic testing done for genetics testing using her saliva. 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  Liver and spleen homogeneously enhance. No lesion seen.  The adjacent gallbladder is surgically absent.  The adrenal glands, kidneys, pancreas are normal.  Pelvic contents are remarkable for an absent uterus.  The mesenteric stranding has nearly completely resolved. Minimal reticular prominence throughout the pelvis.  No residual ascites.  No bowel wall thickening or. No dilated loops or transition zone.  The appendix is not seen. But no inflammatory changes in the right lower quadrant.  Osseous structures are intact.   11/27/2018 Imaging   Ct scan abdomen and pelvis showed colonic diverticulosis and nonspecific fluid levels in the bowel   12/24/2018 Cancer Staging   Staging form: Ovary, Fallopian Tube, and Primary Peritoneal Carcinoma, AJCC 8th Edition - Pathologic: Stage IVB (rpT3a, pN0, pM1b) - Signed by Heath Lark, MD on 12/24/2018   12/31/2018 Tumor Marker   Patient's tumor was tested for the following markers: CA-125 Results of the tumor marker test revealed 14.4   01/01/2019 Imaging   1. LEFT jugular Port-A-Cath catheter tip projects over the mid SVC. 2. No acute cardiopulmonary disease.    01/04/2019 Echocardiogram   1. The left ventricle has normal systolic function with an ejection fraction of 60-65%. The cavity size was normal. Left ventricular diastolic Doppler parameters are consistent with impaired relaxation.  2. The right ventricle has normal systolic function. The cavity was normal. There is no increase in right ventricular wall thickness.  3. The mitral valve is normal in structure.  4. The tricuspid valve is normal in structure.  5. The aortic valve is normal in structure.  6. The pulmonic valve was normal in structure.  7. 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    02/13/2019 -  Chemotherapy   The  patient is started on Lynparza due to [place]    04/22/2019 Tumor Marker   Patient's tumor was tested for the following markers: CA-125 Results of the tumor marker test revealed 44.8   04/26/2019 Imaging   1. No evidence of recurrent or metastatic carcinoma within the abdomen or pelvis. 2. Colonic diverticulosis, without radiographic evidence of diverticulitis or other acute findings.   Aortic Atherosclerosis (ICD10-I70.0).   05/28/2019 Tumor Marker   Patient's tumor was tested for the following markers: CA-125 Results of the tumor marker test revealed 104   07/05/2019 Imaging   1. Imaging findings consistent with recurrent peritoneal carcinomatosis. This is most apparent within the left abdomen involving the transverse colon and descending colon where there is increased soft tissue, loculated fluid and colonic serosal soft tissue thickening. 2. Increase in size of retroperitoneal and pelvic lymph nodes compatible with metastatic adenopathy. 3. No evidence for bowel obstruction.     07/05/2019 Tumor Marker   Patient's tumor was tested for the following markers: CA-125 Results of the tumor marker test revealed 233   07/15/2019 - 07/29/2019 Chemotherapy   The patient had 2 doses of gemzar, DC due to side-effects and progression    08/01/2019 - 08/03/2019 Hospital Admission   She was admitted to the hospital due to fever and pancytopenia She was placed on antibiotics treatment and received transfusion support   08/08/2019 Tumor Marker   Patient's tumor was tested for the following markers: CA-125 Results of the tumor marker test revealed 458     OBJECTIVE:  REVIEW OF SYSTEMS:   Constitutional: Denies fevers, chills or abnormal weight loss Eyes: Denies blurriness of vision Ears, nose, mouth, throat, and face: Denies mucositis or sore throat Respiratory: Denies cough, dyspnea or wheezes Cardiovascular: Denies palpitation, chest discomfort Gastrointestinal: Abdominal pain nausea and  vomiting Skin: Denies abnormal skin rashes Lymphatics: Denies new lymphadenopathy or easy bruising Neurological:Denies numbness, tingling or new weaknesses Behavioral/Psych: Patient is severely depressed and she does not have any interest in living. Extremities: No lower extremity edema  All other systems were reviewed with the patient and are negative.  I have reviewed the past medical history, past surgical history, social history and family history with the patient and they are unchanged from previous note.   PHYSICAL EXAMINATION: ECOG PERFORMANCE STATUS: 4 - Bedbound  Vitals:   09/01/19 0158 09/01/19 0610  BP: 123/69 (!) 142/84  Pulse: 81 84  Resp: 20 18  Temp: 99.5 F (37.5 C) 98.3 F (36.8 C)  SpO2: 100% 90%   Filed Weights   09/01/19 0137  Weight: 152 lb 12.5 oz (69.3 kg)    GENERAL:alert, no distress and comfortable SKIN: skin color, texture, turgor are normal, no rashes or significant lesions EYES: normal, Conjunctiva are pink and non-injected, sclera clear OROPHARYNX:no exudate, no erythema and lips, buccal mucosa, and tongue normal  NECK: supple, thyroid normal size, non-tender, without nodularity LYMPH:  no palpable lymphadenopathy in the cervical, axillary or inguinal LUNGS: clear to auscultation and percussion with normal breathing effort HEART: regular rate & rhythm and no murmurs and no lower extremity edema ABDOMEN: No further abdominal distention, abdominal pain is 5/10 Musculoskeletal:no cyanosis of digits and no clubbing  NEURO: alert & oriented x 3 with fluent speech, no focal motor/sensory deficits  LABORATORY DATA:  I have reviewed the data as listed CMP Latest Ref Rng & Units 09/01/2019 08/31/2019 08/22/2019  Glucose 70 - 99 mg/dL 140(H) 162(H) 116(H)  BUN 8 - 23 mg/dL 35(H) 47(H) 21  Creatinine 0.44 - 1.00 mg/dL 1.23(H) 1.38(H) 0.95  Sodium 135 - 145 mmol/L 139 138 138  Potassium 3.5 - 5.1 mmol/L 4.0 4.5 4.5  Chloride 98 - 111 mmol/L 103 103  107  CO2 22 - 32 mmol/L 24 23 22   Calcium 8.9 - 10.3 mg/dL 8.4(L) 9.2 8.9  Total Protein 6.5 - 8.1 g/dL 5.7(L) 6.8 6.9  Total Bilirubin 0.3 - 1.2 mg/dL 0.3 0.6 0.2(L)  Alkaline Phos 38 - 126 U/L 55 74 88  AST 15 - 41 U/L 16 20 15   ALT 0 - 44 U/L 11 14 7     Lab Results  Component Value Date   WBC 8.7 09/01/2019   HGB 9.4 (L) 09/01/2019   HCT 28.2 (L) 09/01/2019   MCV 106.0 (H) 09/01/2019   PLT 246 09/01/2019   NEUTROABS 2.8 08/22/2019    ASSESSMENT AND PLAN: 1.  Metastatic fallopian tube carcinoma with peritoneal carcinomatosis causing small bowel obstruction: NG tube has helped stop the emesis and she had 2 bowel movements. I discussed with the patient and her family as well as the hospitalist team that the NG tube can be clamped for 24 hours and if she does not have any further emesis, it can be removed.  Patient's wishes are to go home with hospice if possible.  She started to think that because of the initial severity of the abdominal pain and emesis, she would not be comfortable at home.  Currently the pain is under decent control and her emesis has stopped.   It is  possible for her to get pain medications and anxiety medications sublingually if needed.  Hospice can help facilitate these.  2.  I realize that there may be a bed available for her at [facility].  I will leave it up to the patient's family and hospitalist team to determine her placement issues. Because she moved her bowels, it is possible that she may be able to be managed at home with the excellent support by her daughter.  3.  However, if the clamping of the NG tube leads to increased emesis, then the NG tube may need to remain in place and [facility] would be a reasonable option in that situation.

## 2019-09-01 NOTE — Progress Notes (Signed)
Manufacturing engineer [facility]) Hospital Liaison note.   This patient was to be admitted to hospice services on 10/31 but was admitted to the hospital instead. She is on the list for [facility] with tentative admission later today or tomorrow.    Thank you,   Farrel Gordon, RN, [facility]   [place]     [place] are on AM[facility]

## 2019-09-01 NOTE — Progress Notes (Addendum)
Page to Pahwani,MD per family member request.  [id number] Family Requesting MD at bedside; r/t 'inadequate pain control'.  MD returned call, transferred to daughter at bedside.

## 2019-09-01 NOTE — ED Notes (Signed)
ED TO INPATIENT HANDOFF REPORT  ED Nurse Name and Phone #: Lovell Sheehan [phone]  S Name/Age/Gender Lisa Moyer 84 y.o. female Room/Bed: 018C/018C  Code Status   Code Status: DNR  Home/SNF/Other Home Patient oriented to: self, place, time and situation Is this baseline? Yes   Triage Complete: Triage complete  Chief Complaint N/V  Triage Note Cancer pt that was scheduled for first Hospice visit today.  Daughter reports vomiting since yesterday.  Unable to keep pain meds down.  Actively vomiting on arrival.    Allergies Allergies  Allergen Reactions  . Latex Other (See Comments)    She has a sensitivity to [facility] so prior drs told her to avoid latex and listed it as an allergy    Level of Care/Admitting Diagnosis ED Disposition    ED Disposition Condition [place]: [place] [[id number]]  Level of Care: Med-Surg [16]  Covid Evaluation: Asymptomatic Screening Protocol (No Symptoms)  Diagnosis: SBO (small bowel obstruction) ([name]) [id number]  Admitting Physician: Elwyn Reach [[id number]]  Attending Physician: Elwyn Reach [[id number]]  Estimated length of stay: past midnight tomorrow  Certification:: I certify this patient will need inpatient services for at least 2 midnights  PT Class (Do Not Modify): Inpatient [101]  PT Acc Code (Do Not Modify): Private [1]       B Medical/Surgery History Past Medical History:  Diagnosis Date  . Arthritis    mild  . Blood transfusion without reported diagnosis   . DDD (degenerative disc disease), lumbar 05/09/2018   CT shows multilevel degenerative hypertrophic changes worst at L2-3  . Diverticulitis   . Fallopian tube cancer, carcinoma ([place])   . Fatty liver 06/28/2018   seen on CT  . Hyperlipemia   . Hypertension   . Kidney stone 05/2018   Right 45mm nonobstructing renal stone seen on CT - pt asymptomatic  . Pneumonia   . Sliding hiatal hernia 06/28/2018   seen on CT   Past Surgical  History:  Procedure Laterality Date  . ABDOMINAL HYSTERECTOMY  08/2016   total with BSO due to fallopian tube carcinoma, and pre-adjuvent and post-operative chemo as well each x 3 mos  . CHOLECYSTECTOMY  2001  . OTHER SURGICAL HISTORY  2017-2018   Chemotherapy      A IV Location/Drains/Wounds Patient Lines/Drains/Airways Status   Active Line/Drains/Airways    Name:   Placement date:   Placement time:   Site:   Days:   Peripheral IV 08/31/19 Right Forearm   08/31/19    1623    Forearm   1   NG/OG Tube Nasogastric 16 Fr. Right nare Aucultation Measured external length of tube   08/31/19    2231    Right nare   1          Intake/Output Last 24 hours  Intake/Output Summary (Last 24 hours) at 09/01/2019 0100 Last data filed at 08/31/2019 1813 Gross per 24 hour  Intake 1000 ml  Output -  Net 1000 ml    Labs/Imaging Results for orders placed or performed during the hospital encounter of 08/31/19 (from the past 48 hour(s))  Lipase, blood     Status: None   Collection Time: 08/31/19  3:38 PM  Result Value Ref Range   Lipase 16 11 - 51 U/L    Comment: Performed at [facility], [address], [place], [place] [address]  Comprehensive metabolic panel     Status: Abnormal  Collection Time: 08/31/19  3:38 PM  Result Value Ref Range   Sodium 138 135 - 145 mmol/L   Potassium 4.5 3.5 - 5.1 mmol/L   Chloride 103 98 - 111 mmol/L   CO2 23 22 - 32 mmol/L   Glucose, Bld 162 (H) 70 - 99 mg/dL   BUN 47 (H) 8 - 23 mg/dL   Creatinine, Ser 1.38 (H) 0.44 - 1.00 mg/dL   Calcium 9.2 8.9 - 10.3 mg/dL   Total Protein 6.8 6.5 - 8.1 g/dL   Albumin 3.1 (L) 3.5 - 5.0 g/dL   AST 20 15 - 41 U/L   ALT 14 0 - 44 U/L   Alkaline Phosphatase 74 38 - 126 U/L   Total Bilirubin 0.6 0.3 - 1.2 mg/dL   GFR calc non Af Amer 36 (L) >60 mL/min   GFR calc Af Amer 41 (L) >60 mL/min   Anion gap 12 5 - 15    Comment: Performed at [facility], [address], [place], [place] [address]  CBC      Status: Abnormal   Collection Time: 08/31/19  3:38 PM  Result Value Ref Range   WBC 17.4 (H) 4.0 - 10.5 K/uL   RBC 3.28 (L) 3.87 - 5.11 MIL/uL   Hemoglobin 11.5 (L) 12.0 - 15.0 g/dL   HCT 35.7 (L) 36.0 - 46.0 %   MCV 108.8 (H) 80.0 - 100.0 fL   MCH 35.1 (H) 26.0 - 34.0 pg   MCHC 32.2 30.0 - 36.0 g/dL   RDW 16.0 (H) 11.5 - 15.5 %   Platelets 322 150 - 400 K/uL   nRBC 0.0 0.0 - 0.2 %    Comment: Performed at [facility], [place] [address] [place], [place] [address]  Urinalysis, Routine w reflex microscopic     Status: None   Collection Time: 08/31/19  4:36 PM  Result Value Ref Range   Color, Urine YELLOW YELLOW   APPearance CLEAR CLEAR   Specific Gravity, Urine 1.020 1.005 - 1.030   pH 5.0 5.0 - 8.0   Glucose, UA NEGATIVE NEGATIVE mg/dL   Hgb urine dipstick NEGATIVE NEGATIVE   Bilirubin Urine NEGATIVE NEGATIVE   Ketones, ur NEGATIVE NEGATIVE mg/dL   Protein, ur NEGATIVE NEGATIVE mg/dL   Nitrite NEGATIVE NEGATIVE   Leukocytes,Ua NEGATIVE NEGATIVE    Comment: Performed at [facility] [address], [place], [place] [address]   Ct Abdomen Pelvis W Contrast  Result Date: 08/31/2019 CLINICAL DATA:  Right-sided abdominal pain, abdominal distention, and nausea and vomiting for 2 days. Metastatic fallopian tube carcinoma. EXAM: CT ABDOMEN AND PELVIS WITH CONTRAST TECHNIQUE: Multidetector CT imaging of the abdomen and pelvis was performed using the standard protocol following bolus administration of intravenous contrast. CONTRAST:  38mL OMNIPAQUE IOHEXOL 300 MG/ML  SOLN COMPARISON:  07/05/2019 FINDINGS: Lower Chest: New small bilateral pleural effusions. Hepatobiliary: No hepatic masses identified. Prior cholecystectomy. No evidence of biliary obstruction. Pancreas:  No mass or inflammatory changes. Spleen: Within normal limits in size and appearance. Adrenals/Urinary Tract: No masses identified. No evidence of hydronephrosis. Stomach/Bowel: Mild dilatation of proximal small bowel  is seen within the left upper and lower quadrants, with transition point in the left lower quadrant, consistent with partial small bowel obstruction. Diverticulosis is seen mainly involving the descending and sigmoid colon, however there is no evidence of diverticulitis. Vascular/Lymphatic: Stable mildly enlarged left paraaortic and left external iliac lymph nodes, largest measuring 11 mm on image 65/3. No new  or increased lymphadenopathy identified. Aortic atherosclerosis. No abdominal aortic aneurysm. Reproductive: Prior hysterectomy noted. Adnexal regions are unremarkable in appearance. Increased diffuse mesenteric soft tissue stranding, and peritoneal contrast enhancement is seen as well as new mild ascites. This is consistent with increased peritoneal carcinomatosis. Other:  None. Musculoskeletal:  No suspicious bone lesions identified. IMPRESSION: Increased peritoneal carcinomatosis and mild ascites since prior exam. Partial small bowel obstruction, with transition point in left lower quadrant, likely due to peritoneal carcinomatosis. Stable mildly enlarged left paraaortic and external iliac lymph nodes. Colonic diverticulosis, without radiographic evidence of diverticulitis. New small bilateral pleural effusions. Electronically Signed   By: Marlaine Hind M.D.   On: 08/31/2019 17:05   Dg Abd Portable 1 View  Result Date: 08/31/2019 CLINICAL DATA:  Nasogastric tube placement EXAM: PORTABLE ABDOMEN - 1 VIEW COMPARISON:  Portable exam 2326 hours compared to CT abdomen and pelvis 08/31/2019 FINDINGS: Nasogastric tube tip projects over gastric antrum. Atelectasis versus consolidation LEFT lower lobe. Paucity of bowel gas. Bones demineralized. IMPRESSION: Tip of nasogastric tube projects over gastric antrum. Electronically Signed   By: Lavonia Dana M.D.   On: 08/31/2019 23:49    Pending Labs Unresulted Labs (From admission, onward)    Start     Ordered   09/01/19 0500  Comprehensive metabolic panel   Tomorrow morning,   R     09/01/19 0024   09/01/19 0500  CBC  Tomorrow morning,   R     09/01/19 0024   08/31/19 2211  SARS CORONAVIRUS 2 (TAT 6-24 HRS) Nasopharyngeal Nasopharyngeal Swab  (Asymptomatic/Tier 2 Patients Labs)  Once,   STAT    Question Answer Comment  Is this test for diagnosis or screening Screening   Symptomatic for COVID-19 as defined by CDC No   Hospitalized for COVID-19 No   Admitted to ICU for COVID-19 No   Previously tested for COVID-19 No   Resident in a congregate (group) care setting No   Employed in healthcare setting No   Pregnant No      08/31/19 2210          Vitals/Pain Today's Vitals   08/31/19 2215 08/31/19 2300 08/31/19 2345 09/01/19 0045  BP: 125/67 (!) 144/86 (!) 147/90 (!) 146/86  Pulse: 85 86 83 84  Resp: (!) 21 (!) 26 (!) 25 (!) 23  Temp:      TempSrc:      SpO2: 97% 97% 97% 96%  PainSc:        Isolation Precautions No active isolations  Medications Medications  heparin injection 5,000 Units (5,000 Units Subcutaneous Given 09/01/19 0044)  dextrose 5 %-0.9 % sodium chloride infusion ( Intravenous New Bag/Given 09/01/19 0048)  ondansetron (ZOFRAN) tablet 4 mg (has no administration in time range)    Or  ondansetron (ZOFRAN) injection 4 mg (has no administration in time range)  morphine 4 MG/ML injection 4 mg (4 mg Intravenous Given 09/01/19 0044)  sodium chloride flush (NS) 0.9 % injection 3 mL (3 mLs Intravenous Given 08/31/19 1624)  ondansetron (ZOFRAN) injection 4 mg (4 mg Intravenous Given 08/31/19 1616)  HYDROmorphone (DILAUDID) injection 1 mg (1 mg Intravenous Given 08/31/19 1616)  lactated ringers bolus 1,000 mL (0 mLs Intravenous Stopped 08/31/19 1813)  iohexol (OMNIPAQUE) 300 MG/ML solution 75 mL (75 mLs Intravenous Contrast Given 08/31/19 1646)  lidocaine (XYLOCAINE) 2 % viscous mouth solution 15 mL (15 mLs Mouth/Throat Given 08/31/19 2212)  LORazepam (ATIVAN) injection 1 mg (1 mg Intravenous Given 08/31/19 2053)     Mobility  walks Low fall risk   Focused Assessments    R Recommendations: See Admitting Provider Note  Report given to:   Additional Notes:

## 2019-09-01 NOTE — Progress Notes (Addendum)
[name], made 10 requests within one hour with each request spaced 5-6 minutes apart. This occurred between [phone].    I addressed each and every request but daughter was not satisfied.  Concerns about wanting to eat were address by me earlier in the day through paging the provider. A total of 5 requests were made regarding NG tube status with the daughter wanting to know when the NG tube would be removed.  These requests were made repeatedly even though I had addressed them.  The provider Pahwani, MD even commented that the daughter's requests were excessive.  Four times, I addressed that I was awaiting for pharmacy to prepare the morphine gtt and I was not able to administer to morphine gtt since the pharmacy said that I would be another 15 minutes.  The daughter angrily asked "why haven't you given her the morphine drip".  I was on the phone with pharmacy at the time and stated "I am on the phone with pharmacy now, and they are preparing it now".  The daughter was not satisfied with this response.  [name] became very angry stating that "her pain was not well controlled and this was unacceptable".  Issues relating to her pain control was addressed through several pages to the provider Dr. Doristine Bosworth, MD though out the day by both myself and the charge nurse, Lisa Moron, RN. Please see note documenting the call to the provider about her home dose regimen.  The provider responded by increasing the frequency of her morphine 4 mg injection from every 4 hrs to every 3 hrs and then every 2 hrs.  I administered 4 mg morphine at the frequency based on orders by Dr. Doristine Bosworth, MD.  The last dose of morphine 4 mg IV was given at 1713 which was scheduled for every 2 hr, so less than 2 hrs had elapsed since her last dose, so it was not time for another dose when the daughter said that "something must be done".  Also, this order was discontinued when the morphine gtt order was placed.  I  paged two mid-level providers X. Blount NP and Bodenheimer, NP to address her pain which was stated by her daughter to be a 7.7/10.  Lisa Corpus NP stated that she did not want to give additional pain medications until at least one hour after the morphine drip has started.  The daughter was furious and stated "this is unacceptable I will not tolerate this".  I stated to the daughter that "I will continue call the provider again".  I also recruited the charge nurse Lisa Moyer, several other nurses, and Ronalee Belts the night shift nurse to assess the patient.   Vital signs were obtained and the provider Lisa Corpus, NP discussed the patient's pain control needs with the patient's daughter.  In an effort to resolve the daughter's concerns, I stayed with the patient until 2015.  At that time orders were obtained from Lisa Corpus, NP.  Ronalee Belts, RN took over care and will continue to monitor the patient.

## 2019-09-02 ENCOUNTER — Telehealth: Payer: Self-pay

## 2019-09-02 DIAGNOSIS — N1831 Chronic kidney disease, stage 3a: Secondary | ICD-10-CM

## 2019-09-02 DIAGNOSIS — Z66 Do not resuscitate: Secondary | ICD-10-CM

## 2019-09-02 DIAGNOSIS — K56600 Partial intestinal obstruction, unspecified as to cause: Secondary | ICD-10-CM

## 2019-09-02 DIAGNOSIS — C5700 Malignant neoplasm of unspecified fallopian tube: Secondary | ICD-10-CM

## 2019-09-02 DIAGNOSIS — G893 Neoplasm related pain (acute) (chronic): Secondary | ICD-10-CM

## 2019-09-02 DIAGNOSIS — I10 Essential (primary) hypertension: Secondary | ICD-10-CM

## 2019-09-02 DIAGNOSIS — Z515 Encounter for palliative care: Secondary | ICD-10-CM

## 2019-09-02 MED ORDER — MORPHINE SULFATE (CONCENTRATE) 10 MG/0.5ML PO SOLN
5.0000 mg | ORAL | Status: DC | PRN
  Administered 2019-09-03: 5 mg via ORAL
  Filled 2019-09-02 (×2): qty 0.5

## 2019-09-02 NOTE — Progress Notes (Signed)
Patient started on clear liquid diet this morning, tolerated well, no N/V, bowel sound active, MD made aware of above, ordered RN to removed NG tube. NG tube has been remove.  Daughter is also concern about when sublingual morphine will be order.

## 2019-09-02 NOTE — Consult Note (Signed)
Consultation Note Date: 09/02/2019   Patient Name: Lisa Moyer  DOB: 1935-02-12  MRN: [id number]  Age / Sex: 84 y.o., female  PCP: No primary care provider on file. Referring Physician: Kathie Dike, MD  Reason for Consultation: Establishing goals of care and Psychosocial/spiritual support  HPI/Patient Profile: 84 y.o. female admitted on 08/31/2019 with significant past medical history significant of ovarian and fallopian tube cancer which is metastatic, peritoneal carcinomatosis, hyperlipidemia and hypertension who is currently on palliative care/community based presenting with persistent nausea vomiting and abdominal pain.    Patient has had multiple episodes of coffee brown emesis   Abdominal pain has worsened      Patient was seen in the ER with a evaluation showing partial small bowel obstruction.  She is  admitted therefore for management of small bowel obstruction.  Patient is under palliative care with no ongoing treatment for her cancer.  ED Course: Temperature 99.5 blood pressure 170/142 pulse 95 respirate 28 oxygen sat 90% room air.  White count 17.4 hemoglobin 11.5 and platelets 322.  Glucose 162, BUN 47 creatinine 1.38.  CT abdomen and pelvis showed increased peritoneal carcinomatosis and mild ascites.  Partial small bowel obstruction with transition point in left lower quadrant probably due to peritoneal carcinomatosis.  NG tube inserted and patient is being admitted for treatment  Patient and family face treatment option decisions, advanced directive decisions and anticipatory care needs.   Clinical Assessment and Goals of Care:  This NP Wadie Lessen reviewed medical records, received report from the team  and then spoke to daughter by phone to discuss diagnosis, prognosis, GOC, EOL wishes disposition and options.  Concept of Hospice and Palliative Care were discussed  Patient is  currently "doing well" according to her daughter.  She is up walking,  and pain is controlled.   Hope is for patient to return home with hospice service in place, daughter acknowledges that the  need to transition to residential hospice may arise quickly.  We discussed in detail consideration, risk and benefits of home with hospice vs residential hospice.  We need to monitor and re-evaluate in 24 hours  A detailed discussion was had today regarding advanced directives.  Concepts specific to code status, artifical feeding and hydration, continued IV antibiotics and rehospitalization was had.  The difference between a aggressive medical intervention path  and a palliative comfort care path for this patient at this time was had.  Values and goals of care important to patient and family were attempted to be elicited.   Natural trajectory and expectations at EOL were discussed.  Questions and concerns addressed.   Family encouraged to call with questions or concerns.    PMT will continue to support holistically.   SUMMARY OF RECOMMENDATIONS    Code Status/Advance Care Planning:  DNR   Symptom Management:   Pain- Currently on morphine gtt.// will begin decreasing IV gtt dose in hopes of transitioning to oral medication for an easy transition home with hospice.  Will add prn Roxanol   Nausea-  Zofran 4 mg IV every 6 hours   Additional Recommendations (Limitations, Scope, Preferences):  Full Comfort Care  Psycho-social/Spiritual:   Desire for further Chaplaincy support:no  Additional Recommendations: Education on Hospice  Prognosis:   < 6 weeks  Discharge Planning: Home with Hospice      Primary Diagnoses: Present on Admission: . Fallopian tube cancer, carcinoma ([place]) . Essential hypertension . CKD (chronic kidney disease), stage III . SBO (small bowel obstruction) ([place])   I have reviewed the medical record, interviewed the patient and family, and examined the patient.  The following aspects are pertinent.  Past Medical History:  Diagnosis Date  . Arthritis    mild  . Blood transfusion without reported diagnosis   . DDD (degenerative disc disease), lumbar 05/09/2018   CT shows multilevel degenerative hypertrophic changes worst at L2-3  . Diverticulitis   . Fallopian tube cancer, carcinoma ([place])   . Fatty liver 06/28/2018   seen on CT  . Hyperlipemia   . Hypertension   . Kidney stone 05/2018   Right 52mm nonobstructing renal stone seen on CT - pt asymptomatic  . Pneumonia   . Sliding hiatal hernia 06/28/2018   seen on CT   Social History   Socioeconomic History  . Marital status: Widowed    Spouse name: Not on file  . Number of children: 3  . Years of education: Not on file  . Highest education level: Not on file  Occupational History  . Occupation: retired Education officer, community  . Financial resource strain: Not on file  . Food insecurity    Worry: Not on file    Inability: Not on file  . Transportation needs    Medical: Not on file    Non-medical: Not on file  Tobacco Use  . Smoking status: Never Smoker  . Smokeless tobacco: Never Used  Substance and Sexual Activity  . Alcohol use: Never    Frequency: Never  . Drug use: Never  . Sexual activity: Not on file  Lifestyle  . Physical activity    Days per week: Not on file    Minutes per session: Not on file  . Stress: Not on file  Relationships  . Social Herbalist on phone: Not on file    Gets together: Not on file    Attends religious service: Not on file    Active member of club or organization: Not on file    Attends meetings of clubs or organizations: Not on file    Relationship status: Not on file  Other Topics Concern  . Not on file  Social History Narrative  . Not on file   Family History  Problem Relation Age of Onset  . Heart disease Mother        MI cause of death  . Colon cancer Mother        colon  . Pancreatic cancer Father        pancreatic    Scheduled Meds: . heparin  5,000 Units Subcutaneous Q8H   Continuous Infusions: . morphine 5 mg/hr (09/01/19 1918)   PRN Meds:.hydrALAZINE, ondansetron **OR** ondansetron (ZOFRAN) IV Medications Prior to Admission:  Prior to Admission medications   Medication Sig Start Date End Date Taking? Authorizing Provider  acetaminophen (TYLENOL) 500 MG tablet Take 1,000 mg by mouth every 6 (six) hours as needed for moderate pain.   Yes [provider]  dexamethasone (DECADRON) 2 MG tablet Take 1 tablet (2 mg total) by mouth  daily. 08/22/19  Yes Heath Lark, MD  levothyroxine (SYNTHROID) 25 MCG tablet Take 1 tablet (25 mcg total) by mouth daily before breakfast. 08/22/19  Yes Gorsuch, Ni, MD  losartan (COZAAR) 100 MG tablet Take 100 mg by mouth daily.   Yes [provider]  morphine (MS CONTIN) 30 MG 12 hr tablet Take 1 tablet (30 mg total) by mouth every 8 (eight) hours. 08/22/19  Yes Gorsuch, Ni, MD  morphine (MSIR) 30 MG tablet Take 1 tablet (30 mg total) by mouth every 4 (four) hours as needed for severe pain. 08/29/19  Yes Gorsuch, Ni, MD  ondansetron (ZOFRAN ODT) 8 MG disintegrating tablet Take 1 tablet (8 mg total) by mouth every 8 (eight) hours as needed for nausea or vomiting. 07/18/19  Yes Gorsuch, Ni, MD  polyethylene glycol (MIRALAX / GLYCOLAX) 17 g packet Take 17 g by mouth daily.    Yes [provider]  senna (SENOKOT) 8.6 MG tablet Take 1 tablet by mouth daily.    Yes Serpe, Aletha Halim, NP  amLODipine (NORVASC) 5 MG tablet Take 1 tablet (5 mg total) by mouth daily. Patient not taking: Reported on 08/24/2019 08/08/19   Heath Lark, MD  atenolol (TENORMIN) 25 MG tablet Take 0.5 tablets (12.5 mg total) by mouth daily. Patient not taking: Reported on 08/24/2019 02/21/19   Heath Lark, MD   Allergies  Allergen Reactions  . Latex Other (See Comments)    She has a sensitivity to [facility] so prior drs told her to avoid latex and listed it as an allergy   Review of Systems   Physical Exam  Vital Signs: BP 106/73 (BP Location: Left Arm)   Pulse 88   Temp 98.9 F (37.2 C) (Oral)   Resp 17   Wt 68.5 kg   SpO2 92%   BMI 25.92 kg/m  Pain Scale: 0-10 POSS *See Group Information*: 1-Acceptable,Awake and alert Pain Score: 0-No pain   SpO2: SpO2: 92 % O2 Device:SpO2: 92 % O2 Flow Rate: .O2 Flow Rate (L/min): 2 L/min  IO: Intake/output summary:   Intake/Output Summary (Last 24 hours) at 09/02/2019 1502 Last data filed at 09/02/2019 0535 Gross per 24 hour  Intake 1715.83 ml  Output 250 ml  Net 1465.83 ml    LBM: Last BM Date: 09/01/19 Baseline Weight: Weight: 69.3 kg Most recent weight: Weight: 68.5 kg     Palliative Assessment/Data: 30 %   Discussed with Dr Hillard Danker and [place]  Time In: 1400 Time Out: 1530 Time Total: 75 minutes Greater than 50%  of this time was spent counseling and coordinating care related to the above assessment and plan.  Signed by: Wadie Lessen, NP   The above conversation was completed via telephone due to the  restrictions during the COVID-19 pandemic. Thorough chart review and discussion with necessary members of the care team was completed as part of assessment. All issues were discussed and addressed but no physical exam was performed.   Please contact Palliative Medicine Team phone at [phone] for questions and concerns.  For individual provider: See Shea Evans

## 2019-09-02 NOTE — TOC Initial Note (Signed)
Transition of Care [facility]) - Initial/Assessment Note    Patient Details  Name: Lisa Moyer MRN: [id number] Date of Birth: Mar 16, 1935  [facility]) CM/SW Contact:    Zenon Mayo, RN Phone Number: 09/02/2019, 7:33 PM  Clinical Narrative:                 Per Palliative meeting, daughter wants home with hospice with [facility], and transport via ptar.  Maryann with [facility] is aware. TOC will continue to follow for needs.  Expected Discharge Plan: Home w Hospice Care Barriers to Discharge: No Barriers Identified   Patient Goals and CMS Choice Patient states their goals for this hospitalization and ongoing recovery are:: home wiht hospice      Expected Discharge Plan and Services Expected Discharge Plan: [place]   Discharge Planning Services: CM Consult Post Acute Care Choice: Hospice Living arrangements for the past 2 months: Apartment                 DME Arranged: (NA)         HH Arranged: RN [place] Agency: Hospice and [place] Date HH Agency Contacted: 09/02/19 Time HH Agency Contacted: 1932 Representative spoke with at [place]: [facility]  Prior Living Arrangements/Services Living arrangements for the past 2 months: [place] with:: Self Patient language and need for interpreter reviewed:: Yes        Need for Family Participation in Patient Care: Yes (Comment) Care giver support system in place?: Yes (comment)   Criminal Activity/Legal Involvement Pertinent to Current Situation/Hospitalization: No - Comment as needed  Activities of Daily Living      Permission Sought/Granted                  Emotional Assessment         Alcohol / Substance Use: Not Applicable Psych Involvement: No (comment)  Admission diagnosis:  Partial small bowel obstruction ([place]) [K56.600] Patient Active Problem List   Diagnosis Date Noted  . SBO (small bowel obstruction) ([place]) 08/31/2019  . Other  constipation 08/29/2019  . Malignant cachexia ([place]) 08/22/2019  . Cellulitis of left leg 08/01/2019  . Sepsis ([place]) 08/01/2019  . Acute metabolic encephalopathy [id number]  . Hyponatremia 08/01/2019  . Recurrent fever 07/22/2019  . Cancer associated pain 07/05/2019  . Ovarian cancer ([place]) 04/23/2019  . Insomnia disorder 04/04/2019  . Acquired hypothyroidism 03/15/2019  . Abdominal pain 02/22/2019  . Other proteinuria 02/22/2019  . CKD (chronic kidney disease), stage III 02/22/2019  . Nausea & vomiting 02/22/2019  . Port-A-Cath in place 02/07/2019  . Physical debility 01/11/2019  . Goals of care, counseling/discussion 01/11/2019  . Dyspnea on exertion 01/01/2019  . Pancytopenia, acquired ([place]) 01/01/2019  . Essential hypertension 01/01/2019  . Anemia due to antineoplastic chemotherapy 12/31/2018  . Deficiency anemia 12/31/2018  . Diarrhea 12/31/2018  . Other fatigue 12/31/2018  . Fallopian tube cancer, carcinoma ([place]) 12/24/2018  . Diverticulitis of colon 06/27/2018   PCP:  No primary care provider on file. Pharmacy:   [place] [place] [address] [place] [address] Phone: [phone] Fax: [phone]     Social Determinants of Health (SDOH) Interventions    Readmission Risk Interventions Readmission Risk Prevention Plan 09/02/2019  Transportation Screening Complete  PCP or Specialist Appt within 3-5 Days Complete  HRI or [place] Complete  Social Work Consult for [place] Planning/Counseling Complete  Palliative Care Screening Complete  Medication Review Press photographer) Complete  Some recent data might be hidden

## 2019-09-02 NOTE — Progress Notes (Signed)
Hydrologist [facility])  Hospital Liaison: RN note    Notified by Transition of Care Manger of patient/family request residential hospice over the weekend. The patient and family have elected to go home with hospice support instead.     Writer spoke with daughter Oneita Jolly to initiate education related to hospice philosophy, services and team approach to care.           verbalized understanding of information given. Per discussion, plan is for discharge to home by PTAR.     Please send signed and completed DNR form home with patient/family. Patient will need prescriptions for discharge comfort medications. If Patient continues on IV morphine this will need to be arranged with prior to discharge.    DME needs have been discussed, patient currently has the following equipment in the home:  none.  Patient/family requests the following DME for delivery to the home: W/C and 3N1 . [place] equipment manager has been notified and will contact [employer] to arrange delivery to the home. Home address has been verified and is correct in the chart.  [name]   is the family member to contact to arrange time of delivery.     [facility] aware of the above. Please notify [facility] when patient is ready to leave the unit at discharge. (Call [phone] or [phone] after 5pm.) [facility] information and contact numbers given to   .      Please call with any hospice related questions.     Thank you for this referral.     Farrel Gordon, RN, CCM  [place] (listed on AMION under Hospice and [place] [facility] [place])  [phone]

## 2019-09-02 NOTE — Progress Notes (Signed)
PROGRESS NOTE    Lisa Moyer  [id number] DOB: October 14, 1935 DOA: 08/31/2019 PCP: No primary care provider on file.   Brief Narrative:  Lisa Moyer is a 84 y.o. female with medical history significant of  metastatic ovarian and fallopian tube cancer , peritoneal carcinomatosis, hyperlipidemia and hypertension who is currently on palliative care presented with persistent nausea vomiting and abdominal pain. The abdominal pain has worsened despite being on narcotics.  No prior to intestinal or other GI's history.  Patient was seen in the ER with a evaluation showing partial small bowel obstruction due to abdominal worsening carcinomatosis.  She was started with NG tube for conservative measures and admitted under hospitalist service.  Assessment & Plan:   Principal Problem:   SBO (small bowel obstruction) (HCC) Active Problems:   Fallopian tube cancer, carcinoma (HCC)   Essential hypertension   CKD (chronic kidney disease), stage III   #1 partial small bowel obstruction:  Patient was treated with NG tube decompression. She is now having bowel movements and vomiting has resolved. NG tube was clamped and she was started on clear liquids. She tolerated this well and NG tube was discontinued. Clinically, her obstruction is better.  #2 metastatic fallopian tube cancer: Patient and family have elected to pursue comfort measures. She is currently on a morphine infusion and appears comfortable. Patient's wish is to return home with hospice services. Palliative care is following to assist in preparing patient for further disposition. Continue morphine infusion and add roxanol prn for breakthrough with the hopes of weaning off morphine infusion.  #3 hypertension:  Blood pressure fluctuating.  Unable to take p.o.  Will start on IV as needed hydralazine.  #4 chronic kidney disease stage III: At baseline.  Continue hydration.  #5 hyperlipidemia:  Statin not indicated in this  situation.  DVT prophylaxis: Heparin Code Status: DNR Family Communication: Discussed with daughter at the bedside. Disposition Plan: Discharge home with hospice services when appropriate arrangements have been made?  Estimated body mass index is 25.92 kg/m as calculated from the following:   Height as of 08/29/19: 5\' 4"  (1.626 m).   Weight as of this encounter: 68.5 kg.      Nutritional status:               Consultants:   Palliative care and oncology  Procedures:   None   Antimicrobials:   None   Subjective: Patient is currently on morphine infusion and resting comfortably. Tolerating clear liquids. She is having bowel movements.  Objective: Vitals:   09/01/19 2056 09/02/19 0011 09/02/19 0529 09/02/19 1238  BP:  (!) 144/83 137/79 106/73  Pulse:  89 77 88  Resp:  18 16 17   Temp: 98.3 F (36.8 C) 99 F (37.2 C) (!) 97.5 F (36.4 C) 98.9 F (37.2 C)  TempSrc: Oral Oral Oral Oral  SpO2: 95% 95% 95% 92%  Weight:   68.5 kg     Intake/Output Summary (Last 24 hours) at 09/02/2019 1804 Last data filed at 09/02/2019 0535 Gross per 24 hour  Intake 1715.83 ml  Output 250 ml  Net 1465.83 ml   Filed Weights   09/01/19 0137 09/02/19 0529  Weight: 69.3 kg 68.5 kg    Examination: General exam: Alert, awake, oriented x 3, NG tube in place Respiratory system: Clear to auscultation. Respiratory effort normal. Cardiovascular system:RRR. No murmurs, rubs, gallops. Gastrointestinal system: Abdomen is nondistended, soft and nontender. No organomegaly or masses felt. Normal bowel sounds heard. Central nervous system: Alert and  oriented. No focal neurological deficits. Extremities: No C/C/E, +pedal pulses Skin: No rashes, lesions or ulcers Psychiatry: Judgement and insight appear normal. Mood & affect appropriate.     Data Reviewed: I have personally reviewed following labs and imaging studies  CBC: Recent Labs  Lab 08/31/19 1538 09/01/19 0547  WBC  17.4* 8.7  HGB 11.5* 9.4*  HCT 35.7* 28.2*  MCV 108.8* 106.0*  PLT 322 0000000   Basic Metabolic Panel: Recent Labs  Lab 08/31/19 1538 09/01/19 0547  NA 138 139  K 4.5 4.0  CL 103 103  CO2 23 24  GLUCOSE 162* 140*  BUN 47* 35*  CREATININE 1.38* 1.23*  CALCIUM 9.2 8.4*   GFR: Estimated Creatinine Clearance: 33.5 mL/min (A) (by C-G formula based on SCr of 1.23 mg/dL (H)). Liver Function Tests: Recent Labs  Lab 08/31/19 1538 09/01/19 0547  AST 20 16  ALT 14 11  ALKPHOS 74 55  BILITOT 0.6 0.3  PROT 6.8 5.7*  ALBUMIN 3.1* 2.5*   Recent Labs  Lab 08/31/19 1538  LIPASE 16   No results for input(s): AMMONIA in the last 168 hours. Coagulation Profile: No results for input(s): INR, PROTIME in the last 168 hours. Cardiac Enzymes: No results for input(s): CKTOTAL, CKMB, CKMBINDEX, TROPONINI in the last 168 hours. BNP (last 3 results) No results for input(s): PROBNP in the last 8760 hours. HbA1C: No results for input(s): HGBA1C in the last 72 hours. CBG: No results for input(s): GLUCAP in the last 168 hours. Lipid Profile: No results for input(s): CHOL, HDL, LDLCALC, TRIG, CHOLHDL, LDLDIRECT in the last 72 hours. Thyroid Function Tests: No results for input(s): TSH, T4TOTAL, FREET4, T3FREE, THYROIDAB in the last 72 hours. Anemia Panel: No results for input(s): VITAMINB12, FOLATE, FERRITIN, TIBC, IRON, RETICCTPCT in the last 72 hours. Sepsis Labs: No results for input(s): PROCALCITON, LATICACIDVEN in the last 168 hours.  Recent Results (from the past 240 hour(s))  SARS CORONAVIRUS 2 (TAT 6-24 HRS) Nasopharyngeal Nasopharyngeal Swab     Status: None   Collection Time: 08/31/19 10:17 PM   Specimen: Nasopharyngeal Swab  Result Value Ref Range Status   SARS Coronavirus 2 NEGATIVE NEGATIVE Final    Comment: (NOTE) SARS-CoV-2 target nucleic acids are NOT DETECTED. The SARS-CoV-2 RNA is generally detectable in upper and lower respiratory specimens during the acute phase of  infection. Negative results do not preclude SARS-CoV-2 infection, do not rule out co-infections with other pathogens, and should not be used as the sole basis for treatment or other patient management decisions. Negative results must be combined with clinical observations, patient history, and epidemiological information. The expected result is Negative. Fact Sheet for Patients: SugarRoll.be Fact Sheet for Healthcare Providers: https://www.woods-mathews.com/ This test is not yet approved or cleared by the [place] FDA and  has been authorized for detection and/or diagnosis of SARS-CoV-2 by FDA under an Emergency Use Authorization (EUA). This EUA will remain  in effect (meaning this test can be used) for the duration of the COVID-19 declaration under Section 56 4(b)(1) of the Act, 21 U.S.C. section 360bbb-3(b)(1), unless the authorization is terminated or revoked sooner. Performed at [facility], [place] [address] [place], [place] [address]       Radiology Studies: Dg Abd Portable 1 View  Result Date: 08/31/2019 CLINICAL DATA:  Nasogastric tube placement EXAM: PORTABLE ABDOMEN - 1 VIEW COMPARISON:  Portable exam 2326 hours compared to CT abdomen and pelvis 08/31/2019 FINDINGS: Nasogastric tube tip projects over gastric antrum. Atelectasis versus consolidation  LEFT lower lobe. Paucity of bowel gas. Bones demineralized. IMPRESSION: Tip of nasogastric tube projects over gastric antrum. Electronically Signed   By: Lavonia Dana M.D.   On: 08/31/2019 23:49    Scheduled Meds:  heparin  5,000 Units Subcutaneous Q8H   Continuous Infusions:  morphine 3 mg/hr (09/02/19 1544)     LOS: 2 days   Time spent: 35 minutes   Kathie Dike, MD [facility]  09/02/2019, 6:04 PM   To contact the attending provider between 7A-7P or the covering provider during after hours 7P-7A, please log into the web site [email or web address]

## 2019-09-02 NOTE — Progress Notes (Signed)
Pt's daughter refused bed alarm to be turned on, she has been educated on the risks associated with falls

## 2019-09-02 NOTE — Telephone Encounter (Signed)
Amber with [facility]/hospice called and left a message. Will Dr. Alvy Bimler be the attending when she is d/ced from hospital tomorrow?

## 2019-09-02 NOTE — Progress Notes (Addendum)
RN called to patient room by daughter, stating patient pain has started again since morphine rate was changed from 5ML/hr to 3ML/hr and she wanted her to have the break through pain med that was ordered. RN went to room to assessed patient, she rated pain at 2/10 and said she could wait, but daughter wanted her to have it before the pain gets worse, by the time RN went to pyxis got the med and came back to room, patient decided that she wanted to wait until her pain got worse than what it currently was. RN encouraged patient to called her right away if pain increase. Patient is fine, sipping on ginger ale.  RN check back with patient if she wanted morphine, but patient said no, her pain had decrease. Refused morphine, wasted in stericycle, witnessed by [employer].

## 2019-09-03 MED ORDER — BISACODYL 10 MG RE SUPP
10.0000 mg | Freq: Every day | RECTAL | Status: DC | PRN
  Administered 2019-09-04: 10 mg via RECTAL
  Filled 2019-09-03: qty 1

## 2019-09-03 MED ORDER — POLYETHYLENE GLYCOL 3350 17 G PO PACK: 17.0000 g | PACK | Freq: Every day | ORAL | Status: DC

## 2019-09-03 MED ORDER — MORPHINE SULFATE (CONCENTRATE) 10 MG/0.5ML PO SOLN: 5.0000 mg | ORAL | 0 refills | Status: AC | PRN

## 2019-09-03 MED ORDER — BISACODYL 10 MG RE SUPP
10.0000 mg | RECTAL | Status: AC
  Administered 2019-09-03: 10 mg via RECTAL
  Filled 2019-09-03: qty 1

## 2019-09-03 MED ORDER — SODIUM CHLORIDE 0.9% FLUSH
10.0000 mL | INTRAVENOUS | Status: DC | PRN
  Administered 2019-09-03: 10 mL
  Filled 2019-09-03: qty 40

## 2019-09-03 MED ORDER — MORPHINE SULFATE (CONCENTRATE) 10 MG/0.5ML PO SOLN
5.0000 mg | ORAL | Status: DC | PRN
  Administered 2019-09-03 – 2019-09-04 (×9): 10 mg via ORAL
  Filled 2019-09-03 (×9): qty 0.5

## 2019-09-03 MED ORDER — CHLORHEXIDINE GLUCONATE CLOTH 2 % EX PADS
6.0000 | MEDICATED_PAD | Freq: Every day | CUTANEOUS | Status: DC
  Administered 2019-09-04: 6 via TOPICAL

## 2019-09-03 MED ORDER — MORPHINE SULFATE (CONCENTRATE) 10 MG/0.5ML PO SOLN: 10.0000 mg | ORAL | Status: DC | PRN

## 2019-09-03 MED ORDER — MORPHINE SULFATE (CONCENTRATE) 10 MG/0.5ML PO SOLN
5.0000 mg | ORAL | Status: DC | PRN
  Administered 2019-09-03 (×2): 5 mg via ORAL
  Filled 2019-09-03 (×2): qty 0.5

## 2019-09-03 MED ORDER — HEPARIN SOD (PORK) LOCK FLUSH 100 UNIT/ML IV SOLN
500.0000 [IU] | INTRAVENOUS | Status: AC | PRN
  Administered 2019-09-03: 500 [IU]
  Filled 2019-09-03: qty 5

## 2019-09-03 NOTE — TOC Progression Note (Signed)
Transition of Care [facility]) - Progression Note    Patient Details  Name: Lisa Moyer MRN: [id number] Date of Birth: 1934-11-20  Transition of Care [facility]) CM/SW Contact  Zenon Mayo, RN Phone Number: 09/03/2019, 1:11 PM  Clinical Narrative:    NCM spoke with patient daughter to confirm home with hospice with [facility], she states that her mother is in so much pain and that her mother will not be going home today, that they will be staying over night.  NCM informed MD and Staff RN.   Expected Discharge Plan: Home w Hospice Care Barriers to Discharge: No Barriers Identified  Expected Discharge Plan and Services Expected Discharge Plan: [place]   Discharge Planning Services: CM Consult Post Acute Care Choice: Hospice Living arrangements for the past 2 months: Apartment Expected Discharge Date: 09/03/19               DME Arranged: (NA)         HH Arranged: RN HH Agency: Hospice and [place] Date HH Agency Contacted: 09/02/19 Time [place]: 1932 Representative spoke with at [place]: [place] ([place]) Interventions    Readmission Risk Interventions Readmission Risk Prevention Plan 09/02/2019  Transportation Screening Complete  PCP or Specialist Appt within 3-5 Days Complete  HRI or [place] Complete  Social Work Consult for [place] Planning/Counseling Complete  Palliative Care Screening Complete  Medication Review Press photographer) Complete  Some recent data might be hidden

## 2019-09-03 NOTE — Progress Notes (Signed)
PROGRESS NOTE    Lisa Moyer  [id number] DOB: [birth year] DOA: 08/31/2019 PCP: No primary care provider on file.   Brief Narrative:  Lisa Moyer is a 84 y.o. female with medical history significant of  metastatic ovarian and fallopian tube cancer , peritoneal carcinomatosis, hyperlipidemia and hypertension who is currently on palliative care presented with persistent nausea vomiting and abdominal pain. The abdominal pain has worsened despite being on narcotics.  No prior to intestinal or other GI's history.  Patient was seen in the ER with a evaluation showing partial small bowel obstruction due to abdominal worsening carcinomatosis.  She was started with NG tube for conservative measures and admitted under hospitalist service.  Assessment & Plan:   Principal Problem:   SBO (small bowel obstruction) (HCC) Active Problems:   Fallopian tube cancer, carcinoma (HCC)   Essential hypertension   CKD (chronic kidney disease), stage III   Palliative care by specialist   DNR (do not resuscitate)   #1 partial small bowel obstruction:  Patient was treated with NG tube decompression. She is now having bowel movements and vomiting has resolved. NG tube discontinued and she is tolerating liquids. Clinically, her obstruction is better.  #2 metastatic fallopian tube cancer: Patient and family have elected to pursue comfort measures.  Patient continues to have significant abdominal pain related to her cancer.  She initially started on a morphine infusion with good results.  Palliative care met with the patient and daughter to discuss further goals of care.  Patient expressed her wishes to return home on discharge.  Efforts have been made to transition the patient to Roxanol for pain management.  If patient remains stable on Roxanol for the next 24 hours, anticipate discharge home tomorrow.  If her pain recurs, she may need to be started on a morphine drip and discharge home with morphine  drip.  #3 hypertension:  She is on multiple antihypertensives at home which were held.  Blood pressure is currently stable.  #4 chronic kidney disease stage III: At baseline.  Continue hydration.  #5 hyperlipidemia:  Statin not indicated in this situation.  DVT prophylaxis: Heparin Code Status: DNR Family Communication: Discussed with daughter at the bedside. Disposition Plan: Discharge home with hospice services when appropriate arrangements have been made?  Estimated body mass index is 25.99 kg/m as calculated from the following:   Height as of 08/29/19: _0  (1.626 m).   Weight as of this encounter: 68.7 kg.    Consultants:   Palliative care and oncology  Procedures:   None   Antimicrobials:   None   Subjective: Patient is passing flatus, no bowel movement today. Last bowel movement yesterday.  No nausea or vomiting.  Pain is reasonably controlled on morphine infusion.  Objective: Vitals:   09/02/19 1238 09/02/19 1945 09/03/19 0512 09/03/19 1544  BP: 106/73 131/80 (!) 145/75 (!) 148/88  Pulse: 88 85 80 83  Resp: _1 Temp: 98.9 F (37.2 C) 99.3 F (37.4 C) 98.7 F (37.1 C) (!) 97.4 F (36.3 C)  TempSrc: Oral Oral Oral Oral  SpO2: 92% 93% 93% 95%  Weight:   68.7 kg     Intake/Output Summary (Last 24 hours) at 09/03/2019 1905 Last data filed at 09/03/2019 1554 Gross per 24 hour  Intake 730 ml  Output 200 ml  Net 530 ml   Filed Weights   09/01/19 0137 09/02/19 0529 09/03/19 0512  Weight: 69.3 kg 68.5 kg 68.7 kg    Examination: General  exam: Alert, awake, oriented x 3 Respiratory system: Clear to auscultation. Respiratory effort normal. Cardiovascular system:RRR. No murmurs, rubs, gallops. Gastrointestinal system: Abdomen is nondistended, soft. No organomegaly or masses felt. Normal bowel sounds heard. Central nervous system: Alert and oriented. No focal neurological deficits. Extremities: No C/C/E, +pedal pulses Skin: No rashes, lesions  or ulcers Psychiatry: Judgement and insight appear normal. Mood & affect appropriate.   Data Reviewed: I have personally reviewed following labs and imaging studies  CBC: Recent Labs  Lab 08/31/19 1538 09/01/19 0547  WBC 17.4* 8.7  HGB 11.5* 9.4*  HCT 35.7* 28.2*  MCV 108.8* 106.0*  PLT 322 128   Basic Metabolic Panel: Recent Labs  Lab 08/31/19 1538 09/01/19 0547  NA 138 139  K 4.5 4.0  CL 103 103  CO2 23 24  GLUCOSE 162* 140*  BUN 47* 35*  CREATININE 1.38* 1.23*  CALCIUM 9.2 8.4*   GFR: Estimated Creatinine Clearance: 33.6 mL/min (A) (by C-G formula based on SCr of 1.23 mg/dL (H)). Liver Function Tests: Recent Labs  Lab 08/31/19 1538 09/01/19 0547  AST 20 16  ALT 14 11  ALKPHOS 74 55  BILITOT 0.6 0.3  PROT 6.8 5.7*  ALBUMIN 3.1* 2.5*   Recent Labs  Lab 08/31/19 1538  LIPASE 16   No results for input(s): AMMONIA in the last 168 hours. Coagulation Profile: No results for input(s): INR, PROTIME in the last 168 hours. Cardiac Enzymes: No results for input(s): CKTOTAL, CKMB, CKMBINDEX, TROPONINI in the last 168 hours. BNP (last 3 results) No results for input(s): PROBNP in the last 8760 hours. HbA1C: No results for input(s): HGBA1C in the last 72 hours. CBG: No results for input(s): GLUCAP in the last 168 hours. Lipid Profile: No results for input(s): CHOL, HDL, LDLCALC, TRIG, CHOLHDL, LDLDIRECT in the last 72 hours. Thyroid Function Tests: No results for input(s): TSH, T4TOTAL, FREET4, T3FREE, THYROIDAB in the last 72 hours. Anemia Panel: No results for input(s): VITAMINB12, FOLATE, FERRITIN, TIBC, IRON, RETICCTPCT in the last 72 hours. Sepsis Labs: No results for input(s): PROCALCITON, LATICACIDVEN in the last 168 hours.  Recent Results (from the past 240 hour(s))  SARS CORONAVIRUS 2 (TAT 6-24 HRS) Nasopharyngeal Nasopharyngeal Swab     Status: None   Collection Time: 08/31/19 10:17 PM   Specimen: Nasopharyngeal Swab  Result Value Ref Range  Status   SARS Coronavirus 2 NEGATIVE NEGATIVE Final    Comment: (NOTE) SARS-CoV-2 target nucleic acids are NOT DETECTED. The SARS-CoV-2 RNA is generally detectable in upper and lower respiratory specimens during the acute phase of infection. Negative results do not preclude SARS-CoV-2 infection, do not rule out co-infections with other pathogens, and should not be used as the sole basis for treatment or other patient management decisions. Negative results must be combined with clinical observations, patient history, and epidemiological information. The expected result is Negative. Fact Sheet for Patients: SugarRoll.be Fact Sheet for Healthcare Providers: https://www.woods-mathews.com/ This test is not yet approved or cleared by the [place] FDA and  has been authorized for detection and/or diagnosis of SARS-CoV-2 by FDA under an Emergency Use Authorization (EUA). This EUA will remain  in effect (meaning this test can be used) for the duration of the COVID-19 declaration under Section 56 4(b)(1) of the Act, 21 U.S.C. section 360bbb-3(b)(1), unless the authorization is terminated or revoked sooner. Performed at [facility], [place] [address], [place], [place] [address]       Radiology Studies: No results found.  Scheduled Meds:  Chlorhexidine Gluconate Cloth  6 each Topical Daily   Continuous Infusions:    LOS: 3 days   Time spent: 35 minutes   Kathie Dike, MD [facility]  09/03/2019, 7:05 PM   To contact the attending provider between 7A-7P or the covering provider during after hours 7P-7A, please log into the web site [email or web address]

## 2019-09-03 NOTE — Care Management Important Message (Signed)
Important Message  Patient Details  Name: Lisa Moyer MRN: [id number] Date of Birth: Nov 09, 1934   Medicare Important Message Given:  Yes     Shelda Altes 09/03/2019, 3:11 PM

## 2019-09-03 NOTE — Telephone Encounter (Signed)
Called and given below message to [employer]. She verbalized understanding.

## 2019-09-03 NOTE — Progress Notes (Signed)
Manufacturing engineer [facility])  Spoke with daughter to confirm discharge plan.  Additional DME requested and ordered.  Does not need the DME to discharge home.  Daughter to take pt home POV.  ACC RN scheduled to be at home at [employer].  Please assure pt has any comfort scripts that may be needed available so there is no lapse in pt comfort prior to hospice services beginning. They would like to use 24 hour [facility] on [address].  Thank you, Venia Carbon RN, BSN, [place] [facility] Liaison (in [place]) [phone]

## 2019-09-03 NOTE — Progress Notes (Signed)
Orders written to d/c morphine gtt, RN wasted the amount that was left in the bag 22ml, verified with [address]. Wasted in the stericycle.

## 2019-09-03 NOTE — Progress Notes (Signed)
  Patient ID: Lisa Moyer, female   DOB: 1935-04-19, 84 y.o.   MRN: [id number]  This NP visited patient at the bedside as a follow up to  yesterday's [place] and to meet with daughter/[name] for continued conversation regarding current medical situation and transitions of care.  Patient is awake and alert and verbalizing her strong desire to discharge home today.  They are open to hospice services in the home.  Daughter has many questions and concerns regarding hospice benefit and anticipatory care needs once patient is transitioned home. All questions and concerns addressed to the best of my ability.  Plan of care: - comfort and dignity are the main focus of care, no further life prolonging measures - hospice services on discharge- hopeful for today- spoke to [facility] liaison and she spoke directly with daughter - transition from IV morphine gtt to Roxanol ( 5-10 mg po/sl every one hr prn) - diet as tolerated   Discussion regarding natural trajectory and expectations at EOL was had.  Daughter struggles with the unknowns of this disease process, it is hard to say when symptoms related to bowel obstruction may arise.  Assured her of hospice's expertise in symptom management in the home   Discussed with Dr Roderic Palau and  Plan was to discharge today, however family became concerned that pain was not well managed and requested to wait until tomorrow for transition home, "making sure the pain medication prescribed will work"  Discussed with Dr Roderic Palau, bedside RN and hospice liason--plan will now be for discharge home tomorrow.  I again returned to bedside to support patient and daughter in navigating her treatment plan.  Emotional support offered.  Continue with Roxanol.  I anticipate that this will offer effective pain control.    If however pain becomes unmanageable thru the night, recommend restating Morphine gtt via PAC at 2 mg/hr.  Patient will be able to discharge  home on Morphine gtt  under hospice benefit  Questions and concerns addressed   Discussed with Dr Roderic Palau, Venia Carbon RN/hospice liason and bedside RN  Total time spent on the unit was 60 minutes  Greater than 50% of the time was spent in counseling and coordination of care  Wadie Lessen NP  Palliative Medicine Team Team Phone # [phone] Pager [phone]

## 2019-09-03 NOTE — Progress Notes (Signed)
[facility]  Spoke with daughter, she is tearful and states she cannot take her mother home today.  She is fearful that her mothers pain cannot be managed without IV analgesics.  Updated PMT with discussions and concerns from daughter.   Will f/u in am to see if pt will d/c tomorrow.  Thank you, Venia Carbon  [phone]

## 2019-09-03 NOTE — Progress Notes (Signed)
Pt's IV site was leaking/infiltrated, The porta-cath to the right chest was activated. Pt denies any discomfort or distress. Pt and her daughter has been educated on using CHG for infection prevention. The Morphine drip left to the bag was 50 ml and was wasted in stericycle by 2 nurses - Jenetta Downer  and Jeani Hawking.

## 2019-09-03 NOTE — Progress Notes (Signed)
Patient received morphine 10 mg for a pain of 5 on a 0-10 pain scale, when RN reassessed patient, she said that her pain was better, less than 5, but patient daughter is not satisfied with the results, she keeps saying when her mom was on the gtt her pain control was better. RN has paged palliative nurse Stanton Kidney multiple times today concerning the above, and each time she has tried to address patient pain medication needs. Per daughter, she thinks her mom needs to go back on the morphine gtt and possibly d/c home on it.

## 2019-09-03 NOTE — Telephone Encounter (Signed)
Sure yes

## 2019-09-04 ENCOUNTER — Telehealth: Payer: Self-pay | Admitting: Hematology and Oncology

## 2019-09-04 MED ORDER — MORPHINE SULFATE 2 MG/ML IV SOLN: INTRAVENOUS | 0 refills | Status: AC

## 2019-09-04 MED FILL — MORPHINE SULF 100 MG/5 ML S: 100 | 10 days supply | Qty: 60 | Fill #0

## 2019-09-04 NOTE — Discharge Summary (Signed)
Physician Discharge Summary  Lisa Moyer [id number] DOB: Sep 16, 1935 DOA: 08/31/2019  PCP: No primary care provider on file.  Admit date: 08/31/2019 Discharge date: 09/04/2019  Admitted From: Home Disposition: Home with hospice  Recommendations for Outpatient Follow-up:  As per home hospice plan Home Health: Home hospice Equipment/Devices: DME is as per home hospice  Discharge Condition: Fair CODE STATUS: DNR/DNI comfort care Diet recommendation: Regular diet, small portions, comfort feeding.  Discharge summary: 84 year old female with history of metastatic ovarian and fallopian tube cancer, peritoneal carcinomatosis, hyperlipidemia and hypertension presented to the hospital with persistent nausea, vomiting and abdominal pain.  Abdominal pain worsened despite being on narcotics at home.  She was seen in the emergency room, had evidence of partial small bowel obstruction, NG tube was inserted and admitted to the hospital for symptom management.  With conservative management patient started having bowel movements, started on clear liquid diet and tolerating well.  This is probably due to ileus, peritoneal carcinomatosis.  Going home with hospice.  Small frequent meals, liquids, symptomatic treatment.  If has recurrent obstruction, she may benefit with palliative NG tube insertion that will be taken care of by hospice at home.  Metastatic fallopian tube cancer: Patient and family elected to pursue comfort measures and hospice care at home and dying at home.  Try different regimen and today pain is stabilized on morphine by mouth.  She also has a port.  Seen by hospice.  Will be admitted to hospice when she reaches home.  She may potentially need IV morphine or inpatient hospice down the road.  Patient does not need to be on multiple blood pressure medications, she will not benefit with a statin and they were all discontinued.   Discharge Diagnoses:  Principal Problem:   SBO  (small bowel obstruction) (HCC) Active Problems:   Fallopian tube cancer, carcinoma (HCC)   Essential hypertension   CKD (chronic kidney disease), stage III   Palliative care by specialist   DNR (do not resuscitate)    Discharge Instructions  Discharge Instructions    Diet - low sodium heart healthy   Complete by: As directed    Diet general   Complete by: As directed    Frequent small meals   Increase activity slowly   Complete by: As directed    Increase activity slowly   Complete by: As directed      Allergies as of 09/04/2019      Reactions   Latex Other (See Comments)   She has a sensitivity to [facility] so prior drs told her to avoid latex and listed it as an allergy      Medication List    STOP taking these medications   amLODipine 5 MG tablet Commonly known as: NORVASC   atenolol 25 MG tablet Commonly known as: TENORMIN   dexamethasone 2 MG tablet Commonly known as: DECADRON   losartan 100 MG tablet Commonly known as: COZAAR   morphine 30 MG 12 hr tablet Commonly known as: MS CONTIN Replaced by: morphine CONCENTRATE 10 MG/0.5ML Soln concentrated solution   morphine 30 MG tablet Commonly known as: MSIR   polyethylene glycol 17 g packet Commonly known as: MIRALAX / GLYCOLAX     TAKE these medications   acetaminophen 500 MG tablet Commonly known as: TYLENOL Take 1,000 mg by mouth every 6 (six) hours as needed for moderate pain.   levothyroxine 25 MCG tablet Commonly known as: Synthroid Take 1 tablet (25 mcg total) by mouth daily before breakfast.  morphine CONCENTRATE 10 MG/0.5ML Soln concentrated solution Place 0.25 mLs (5 mg total) under the tongue every hour as needed for moderate pain or shortness of breath. Replaces: morphine 30 MG 12 hr tablet   ondansetron 8 MG disintegrating tablet Commonly known as: Zofran ODT Take 1 tablet (8 mg total) by mouth every 8 (eight) hours as needed for nausea or vomiting.   senna 8.6 MG tablet Commonly  known as: SENOKOT Take 1 tablet by mouth daily.       Allergies  Allergen Reactions  . Latex Other (See Comments)    She has a sensitivity to [facility] so prior drs told her to avoid latex and listed it as an allergy    Consultations:  Palliative medicine.  Hospice   Procedures/Studies: Ct Abdomen Pelvis W Contrast  Result Date: 08/31/2019 CLINICAL DATA:  Right-sided abdominal pain, abdominal distention, and nausea and vomiting for 2 days. Metastatic fallopian tube carcinoma. EXAM: CT ABDOMEN AND PELVIS WITH CONTRAST TECHNIQUE: Multidetector CT imaging of the abdomen and pelvis was performed using the standard protocol following bolus administration of intravenous contrast. CONTRAST:  66mL OMNIPAQUE IOHEXOL 300 MG/ML  SOLN COMPARISON:  07/05/2019 FINDINGS: Lower Chest: New small bilateral pleural effusions. Hepatobiliary: No hepatic masses identified. Prior cholecystectomy. No evidence of biliary obstruction. Pancreas:  No mass or inflammatory changes. Spleen: Within normal limits in size and appearance. Adrenals/Urinary Tract: No masses identified. No evidence of hydronephrosis. Stomach/Bowel: Mild dilatation of proximal small bowel is seen within the left upper and lower quadrants, with transition point in the left lower quadrant, consistent with partial small bowel obstruction. Diverticulosis is seen mainly involving the descending and sigmoid colon, however there is no evidence of diverticulitis. Vascular/Lymphatic: Stable mildly enlarged left paraaortic and left external iliac lymph nodes, largest measuring 11 mm on image 65/3. No new or increased lymphadenopathy identified. Aortic atherosclerosis. No abdominal aortic aneurysm. Reproductive: Prior hysterectomy noted. Adnexal regions are unremarkable in appearance. Increased diffuse mesenteric soft tissue stranding, and peritoneal contrast enhancement is seen as well as new mild ascites. This is consistent with increased peritoneal  carcinomatosis. Other:  None. Musculoskeletal:  No suspicious bone lesions identified. IMPRESSION: Increased peritoneal carcinomatosis and mild ascites since prior exam. Partial small bowel obstruction, with transition point in left lower quadrant, likely due to peritoneal carcinomatosis. Stable mildly enlarged left paraaortic and external iliac lymph nodes. Colonic diverticulosis, without radiographic evidence of diverticulitis. New small bilateral pleural effusions. Electronically Signed   By: Marlaine Hind M.D.   On: 08/31/2019 17:05   Dg Abd Portable 1 View  Result Date: 08/31/2019 CLINICAL DATA:  Nasogastric tube placement EXAM: PORTABLE ABDOMEN - 1 VIEW COMPARISON:  Portable exam 2326 hours compared to CT abdomen and pelvis 08/31/2019 FINDINGS: Nasogastric tube tip projects over gastric antrum. Atelectasis versus consolidation LEFT lower lobe. Paucity of bowel gas. Bones demineralized. IMPRESSION: Tip of nasogastric tube projects over gastric antrum. Electronically Signed   By: Lavonia Dana M.D.   On: 08/31/2019 23:49       Subjective: Patient seen and examined.  No overnight events.  She had oral morphine given with control of pain. Patient's daughter and palliative care team at the bedside.  Patient herself is looking forward to go home to be with her family and other daughters.   Discharge Exam: Vitals:   09/03/19 1544 09/04/19 0433  BP: (!) 148/88 139/87  Pulse: 83 83  Resp: 18 20  Temp: (!) 97.4 F (36.3 C) 98.6 F (37 C)  SpO2: 95% 95%  Vitals:   09/02/19 1945 09/03/19 0512 09/03/19 1544 09/04/19 0433  BP: 131/80 (!) 145/75 (!) 148/88 139/87  Pulse: 85 80 83 83  Resp: 20 18 18 20   Temp: 99.3 F (37.4 C) 98.7 F (37.1 C) (!) 97.4 F (36.3 C) 98.6 F (37 C)  TempSrc: Oral Oral Oral Oral  SpO2: 93% 93% 95% 95%  Weight:  68.7 kg  67.7 kg    General: Pt is alert, awake, not in acute distress, not in any distress.  She is on room air. Cardiovascular: RRR, S1/S2 +, no  rubs, no gallops Respiratory: CTA bilaterally, no wheezing, no rhonchi, has a port that is nontender. Abdominal: Soft, ND, bowel sounds +, mild tenderness but no rigidity or guarding. Extremities: no edema, no cyanosis    The results of significant diagnostics from this hospitalization (including imaging, microbiology, ancillary and laboratory) are listed below for reference.     Microbiology: Recent Results (from the past 240 hour(s))  SARS CORONAVIRUS 2 (TAT 6-24 HRS) Nasopharyngeal Nasopharyngeal Swab     Status: None   Collection Time: 08/31/19 10:17 PM   Specimen: Nasopharyngeal Swab  Result Value Ref Range Status   SARS Coronavirus 2 NEGATIVE NEGATIVE Final    Comment: (NOTE) SARS-CoV-2 target nucleic acids are NOT DETECTED. The SARS-CoV-2 RNA is generally detectable in upper and lower respiratory specimens during the acute phase of infection. Negative results do not preclude SARS-CoV-2 infection, do not rule out co-infections with other pathogens, and should not be used as the sole basis for treatment or other patient management decisions. Negative results must be combined with clinical observations, patient history, and epidemiological information. The expected result is Negative. Fact Sheet for Patients: SugarRoll.be Fact Sheet for Healthcare Providers: https://www.woods-mathews.com/ This test is not yet approved or cleared by the [place] FDA and  has been authorized for detection and/or diagnosis of SARS-CoV-2 by FDA under an Emergency Use Authorization (EUA). This EUA will remain  in effect (meaning this test can be used) for the duration of the COVID-19 declaration under Section 56 4(b)(1) of the Act, 21 U.S.C. section 360bbb-3(b)(1), unless the authorization is terminated or revoked sooner. Performed at [facility], [place] [address], [place], [place] [address]      Labs: BNP (last 3 results) No results for  input(s): BNP in the last 8760 hours. Basic Metabolic Panel: Recent Labs  Lab 08/31/19 1538 09/01/19 0547  NA 138 139  K 4.5 4.0  CL 103 103  CO2 23 24  GLUCOSE 162* 140*  BUN 47* 35*  CREATININE 1.38* 1.23*  CALCIUM 9.2 8.4*   Liver Function Tests: Recent Labs  Lab 08/31/19 1538 09/01/19 0547  AST 20 16  ALT 14 11  ALKPHOS 74 55  BILITOT 0.6 0.3  PROT 6.8 5.7*  ALBUMIN 3.1* 2.5*   Recent Labs  Lab 08/31/19 1538  LIPASE 16   No results for input(s): AMMONIA in the last 168 hours. CBC: Recent Labs  Lab 08/31/19 1538 09/01/19 0547  WBC 17.4* 8.7  HGB 11.5* 9.4*  HCT 35.7* 28.2*  MCV 108.8* 106.0*  PLT 322 246   Cardiac Enzymes: No results for input(s): CKTOTAL, CKMB, CKMBINDEX, TROPONINI in the last 168 hours. BNP: Invalid input(s): POCBNP CBG: No results for input(s): GLUCAP in the last 168 hours. D-Dimer No results for input(s): DDIMER in the last 72 hours. Hgb A1c No results for input(s): HGBA1C in the last 72 hours. Lipid Profile No results for input(s): CHOL, HDL, LDLCALC, TRIG,  CHOLHDL, LDLDIRECT in the last 72 hours. Thyroid function studies No results for input(s): TSH, T4TOTAL, T3FREE, THYROIDAB in the last 72 hours.  Invalid input(s): FREET3 Anemia work up No results for input(s): VITAMINB12, FOLATE, FERRITIN, TIBC, IRON, RETICCTPCT in the last 72 hours. Urinalysis    Component Value Date/Time   COLORURINE YELLOW 08/31/2019 1636   APPEARANCEUR CLEAR 08/31/2019 1636   LABSPEC 1.020 08/31/2019 1636   PHURINE 5.0 08/31/2019 1636   GLUCOSEU NEGATIVE 08/31/2019 1636   HGBUR NEGATIVE 08/31/2019 1636   BILIRUBINUR NEGATIVE 08/31/2019 1636   BILIRUBINUR negative 07/13/2018 1210   KETONESUR NEGATIVE 08/31/2019 1636   PROTEINUR NEGATIVE 08/31/2019 1636   UROBILINOGEN 0.2 07/13/2018 1210   NITRITE NEGATIVE 08/31/2019 1636   LEUKOCYTESUR NEGATIVE 08/31/2019 1636   Sepsis Labs Invalid input(s): PROCALCITONIN,  WBC,   LACTICIDVEN Microbiology Recent Results (from the past 240 hour(s))  SARS CORONAVIRUS 2 (TAT 6-24 HRS) Nasopharyngeal Nasopharyngeal Swab     Status: None   Collection Time: 08/31/19 10:17 PM   Specimen: Nasopharyngeal Swab  Result Value Ref Range Status   SARS Coronavirus 2 NEGATIVE NEGATIVE Final    Comment: (NOTE) SARS-CoV-2 target nucleic acids are NOT DETECTED. The SARS-CoV-2 RNA is generally detectable in upper and lower respiratory specimens during the acute phase of infection. Negative results do not preclude SARS-CoV-2 infection, do not rule out co-infections with other pathogens, and should not be used as the sole basis for treatment or other patient management decisions. Negative results must be combined with clinical observations, patient history, and epidemiological information. The expected result is Negative. Fact Sheet for Patients: SugarRoll.be Fact Sheet for Healthcare Providers: https://www.woods-mathews.com/ This test is not yet approved or cleared by the [place] FDA and  has been authorized for detection and/or diagnosis of SARS-CoV-2 by FDA under an Emergency Use Authorization (EUA). This EUA will remain  in effect (meaning this test can be used) for the duration of the COVID-19 declaration under Section 56 4(b)(1) of the Act, 21 U.S.C. section 360bbb-3(b)(1), unless the authorization is terminated or revoked sooner. Performed at [facility], [place] [address], [place],  [address]      Time coordinating discharge:  45 minutes  SIGNED:   Barb Merino, MD  [facility] 09/04/2019, 11:05 AM

## 2019-09-04 NOTE — Telephone Encounter (Signed)
Returned patient's phone call regarding cancelling an appointment, spoke with patient's daughter and 11/05 appointment has been cancelled. Sent provider a staff message regarding further information as to why.

## 2019-09-04 NOTE — Progress Notes (Signed)
  Patient ID: Lisa Moyer, female   DOB: 08/05/1935, 84 y.o.   MRN: [id number]  This NP visited patient at the bedside as a follow up for palliative medicine needs, emotional support and to meet with daughter/[name] for continued conversation regarding current medical situation and transitions of care.  Although patient is alert and awake and has ambulated to the bathroom this morning, utilizing the as needed Roxanol through the night,  conversation between the patient and her daughter leans towards their hope to transition to a continuous drip for home with hospice.  Patient is adamant about discharging home today.  After discussion with hospice liaison plan is to discharge home on the oral/Roxanol and when pump and medication is ready hospice will transition her to a continuous drip this evening.  Hospice liaison/Jennifer Talbot Grumbling I have spoken with my collaborating physician Dr. Micheline Rough and prescription has been written and is ready for pickup in order to facilitate initiation of drip tonight at home.  Continued conversation regarding transition of care at home with hospice benefit.  All questions and concerns addressed to the best of my ability.  Plan of care: - comfort and dignity are the main focus of care, no further life prolonging measures - hospice services on discharge- hopeful for today- spoke to [facility] liaison and she spoke directly with daughter -  Roxanol ( 5-10 mg po/sl every one hr prn) and then transition to IV continuous drip once she is home this evening with hospice benefit. - diet as tolerated   Discussion regarding natural trajectory and expectations at EOL was had.  Daughter struggles with the unknowns of this disease process, it is hard to say when symptoms related to bowel obstruction may arise.  Assured her of hospice's expertise in symptom management in the home   Created space and opportunity for patient to explore her thoughts  and feelings regarding her current medical situation.  Patient verbalizes her hope to be at home for her final days surrounded by her family's and her personal items that reflect her life.  "I have had a good life."  She tells me about all of her lovely photographs of her family that she will be surrounded by.   Questions and concerns addressed   Discussed with Dr Sloan Leiter, Venia Carbon RN/hospice liason and bedside RN, and my collaborating physician Dr. Micheline Rough  Total time spent on the unit was 60 minutes  Greater than 50% of the time was spent in counseling and coordination of care  Wadie Lessen NP  Palliative Medicine Team Team Phone # [phone] Pager [phone]

## 2019-09-04 NOTE — Progress Notes (Signed)
[facility]   Pt to d/c home today.  DME to be delivered by 12. Per daughter, she can go home POV and does not need O2 to transport home, she has been using O2 PRN for comfort.  Medications discussed in great detail and attempted to answer numerous questions as thoroughly as possible.   [facility] will be at the house tonight at [place] to begin services.  Thank you, Venia Carbon RN, BSN, [facility] Liaison  [phone]

## 2019-09-04 NOTE — TOC Transition Note (Addendum)
aTransition of Care [facility] - CM/SW Discharge Note   Patient Details  Name: Lisa Moyer MRN: [id number] Date of Birth: April 05, 1935  Transition of [facility]) CM/SW Contact:  Zenon Mayo, RN Phone Number: 09/04/2019, 12:37 PM   Clinical Narrative:    Patient for dc today, home with hospice with [facility].  NCM contacted [facility] she states RN will be there at 5 or 6 pm today, and patient will go home on liquid morphine today and then transition to morphine drip tomorrow. [facility] is taking care of the DME that patient will need at the home.  It supposed to be at the home by 12 noon today per [place]. Son will be transporting patient home.  Final next level of care: Home w Hospice Care Barriers to Discharge: No Barriers Identified   Patient Goals and CMS Choice Patient states their goals for this hospitalization and ongoing recovery are:: home wiht hospice CMS Medicare.gov Compare Post Acute Care list provided to:: Patient Represenative (must comment) Choice offered to / list presented to : Adult Children  Discharge Placement                       Discharge Plan and Services   Discharge Planning Services: CM Consult Post Acute Care Choice: Hospice          DME Arranged: (Hospice will arrange)         HH Arranged: RN [facility] Agency: Hospice and [place] Date [place]: 09/04/19 Time [place]: 1237 Representative spoke with at [place]: [place]) Interventions     Readmission Risk Interventions Readmission Risk Prevention Plan 09/02/2019  Transportation Screening Complete  PCP or Specialist Appt within 3-5 Days Complete  HRI or [place] Complete  Social Work Consult for [place] Planning/Counseling Complete  Palliative Care Screening Complete  Medication Review Press photographer) Complete  Some recent data might be hidden

## 2019-09-04 NOTE — Progress Notes (Addendum)
Pt has orders to be discharged. Discharge instructions given and pt has no additional questions at this time. Medication regimen reviewed and pt./daughter educated. Pt's daughter verbalized understanding and has no additional questions at this time. Prescription given to pt's daughter and she is aware to pick up the other prescription at [facility] outpatient pharmacy. Telemetry box removed. IV removed and site in good condition. Pt stable and waiting for transportation from son.

## 2019-09-05 ENCOUNTER — Other Ambulatory Visit: Payer: Medicare Other

## 2019-09-05 ENCOUNTER — Ambulatory Visit: Payer: Medicare Other | Admitting: Hematology and Oncology

## 2019-09-19 ENCOUNTER — Other Ambulatory Visit: Payer: Self-pay | Admitting: Hematology and Oncology

## 2019-10-01 DEATH — deceased

## 2020-10-25 IMAGING — CT CT ABD-PELV W/ CM
2 of 5 series · 15 of 46 positions shown, 17 images · IV contrast (OMNIPAQUE)
Comparison: 04/26/2019

CLINICAL DATA: Epithelial ovarian/fallopian tube/primary peritoneal
carcinoma.

EXAM:
CT ABDOMEN AND PELVIS WITH CONTRAST
TECHNIQUE: Multidetector CT imaging of the abdomen and pelvis was performed
using the standard protocol following bolus administration of
intravenous contrast.
CONTRAST:  100mL OMNIPAQUE IOHEXOL 300 MG/ML  SOLN

[Series 2: axial st · axial · 0.71mm/px · z∈[-404,-49]mm · 12 of 85 slices shown, 14 images]
[im 7/85  soft-tissue]
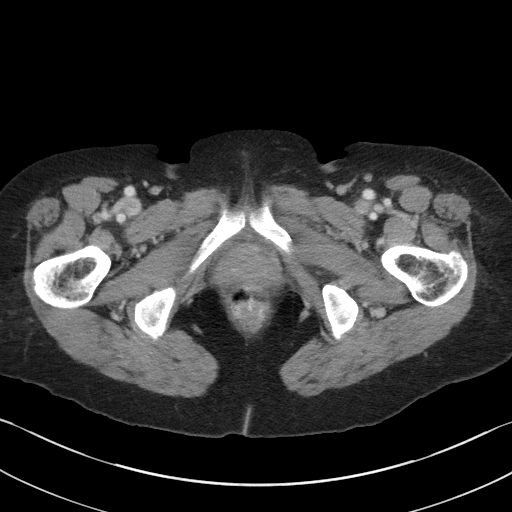
[im 7/85  bone]
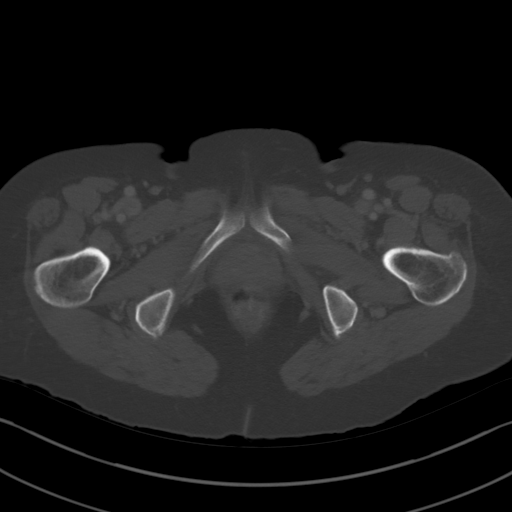
[im 13/85  soft-tissue]
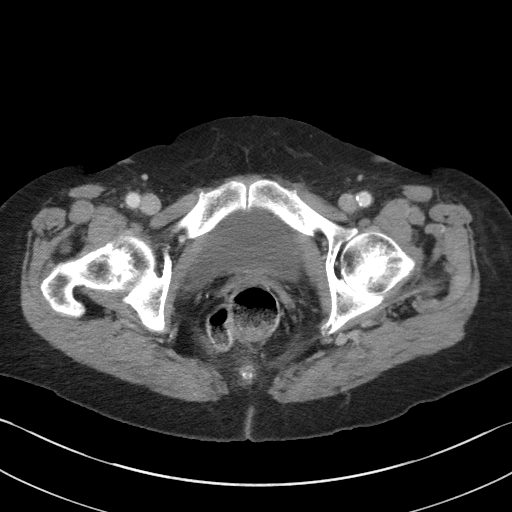
[im 20/85  soft-tissue]
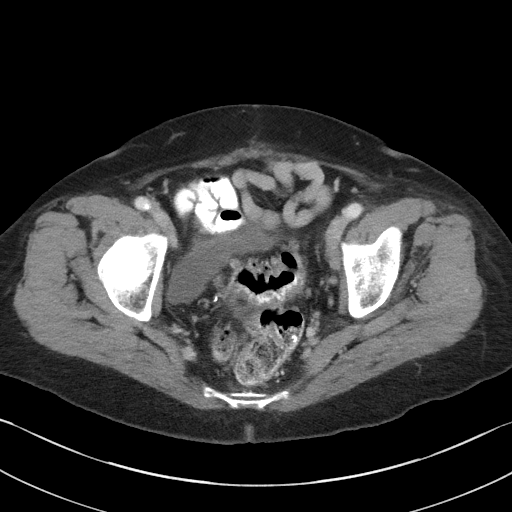
[im 26/85  soft-tissue]
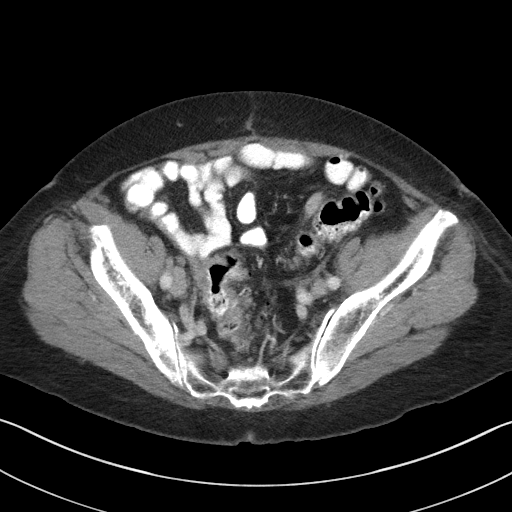
[im 33/85  soft-tissue]
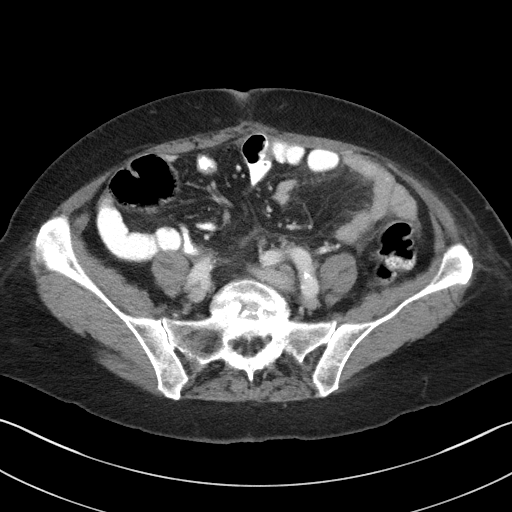
[im 39/85  soft-tissue]
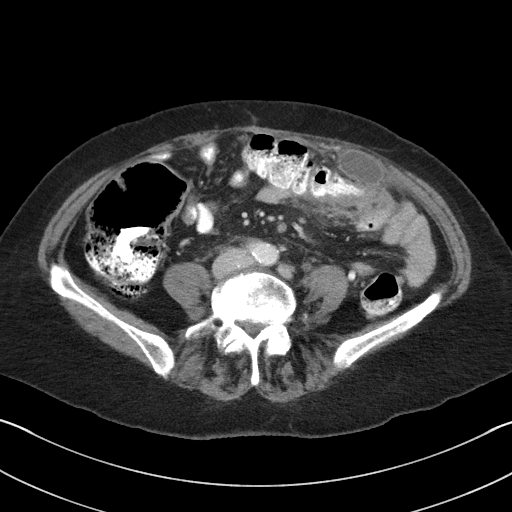
[im 46/85  soft-tissue]
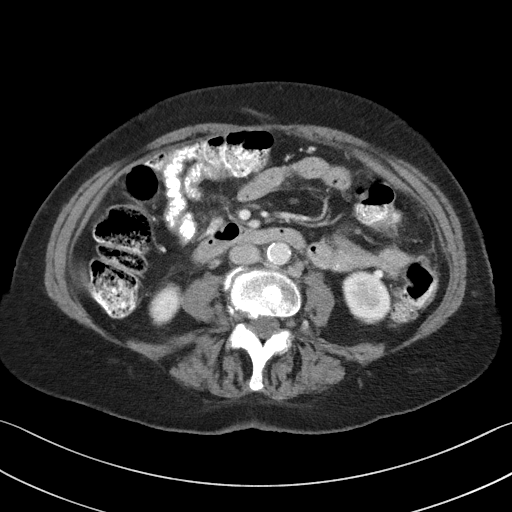
[im 52/85  soft-tissue]
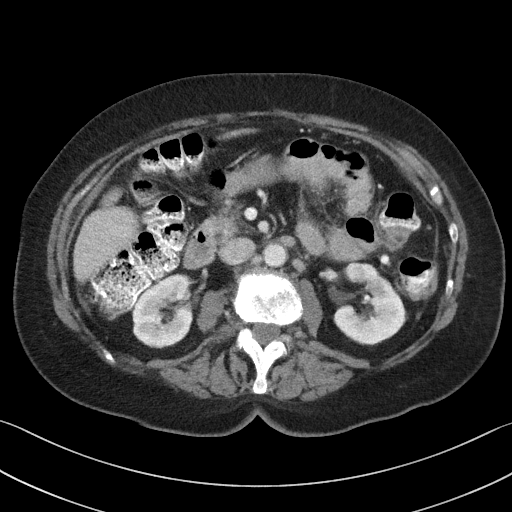
[im 59/85  soft-tissue]
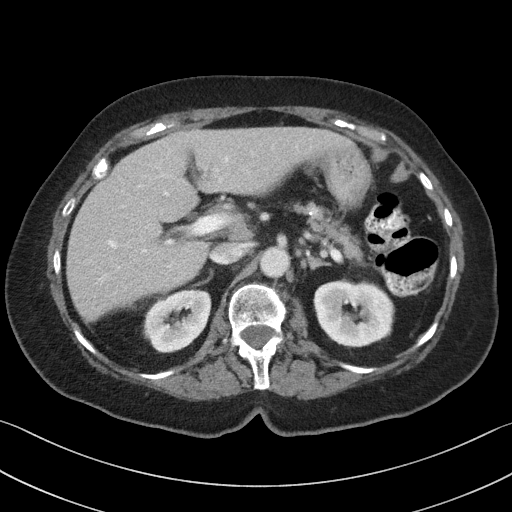
[im 59/85  bone]
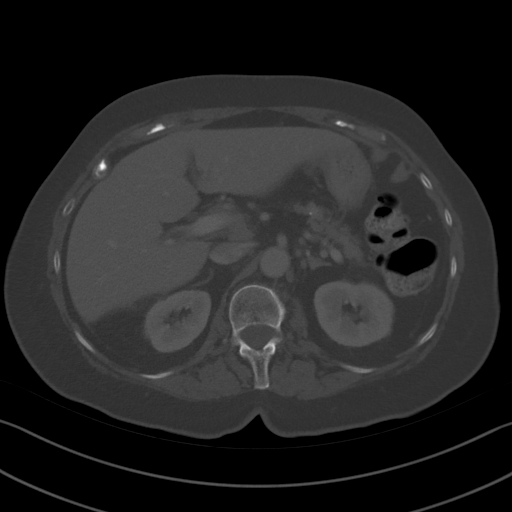
[im 65/85  soft-tissue]
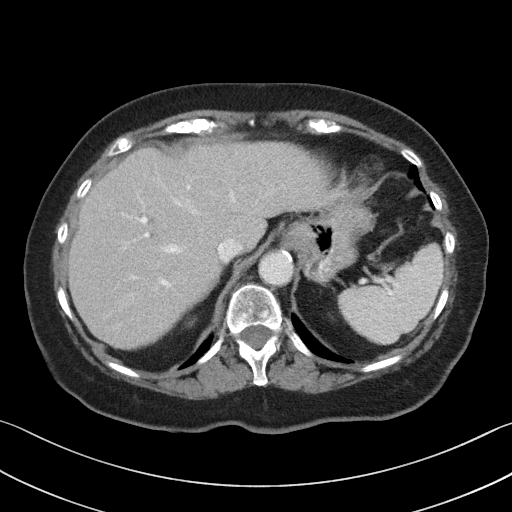
[im 72/85  soft-tissue]
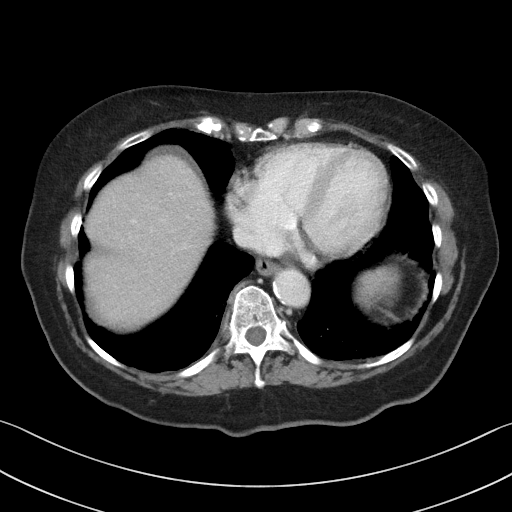
[im 78/85  soft-tissue]
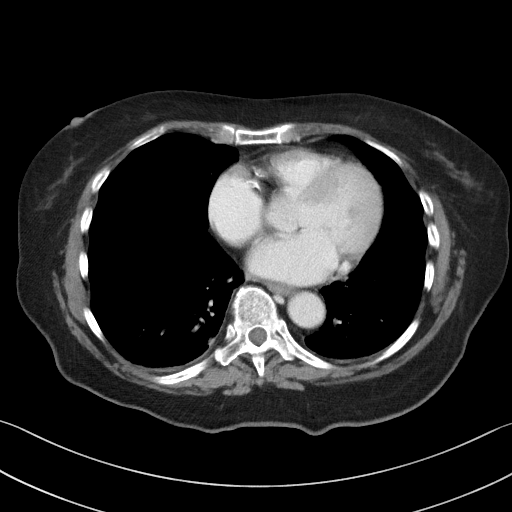

[Series 5: coronal st · coronal · 0.70mm/px · 3 of 86 slices shown]
[im 29/86  soft-tissue]
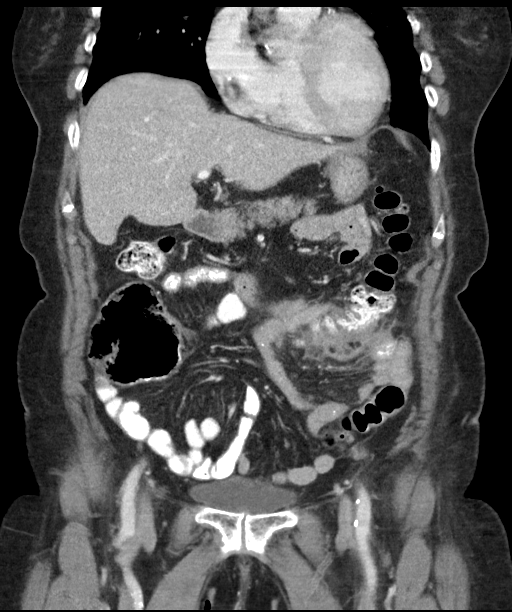
[im 38/86  soft-tissue]
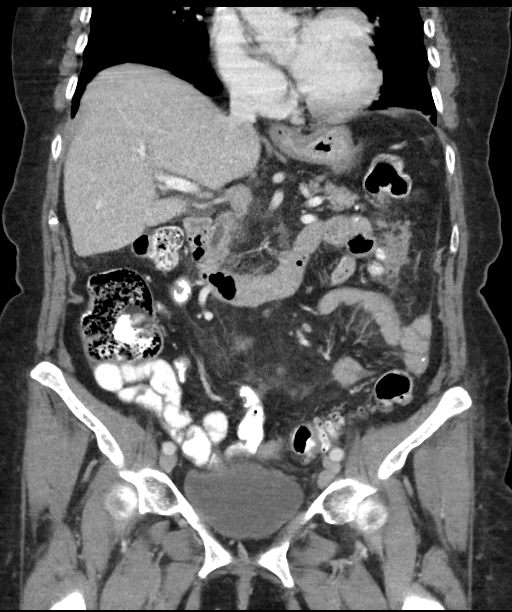
[im 48/86  soft-tissue]
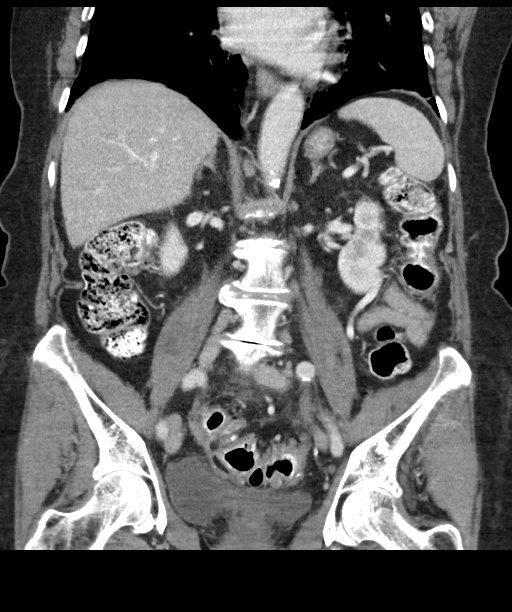

[15 of 46 positions shown; findings below may reference images not displayed]

FINDINGS: Lower chest: 4 mm left lower lobe lung nodule noted, unchanged.

Hepatobiliary: There is no focal liver abnormality identified.
Previous cholecystectomy. Common bile duct is upper limits of normal
in caliber measuring 6 mm.

Pancreas: Unremarkable. No pancreatic ductal dilatation or
surrounding inflammatory changes.

Spleen: Normal in size without focal abnormality.

Adrenals/Urinary Tract: Normal appearance of the adrenal glands.
Small, 3 mm right lower pole kidney stone. No kidney mass or
hydronephrosis identified at this time. The urinary bladder appears
normal.

Stomach/Bowel: Stomach is normal. No bowel wall inflammation or
distension. Distal colonic diverticulosis identified without acute
inflammation. New serosal thickening along the posterior wall of the
transverse colon, image 46/2.

Vascular/Lymphatic: Aortic atherosclerosis. No aneurysm. Increase in
size of retroperitoneal lymph nodes. Left periaortic node the up
measures 0.8 cm, image 32/2 new from previous exam. Left
retroperitoneal lymph node measures 0.7 cm, image 35/2 previously
0.3 cm. 1 cm left common iliac node noted, image 52/2. Previously
0.6 cm. 1.1 cm left external iliac node identified. Previously
cm.

Reproductive: Status post hysterectomy. No adnexal masses.

Other: Peritoneal carcinomatosis is identified. Most notable is in
the left lower quadrant of the abdomen surrounding the distal
transverse colon. Here, there is an area of soft tissue infiltration
measuring 5.1 x 2.2 cm with adjacent serosal involvement of the
colon, image 48/2. Anterior to the is a cystic lesion along the
undersurface of the ventral abdominal wall which measures 3.3 x
cm, image 48/2. Peritoneal thickening and nodularity along the left
pericolic gutter is new, image 42/5. Adjacent increased serosal soft
tissue is noted along the lateral aspect of the descending colon,
image 50/5. Within the left upper quadrant of the abdomen there is
an area of peritoneal soft tissue adjacent to the distal transverse
colon which measures 3.8 x 2.5 cm, image 38/5. Lymph node or
peritoneal nodule is identified in the left lower quadrant of the
abdomen measuring 1.0 x 1.6 cm, image [DATE]. Previously 0.7 x 1.4 cm.

Musculoskeletal: Scoliosis and degenerative disc disease identified
within the lumbar spine.
IMPRESSION: 1. Imaging findings consistent with recurrent peritoneal
carcinomatosis. This is most apparent within the left abdomen
involving the transverse colon and descending colon where there is
increased soft tissue, loculated fluid and colonic serosal soft
tissue thickening.
2. Increase in size of retroperitoneal and pelvic lymph nodes
compatible with metastatic adenopathy.
3. No evidence for bowel obstruction.

## 2023-09-20 NOTE — Telephone Encounter (Signed)
Telephone call
